# Patient Record
Sex: Female | Born: 1965 | Race: White | Hispanic: No | Marital: Married | Smoking: Never smoker
Health system: Southern US, Community
[De-identification: ages and names within clinical notes are randomized; demographics above are authoritative.]

## PROBLEM LIST (undated history)

## (undated) DIAGNOSIS — M21611 Bunion of right foot: Secondary | ICD-10-CM

## (undated) DIAGNOSIS — D649 Anemia, unspecified: Secondary | ICD-10-CM

## (undated) DIAGNOSIS — I10 Essential (primary) hypertension: Secondary | ICD-10-CM

## (undated) DIAGNOSIS — R7303 Prediabetes: Secondary | ICD-10-CM

## (undated) DIAGNOSIS — Z8632 Personal history of gestational diabetes: Secondary | ICD-10-CM

## (undated) DIAGNOSIS — K219 Gastro-esophageal reflux disease without esophagitis: Secondary | ICD-10-CM

## (undated) DIAGNOSIS — E66811 Obesity, class 1: Secondary | ICD-10-CM

## (undated) DIAGNOSIS — I8390 Asymptomatic varicose veins of unspecified lower extremity: Secondary | ICD-10-CM

## (undated) DIAGNOSIS — E669 Obesity, unspecified: Secondary | ICD-10-CM

## (undated) DIAGNOSIS — K8020 Calculus of gallbladder without cholecystitis without obstruction: Secondary | ICD-10-CM

## (undated) HISTORY — DX: Essential (primary) hypertension: I10

## (undated) HISTORY — DX: Calculus of gallbladder without cholecystitis without obstruction: K80.20

## (undated) HISTORY — PX: BREAST CYST ASPIRATION: SHX578

## (undated) HISTORY — DX: Personal history of gestational diabetes: Z86.32

## (undated) HISTORY — PX: TOTAL ABDOMINAL HYSTERECTOMY W/ BILATERAL SALPINGOOPHORECTOMY: SHX83

## (undated) HISTORY — PX: BUNIONECTOMY: SHX129

---

## 1999-08-27 ENCOUNTER — Other Ambulatory Visit: Admission: RE | Admit: 1999-08-27 | Discharge: 1999-08-27 | Payer: Self-pay | Admitting: Gynecology

## 2000-10-24 ENCOUNTER — Other Ambulatory Visit: Admission: RE | Admit: 2000-10-24 | Discharge: 2000-10-24 | Payer: Self-pay | Admitting: Gynecology

## 2003-11-03 ENCOUNTER — Other Ambulatory Visit: Admission: RE | Admit: 2003-11-03 | Discharge: 2003-11-03 | Payer: Self-pay | Admitting: Gynecology

## 2006-04-23 ENCOUNTER — Other Ambulatory Visit: Admission: RE | Admit: 2006-04-23 | Discharge: 2006-04-23 | Payer: Self-pay | Admitting: Gynecology

## 2009-03-15 ENCOUNTER — Ambulatory Visit: Payer: Self-pay | Admitting: General Practice

## 2009-05-10 ENCOUNTER — Ambulatory Visit: Payer: Self-pay | Admitting: General Surgery

## 2009-05-12 ENCOUNTER — Ambulatory Visit: Payer: Self-pay | Admitting: Gynecology

## 2009-05-12 ENCOUNTER — Other Ambulatory Visit: Admission: RE | Admit: 2009-05-12 | Discharge: 2009-05-12 | Payer: Self-pay | Admitting: Gynecology

## 2009-10-17 ENCOUNTER — Ambulatory Visit: Payer: Self-pay

## 2010-01-22 ENCOUNTER — Ambulatory Visit: Payer: Self-pay | Admitting: Gynecology

## 2010-09-28 ENCOUNTER — Ambulatory Visit: Payer: Self-pay | Admitting: Internal Medicine

## 2010-10-08 ENCOUNTER — Encounter: Payer: Self-pay | Admitting: *Deleted

## 2010-10-10 ENCOUNTER — Ambulatory Visit: Payer: Self-pay | Admitting: Internal Medicine

## 2010-10-15 ENCOUNTER — Institutional Professional Consult (permissible substitution): Payer: Self-pay | Admitting: Gynecology

## 2010-10-16 ENCOUNTER — Encounter: Payer: Self-pay | Admitting: Gynecology

## 2010-10-16 ENCOUNTER — Ambulatory Visit (INDEPENDENT_AMBULATORY_CARE_PROVIDER_SITE_OTHER): Payer: PRIVATE HEALTH INSURANCE | Admitting: Gynecology

## 2010-10-16 DIAGNOSIS — N920 Excessive and frequent menstruation with regular cycle: Secondary | ICD-10-CM

## 2010-10-16 DIAGNOSIS — D500 Iron deficiency anemia secondary to blood loss (chronic): Secondary | ICD-10-CM

## 2010-10-16 NOTE — Progress Notes (Signed)
Patient presents in reference to her anemia. She has a long history of iron deficiency anemia which she has been noted to be in the 7 hemoglobin range before. We have talked previously of various options and I have suggested we start with sonohysterogram when I saw her a year ago March but she never followed up for this. She now sees her primary who ran some studies show a hemoglobin of 8.5, ferritin level of 4, iron level of 16 all of which are low and iron-binding capacity and 434 which is upper limits of normal. All of this is consistent with her menorrhagia and her iron deficiency anemia. She is on high-dose oral iron supplements.  I saw her a year ago March her uterus was irregular consistent with leiomyoma but again she never followed up for a sonohysterogram.  Exam: Abdomen soft nontender without masses guarding rebound organomegaly Pelvic external BUS vagina normal cervix normal uterus enlarged approximately 10 week size somewhat globally difficult to define nontender adnexa without gross masses or tenderness.  Assessment and plan: Menorrhagia, significant iron deficiency anemia, exam consistent with leiomyoma although questionable adnexal process difficult to feel the uterus outline. Recommend we start with sonohysterogram for better definition rule out intracavitary abnormalities. They're not consistently using contraception again reviewed the issues to include failure with pregnancy.  I discussed menorrhagia options to include low-dose oral contraceptives on a continuous three-month withdrawal process. Mirena IUD, endometrial ablation, hysterectomy were all reviewed. Pros and cons of each choice were discussed the issues of low-dose oral contraceptives to include the risks of stroke heart attack DVT were reviewed. Patient will think of her options and she'll schedule the sonohysterogram and a followup with me for this and then further discussion.

## 2010-11-16 ENCOUNTER — Ambulatory Visit: Payer: Self-pay | Admitting: Internal Medicine

## 2010-11-29 ENCOUNTER — Ambulatory Visit: Payer: PRIVATE HEALTH INSURANCE | Admitting: Gynecology

## 2010-11-29 ENCOUNTER — Other Ambulatory Visit: Payer: PRIVATE HEALTH INSURANCE

## 2010-12-10 ENCOUNTER — Ambulatory Visit: Payer: Self-pay | Admitting: Internal Medicine

## 2011-10-18 ENCOUNTER — Encounter: Payer: Self-pay | Admitting: Gynecology

## 2011-10-18 ENCOUNTER — Ambulatory Visit (INDEPENDENT_AMBULATORY_CARE_PROVIDER_SITE_OTHER): Payer: PRIVATE HEALTH INSURANCE | Admitting: Gynecology

## 2011-10-18 DIAGNOSIS — N6009 Solitary cyst of unspecified breast: Secondary | ICD-10-CM

## 2011-10-18 NOTE — Progress Notes (Signed)
Patient ID: Shannon Marsh, female   DOB: [birth year], 46 y.o.   MRN: [id number] Patient presents having felt a tender lump possibly two in her left breast over the last week or so.  She does have a history of prior aspiration in this region 2 years ago.  Last mammogram through work approximately 2 years ago.  Was seen one year ago due to heavier menses it was recommended for ultrasound noting an enlarged uterus consistent with leiomyoma but she never followed up for this.    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 Right breast without masses retractions discharge adenopathy. Left breast with 2 palpable masses 2:00 position 2 fingerbreadths above the areola. First mass 2 cm, second mass 1 cm No overlying skin changes, nipple discharge, axillary adenopathy. Physical Exam  Pulmonary/Chest:     Procedure: Skin overlying the palpable masses was cleansed with alcohol, infiltrated with 1% lidocaine and both areas were entered separately and aspirated with clear yellow fluid completely emptying both cysts. There was no residual palpable abnormalities. Fluid was discarded.  Assessment and plan: 1. Breast cysts. Both areas were aspirated and emptied. Patient will continue self breast exams. As long as these areas remain gone and will follow. She's due for mammogram now she knows to schedule this. 2. Annual exam. Patient is overdue for her annual exam she knows to schedule this and agrees to do so today. We will discuss her menorrhagia history and the need for ultrasound at her annual exam and also it will give me a chance to reexamine her breasts to make sure that these areas remain gone.

## 2011-10-18 NOTE — Patient Instructions (Signed)
Follow up for annual exam. Schedule your mammogram.

## 2011-11-20 ENCOUNTER — Encounter: Payer: Self-pay | Admitting: Gynecology

## 2011-11-20 ENCOUNTER — Ambulatory Visit (INDEPENDENT_AMBULATORY_CARE_PROVIDER_SITE_OTHER): Payer: PRIVATE HEALTH INSURANCE | Admitting: Gynecology

## 2011-11-20 ENCOUNTER — Other Ambulatory Visit (HOSPITAL_COMMUNITY)
Admission: RE | Admit: 2011-11-20 | Discharge: 2011-11-20 | Disposition: A | Discharge status: Home / Self Care | Payer: PRIVATE HEALTH INSURANCE | Source: Ambulatory Visit | Attending: Gynecology | Admitting: Gynecology

## 2011-11-20 VITALS — BP 112/70 | Ht 64.5 in | Wt 150.0 lb

## 2011-11-20 DIAGNOSIS — N926 Irregular menstruation, unspecified: Secondary | ICD-10-CM

## 2011-11-20 DIAGNOSIS — Z1151 Encounter for screening for human papillomavirus (HPV): Secondary | ICD-10-CM | POA: Insufficient documentation

## 2011-11-20 DIAGNOSIS — Z01419 Encounter for gynecological examination (general) (routine) without abnormal findings: Secondary | ICD-10-CM | POA: Insufficient documentation

## 2011-11-20 DIAGNOSIS — D259 Leiomyoma of uterus, unspecified: Secondary | ICD-10-CM

## 2011-11-20 DIAGNOSIS — N920 Excessive and frequent menstruation with regular cycle: Secondary | ICD-10-CM

## 2011-11-20 LAB — CBC WITH DIFFERENTIAL/PLATELET
Basophils Absolute: 0 10*3/uL (ref 0.0–0.1)
Eosinophils Relative: 2 % (ref 0–5)
Lymphocytes Relative: 18 % (ref 12–46)
MCV: 65.5 fL — ABNORMAL LOW (ref 78.0–100.0)
Neutrophils Relative %: 70 % (ref 43–77)
Platelets: 528 10*3/uL — ABNORMAL HIGH (ref 150–400)
RDW: 19.7 % — ABNORMAL HIGH (ref 11.5–15.5)
WBC: 4.9 10*3/uL (ref 4.0–10.5)

## 2011-11-20 NOTE — Progress Notes (Signed)
Shannon Marsh 23-Mar-1965 [id number]        46 y.o.  G2P2 for annual exam.  Several issues noted below.  Past medical history,surgical history, medications, allergies, family history and social history were all reviewed and documented in the EPIC chart. ROS:  Was performed and pertinent positives and negatives are included in the history.  Exam: Shannon Marsh assistant Filed Vitals:   11/20/11 0948  BP: 112/70  Height: 5' 4.5" (1.638 m)  Weight: 150 lb (68.04 kg)   General appearance  Normal Skin grossly normal Head/Neck normal with no cervical or supraclavicular adenopathy thyroid normal Lungs  clear Cardiac RR, without RMG Abdominal  soft, nontender, without masses, organomegaly or hernia Breasts  examined lying and sitting without masses, retractions, discharge or axillary adenopathy. Pelvic  Ext/BUS/vagina  normal   Cervix  normal Pap/HPV  Uterus  10 weeks size, irregular posterior in location  Adnexa  Without masses or tenderness    Anus and perineum  normal   Rectovaginal  normal sphincter tone without palpated masses or tenderness.    Assessment/Plan:  46 y.o. G2P2 female for annual exam.   1. Menorrhagia/iron deficiency anemia/irregular uterus consistent with leiomyoma. We again discussed her history of significant iron deficiency and heavy menses. She now starting to have bleeding in between her menses. Uterus appears to have enlarged on exam. Will check baseline CBC TSH sonohysterogram. I again strongly urged her to consider intervention. Options to include hormonal manipulation, Mirena IUD, endometrial ablation, uterine artery embolization, myomectomy and hysterectomy.  The pros/cons, risks/benefits of each choice were reviewed. If leiomyoma with normal cavity strongly recommended at least consider Mirena IUD patient also needs contraception or hysterectomy. If irregular cavity possible hysteroscopic resection or begin hysterectomy. Patient will follow up for labs and  ultrasound and we'll further discuss. 2. Contraception. She continues to use withdrawal method. We have discussed this multiple times and she understands the failure risk. I strongly suggested a more consistent contraception and as per above I think IUD would address several issues. We'll further discuss after ultrasound. 3. Mammography. Patient is overdue for mammogram and knows to schedule this now agrees to do so. SBE monthly reviewed. 4. Pap smear. Pap/HPV done. Assuming negative with no history of abnormal Pap smears previously will plan every 5 year screening. 5. Health maintenance. No other blood work done that she has glucose cholesterol and other routine blood work through her work. Follow up for sonohysterogram and lab results per above.    Dara Lords MD, 10:37 AM 11/20/2011

## 2011-11-20 NOTE — Patient Instructions (Addendum)
Leiomyomata (Fibroids)  What are fibroids? - Fibroids are tough balls of muscle that form in the uterus. The uterus, also called the womb, is the part of the woman's body that holds a baby if she is pregnant. People sometimes refer to fibroids as "tumors." But fibroids are not a form of cancer. They are simply abnormal growths in the muscle of the uterus. What are the symptoms of fibroids? - Fibroids often cause no symptoms at all. When they do cause symptoms, they can cause: Heavy periods  Pain, pressure, or a feeling of "fullness" in the belly  The need to urinate often  Too few bowel movements (constipation)  Difficulty getting pregnant How are fibroids treated? - There are several treatment options. Each option has its own pros and cons. The right treatment for you will depend on: The symptoms you have  Your age (because most fibroids shrink or stop causing symptoms after menopause)  Whether you are done having children  Whether your fibroids cause so much bleeding that you have a condition called anemia  The size, number, and location of your fibroids  How you feel about the risks and benefits of the different options  If you are thinking about treatment, ask your doctor or nurse which treatments might help you. Then ask what the risks and benefits of those options are. Ask, too, what happens if you do NOT have treatment. And be sure to mention whether or not you would like to have children.  Here are the options: Medicines - The pills, patches, vaginal rings, injections, and implants used for birth control can all reduce how much you bleed during your period. A device known as the intrauterine device, or IUD, can also make your periods lighter. There are also other medicines that can reduce the amount a woman bleeds during her period. If bleeding is your main symptom, birth control methods or medicines might help you.  Surgery to remove the fibroids (doctors call this surgery "myomectomy")  - During this operation, the doctor removes the fibroids but leaves the uterus in place. It is effective, but it is not always a permanent fix, because fibroids can come back. Myomectomy is often a good choice for women who may want (more) children.  Treatment to destroy the lining of the uterus (doctors call this procedure "endometrial ablation") - During this procedure, the doctor inserts a thin tube into the vagina, through the cervix and into the uterus. Then he or she uses tools inserted through that tube to destroy the lining of the uterus. This procedure reduces bleeding from heavy periods. But it is not an option for all women. It is also not appropriate for women who may want to get pregnant.  Treatment to cut off the blood supply to the fibroids (doctors call this procedure "uterine artery embolization" or "uterine fibroid embolization") - During this procedure, the doctor inserts a thin tube into an artery in the leg and threads it up to the uterus. Then he or she uses tiny plastic beads to block the artery that brings blood to the fibroid. After the procedure, the fibroid no longer gets blood, so it shrinks and dies off. This procedure is not appropriate for women who may want to get pregnant.  Surgery to remove the uterus (Doctors call this surgery "hysterectomy") - This surgery gets rid of fibroids and the problems they cause forever. Women who have this surgery cannot have fibroids come back. But they can never bear children.  How do  I choose which option is right for me? - You work with your doctor to understand how the different treatment options would affect you. Then the two of you work together to choose the option that's right for you. For women who may still want to have children, medicines or myomectomy is often the best choice. Women who do not want to have (more) children can often choose from all the options. They need to consider how invasive each surgery is and whether they prefer  surgery over taking medicines. One thing to consider is that fibroid-related symptoms often go away with menopause.     Call to Schedule your mammogram  Facilities in [place]: 1)  [facility] [place], [place] Phone: [phone] 2)  [facility], [address] [id number] Phone: [phone] 3)  Dr. Yolanda Bonine at [facility] [address] Phone: [phone]     Mammogram A mammogram is an X-ray test to find changes in a woman's breast. You should get a mammogram if:  You are 93 years of age or older  You have risk factors.   Your doctor recommends that you have one.  BEFORE THE TEST  Do not schedule the test the week before your period, especially if your breasts are sore during this time.  On the day of your mammogram:  Wash your breasts and armpits well. After washing, do not put on any deodorant or talcum powder on until after your test.   Eat and drink as you usually do.   Take your medicines as usual.   If you are diabetic and take insulin, make sure you:   Eat before coming for your test.   Take your insulin as usual.   If you cannot keep your appointment, call before the appointment to cancel. Schedule another appointment.  TEST  You will need to undress from the waist up. You will put on a hospital gown.   Your breast will be put on the mammogram machine, and it will press firmly on your breast with a piece of plastic called a compression paddle. This will make your breast flatter so that the machine can X-ray all parts of your breast.   Both breasts will be X-rayed. Each breast will be X-rayed from above and from the side. An X-ray might need to be taken again if the picture is not good enough.   The mammogram will last about 15 to 30 minutes.  AFTER THE TEST Finding out the results of your test Ask when your test results will be ready. Make sure you get your test  results.  Document Released: 05/24/2008 Document Revised: 02/14/2011 Document Reviewed: 05/24/2008 [facility] Patient Information 2012 [place], [place].

## 2011-11-21 LAB — URINALYSIS W MICROSCOPIC + REFLEX CULTURE
Bacteria, UA: NONE SEEN
Bilirubin Urine: NEGATIVE
Ketones, ur: NEGATIVE mg/dL
Protein, ur: NEGATIVE mg/dL
Squamous Epithelial / LPF: NONE SEEN
Urobilinogen, UA: 0.2 mg/dL (ref 0.0–1.0)

## 2011-12-16 ENCOUNTER — Other Ambulatory Visit: Payer: Self-pay | Admitting: Gynecology

## 2011-12-16 ENCOUNTER — Ambulatory Visit (INDEPENDENT_AMBULATORY_CARE_PROVIDER_SITE_OTHER): Payer: PRIVATE HEALTH INSURANCE | Admitting: Gynecology

## 2011-12-16 ENCOUNTER — Encounter: Payer: Self-pay | Admitting: Gynecology

## 2011-12-16 ENCOUNTER — Ambulatory Visit (INDEPENDENT_AMBULATORY_CARE_PROVIDER_SITE_OTHER): Payer: PRIVATE HEALTH INSURANCE

## 2011-12-16 DIAGNOSIS — N920 Excessive and frequent menstruation with regular cycle: Secondary | ICD-10-CM

## 2011-12-16 DIAGNOSIS — N852 Hypertrophy of uterus: Secondary | ICD-10-CM

## 2011-12-16 DIAGNOSIS — D259 Leiomyoma of uterus, unspecified: Secondary | ICD-10-CM

## 2011-12-16 DIAGNOSIS — D649 Anemia, unspecified: Secondary | ICD-10-CM

## 2011-12-16 DIAGNOSIS — N926 Irregular menstruation, unspecified: Secondary | ICD-10-CM

## 2011-12-16 DIAGNOSIS — D251 Intramural leiomyoma of uterus: Secondary | ICD-10-CM

## 2011-12-16 DIAGNOSIS — N8300 Follicular cyst of ovary, unspecified side: Secondary | ICD-10-CM

## 2011-12-16 DIAGNOSIS — D252 Subserosal leiomyoma of uterus: Secondary | ICD-10-CM

## 2011-12-16 DIAGNOSIS — N831 Corpus luteum cyst of ovary, unspecified side: Secondary | ICD-10-CM

## 2011-12-16 NOTE — Progress Notes (Signed)
Patient presents for sonohysterogram due to menorrhagia. Her lab work showed a significant anemia with hemoglobin of 7 which is similar to past results. She was called and currently is on extra iron twice daily.  Ultrasound shows overall uterine size enlarged at 14 x 10 x 10 cm. Endometrial echo 16.5 mm. Multiple intramural subserosal myomas noted the largest measuring 72 mm. Right left ovaries visualized with physiologic changes. Cul-de-sac negative. Sonohysterogram performed, sterile technique, easy catheter introduction, good distention with no defects noted. Some mild distortion from intramural myomas noted. Endometrial sample taken. Patient tolerated well.  Reviewed situation with the patient and her husband to include her significant anemia. She is on her iron twice daily and will continue on this. Options for management were reviewed to include observation, hormonal ablation, Mirena IUD, attempted ablation, multiple myomectomy, uterine artery embolization, hysterectomy. The pros/cons, risks/benefits of each choice were reviewed in detail. The risks of failure of the more conservative trials with ultimate hysterectomy discussed versus higher risk with hysterectomy. Uterine artery embolization/multiple myomectomy possibilities with risk of recurrence as well as risk of the procedures. Hysterectomy with definitive results as far as bleeding and recurrence. Would recommend preoperative course of Depo-Lupron to allow for hemoglobin recovery as well as uterine shrinkage to improve minimally invasive success. If preoperative exam appropriate would suggest attempted [facility] given to prior vaginal deliveries with largest 8 pounds. Otherwise LAVH/TLH. Patient will follow up for her biopsy results and will discuss with her husband at home and follow up with her decision. Regardless she needs to stay on her iron and she understands importance of doing so. I did review the side effect profile of Depo-Lupron with them to  include hypo-estrogenic symptoms as well as accelerated bone loss.

## 2011-12-16 NOTE — Patient Instructions (Signed)
Office will call you with biopsy results and we will further discuss your treatment decision.

## 2011-12-18 ENCOUNTER — Telehealth: Payer: Self-pay | Admitting: Gynecology

## 2011-12-18 NOTE — Telephone Encounter (Signed)
Message copied by Keenan Bachelor on Wed Dec 18, 2011  3:14 PM ------      Message from: Dara Lords      Created: Tue Dec 17, 2011  5:05 PM       Tell patient that her biopsy from the ultrasound is normal. She needs to let us know what she is thinking about as far as where she wants to go as far as her heavy bleeding. We discussed a lot of options and if she has any questions I would be glad to talk to her.

## 2011-12-18 NOTE — Telephone Encounter (Signed)
Patient is ready to plan for Hysterectomy in January.  She said you wanted her to have Lupron injection first.  Please advise regarding orders for Lupron and surgery.

## 2011-12-19 NOTE — Telephone Encounter (Signed)
done

## 2011-12-24 ENCOUNTER — Telehealth: Payer: Self-pay | Admitting: Gynecology

## 2011-12-24 NOTE — Telephone Encounter (Signed)
Patient called inquiring regarding Lupron approval.  I told her I faxed form and only yesterday did I receive a fax review of her benefits.  I told her it was my understanding that she will get a phone call from [employer] explaining her benefits and telling her how much she will have to pay.  She can then let them know if she wants them to ship it.  I told her I will be out Th-Fr so if she hasn't heard from them by Monday to call me then.

## 2012-01-03 ENCOUNTER — Telehealth: Payer: Self-pay | Admitting: Gynecology

## 2012-01-03 NOTE — Telephone Encounter (Signed)
Patient informed that Amy at [facility] called and they now confirm no prior auth required for Lupron and said they will be in touch with patient for permission to ship.

## 2012-01-03 NOTE — Telephone Encounter (Signed)
Patient called to see if I had found out anything about her Lupron.  I told her I had attempted to do prior auth twice this week and on 10/22 and 10/24 received faxed response from [facility] saying no prior auth required but [employer] continues to insist prior auth IS required. Per the CSR Amy, at [facility] they are checking into it and will be calling me. I will keep patient informed.

## 2012-01-06 ENCOUNTER — Telehealth: Payer: Self-pay | Admitting: Gynecology

## 2012-01-06 NOTE — Telephone Encounter (Signed)
Patient's Lupron [place] has finally arrived. Meantime she had her menses week of Oct 21st.  Patient thought she was to get the injection with her menses and asks does she have to wait on another period. She is anxious to get this injection and move forward to her surgery.

## 2012-01-06 NOTE — Telephone Encounter (Signed)
The ideal time to receive it is during her period. The issue with her would be the question about pregnancy. If she has had intercourse since her last period that would be not wise to receive the shot. If she has not had intercourse and can guarantee she's not pregnant then she can go and get a shot now.

## 2012-01-06 NOTE — Telephone Encounter (Signed)
Left message for patient to call me

## 2012-01-07 NOTE — Telephone Encounter (Signed)
Left message for patient to call.

## 2012-01-07 NOTE — Telephone Encounter (Signed)
I read patient Dr. Kristie Cowman recommendations below.  Patient said that she and husband have abstained from intercourse since LMP because she knew that this would be important to be able to get the Lupron injection when it arrived.  We scheduled her a nurse appt for Friday to have her injection.  She wants to schedule her surgery for mid January and I told her I will call her as soon as I receive the January schedule.

## 2012-01-10 ENCOUNTER — Ambulatory Visit (INDEPENDENT_AMBULATORY_CARE_PROVIDER_SITE_OTHER): Payer: PRIVATE HEALTH INSURANCE | Admitting: Anesthesiology

## 2012-01-10 DIAGNOSIS — D649 Anemia, unspecified: Secondary | ICD-10-CM

## 2012-01-10 DIAGNOSIS — N920 Excessive and frequent menstruation with regular cycle: Secondary | ICD-10-CM

## 2012-01-10 DIAGNOSIS — D259 Leiomyoma of uterus, unspecified: Secondary | ICD-10-CM

## 2012-01-10 MED ORDER — LEUPROLIDE ACETATE (3 MONTH) 11.25 MG IM KIT
11.2500 mg | PACK | Freq: Once | INTRAMUSCULAR | Status: AC
  Administered 2012-01-10: 11.25 mg via INTRAMUSCULAR

## 2012-01-27 ENCOUNTER — Telehealth: Payer: Self-pay | Admitting: Gynecology

## 2012-01-27 NOTE — Telephone Encounter (Signed)
Not unusual to have a flare effect type bleeding.  As long as it is getting better and going away I would watch it. We could give her progesterone to help quicken it but it should get better on its own.

## 2012-01-27 NOTE — Telephone Encounter (Signed)
Left message for patient to call me

## 2012-01-27 NOTE — Telephone Encounter (Signed)
Patient received Lupron inj 01/10/12.  She started bleeding on Friday, Nov 15 and continues to bleed.  She said Friday night and Saturday were heavy like her periods have been being but then it slowed down and is lighter than usual.  She said it was the exact day she had anticipated her period to start based on her menstrual calendar.  She thought she should not have bleeding with Lupron and wanted me to check with Dr. Velvet Bathe.

## 2012-01-28 NOTE — Telephone Encounter (Signed)
Patient advised. I will call her soon with January schedule to set up her surgery.

## 2012-02-05 ENCOUNTER — Telehealth: Payer: Self-pay | Admitting: Gynecology

## 2012-02-05 NOTE — Telephone Encounter (Signed)
I called patient to let her know I have January schedule. We scheduled her for Jan 13 7:30am at [facility] and she will see Dr. Velvet Bathe for pre-op consult on Dec 20 at 2:00pm.  She has FMLA forms she will need me to complete and we discussed that four weeks out of work is a good average time to use on these forms and Dr. Velvet Bathe will just see how her post operative recovery goes.

## 2012-02-13 ENCOUNTER — Telehealth: Payer: Self-pay | Admitting: Gynecology

## 2012-02-13 MED ORDER — MEGESTROL ACETATE 20 MG PO TABS: 20.0000 mg | ORAL_TABLET | Freq: Every day | ORAL | Status: DC

## 2012-02-13 NOTE — Telephone Encounter (Signed)
Patient informed. 

## 2012-02-13 NOTE — Telephone Encounter (Signed)
Had Lupron injection on 01/10/12 and called after that regarding bleeding.  Patient states she has continued to bleed.  Last two weeks a little more than spotting and will sometimes seem like it is going to stop that is why she has hesitated calling.  She just wanted to see if any need for concern or need to do anything about it?

## 2012-02-13 NOTE — Telephone Encounter (Signed)
Megace 20 mg #14 one by mouth daily and we'll see if this doesn't stop the bleeding.

## 2012-02-19 ENCOUNTER — Telehealth: Payer: Self-pay | Admitting: Gynecology

## 2012-02-19 MED ORDER — MEGESTROL ACETATE 20 MG PO TABS: 20.0000 mg | ORAL_TABLET | Freq: Two times a day (BID) | ORAL | Status: DC

## 2012-02-19 NOTE — Telephone Encounter (Signed)
Increase Megace to 20 mg twice a day. Office visit if bleeding continues

## 2012-02-19 NOTE — Telephone Encounter (Signed)
Started Megace 20 mg one daily on Friday. Continued to spot daily. Yesterday evening began with regular flow like menses.  Increased as the evening proceeded to using a pad and a tampon and with clots. Today regular flow.   Patient did not take Megace last night but will take it now. Rec?  Also, patient has health clinic at work that can do lab work. She wondered if you might want them to check her iron level.  She said it was low and you might want to see if it has improved.

## 2012-02-19 NOTE — Telephone Encounter (Signed)
Dr. Velvet Bathe- Please see patient's second question below regarding having iron level checked at work? Pls advise. Thx

## 2012-02-19 NOTE — Telephone Encounter (Signed)
Am not sure is necessary at this point as I would not do anything different but then try to stop her bleeding. If she continues to bleed she will come in this office and we'll reevaluate her and check her blood counts then.

## 2012-02-19 NOTE — Telephone Encounter (Signed)
Patient informed. 

## 2012-02-23 ENCOUNTER — Encounter (HOSPITAL_COMMUNITY): Payer: Self-pay | Admitting: *Deleted

## 2012-02-23 ENCOUNTER — Inpatient Hospital Stay (HOSPITAL_COMMUNITY)
Admission: AD | Admit: 2012-02-23 | Discharge: 2012-02-23 | Disposition: A | Discharge status: Home / Self Care | Payer: PRIVATE HEALTH INSURANCE | Source: Ambulatory Visit | Attending: Gynecology | Admitting: Gynecology

## 2012-02-23 DIAGNOSIS — N938 Other specified abnormal uterine and vaginal bleeding: Secondary | ICD-10-CM | POA: Insufficient documentation

## 2012-02-23 DIAGNOSIS — D259 Leiomyoma of uterus, unspecified: Secondary | ICD-10-CM

## 2012-02-23 DIAGNOSIS — N949 Unspecified condition associated with female genital organs and menstrual cycle: Secondary | ICD-10-CM | POA: Insufficient documentation

## 2012-02-23 DIAGNOSIS — IMO0002 Reserved for concepts with insufficient information to code with codable children: Secondary | ICD-10-CM

## 2012-02-23 HISTORY — DX: Anemia, unspecified: D64.9

## 2012-02-23 LAB — CBC
HCT: 27.4 % — ABNORMAL LOW (ref 36.0–46.0)
Hemoglobin: 8.9 g/dL — ABNORMAL LOW (ref 12.0–15.0)
MCH: 28.1 pg (ref 26.0–34.0)
MCHC: 32.5 g/dL (ref 30.0–36.0)

## 2012-02-23 LAB — SAMPLE TO BLOOD BANK

## 2012-02-23 MED ORDER — MEGESTROL ACETATE 20 MG PO TABS: 20.0000 mg | ORAL_TABLET | Freq: Three times a day (TID) | ORAL | Status: DC

## 2012-02-23 NOTE — ED Notes (Signed)
46 year old G2 P2 female with long history of leiomyoma and menorrhagia.  The patient is known to have a hemoglobin of 7 over the past year and most recently was evaluated with ultrasound showing an enlarged uterus measuring 14 x 10 x 10 cm with multiple myomas. She was put on on Depo-Lupron 01/10/2012 for menstrual suppression and leiomyoma shrinkage in anticipation for hysterectomy which is scheduled for 03/23/2012. Patient started bleeding more consistently the beginning of December was begun on Megace 20 mg daily which most recently has been increased to 2 times daily. She notes over the last 24 hours several episodes of passage of clots where she felt lightheaded and ultimately presented to the emergency room.  Exam with nurse assistant Pulse 88 blood pressure 125/81 Pale appearing in no apparent distress HEENT normal Lungs clear bilaterally Cardiac regular rate no rubs murmurs or gallops Abdomen soft nontender without masses guarding rebound organomegaly Pelvic external BUS vagina with light menses flow clot in the vagina cleared without persistent active bleeding. Cervix normal closed. Uterus bulky 16 week size midline mobile minimal tenderness. Adnexa without gross masses or tenderness  Hemoglobin 11/20/2011  7.0 02/23/2012  8.9  Assessment and plan: Leiomyoma with chronic menorrhagia running in the 7 range of the past year with current hemoglobin 8.9.  Initial feelings of dizziness today after a flare of increased bleeding. Her bleeding now has decreased and she reports feeling better without dizziness after ambulating in the hospital with her husband. Options for management to include transfusion tonight with subsequent hysterectomy within 24 hours versus observation increasing Megace to 20 mg 3 times a day and follow at present reviewed. She is now 6 weeks out from her Depo-Lupron and hopefully this will be taking a maximum effect as far as menstrual suppression.   At this point they want  to go home and actually have an appointment scheduled to see me this week preoperatively and will follow up for that appointment. Will increase her Megace to 20 mg 3 times daily. ASAP call precautions reviewed and she was encouraged to call us if she has any questions whatsoever and if need be we will accelerate her surgery.

## 2012-02-23 NOTE — MAU Note (Addendum)
Received Lupron depo injection in early Novemeber.  Did not stop bleeding, was started on Megace, initially one dly, bleeding was getting worse, upped to BID.  Has been anemic, taking iron; is scheduled for a hysterectomy in Jan 13.    Had spotting in between periods, period started on 12/10; then got heavy last night- flooding following a painful b.m.

## 2012-02-23 NOTE — MAU Note (Signed)
Really bad cramping and pain last night, has eased off, but the heavy bleeding continues, has passed some clots. Soaking through tampon and pad every 1-2 hrs.

## 2012-02-23 NOTE — MAU Provider Note (Signed)
CC: Vaginal Bleeding    First Provider Initiated Contact with Patient 02/23/12 1909      HPI Shannon Marsh is a 46 y.o. [id number] with heavy vaginal bleeding since last night. She is orthostatic and feels out of breath after walking only a few steps. She is scheduled for TAH for tx of  fibroids and menorrhagia on January 19. She received Lupron in early November following a menstrual period since then she had light bleeding until menses began at the usual time last week. Last night after her bowel movement she had heavier flow and was using 3 pads with tampons an hour.Today she is changing 1 pad with tampon/hr. She is on Megace 20 mg a day and iron supplementation. Pelvic US 12/16/2011 showed intramural and subserosal fibroid measuring approx. 5 x 4, 7 x 6, 4 x 4, 4 x 4 cm. September 2013 hgb 7.0.   Past Medical History  Diagnosis Date  . Gall stones   . Hx gestational diabetes   . Leiomyoma   . Gestational diabetes     with gest  . Anemia   . Fibroid     OB History    Grav Para Term Preterm Abortions TAB SAB Ect Mult Living   2 2 2       2      # Outc Date GA Lbr Len/2nd Wgt Sex Del Anes PTL Lv   1 TRM      SVD      2 TRM      SVD   Yes      Past Surgical History  Procedure Date  . No past surgeries     History   Social History  . Marital Status: Married    Spouse Name: N/A    Number of Children: N/A  . Years of Education: N/A   Occupational History  . Not on file.   Social History Main Topics  . Smoking status: Never Smoker   . Smokeless tobacco: Never Used  . Alcohol Use: Yes     Comment: occassional  . Drug Use: No  . Sexually Active: Not Currently -- Female partner(s)    Birth Control/ Protection: None   Other Topics Concern  . Not on file   Social History Narrative  . No narrative on file    No current facility-administered medications on file prior to encounter.   Current Outpatient Prescriptions on File Prior to Encounter  Medication Sig  Dispense Refill  . iron polysaccharides (NIFEREX) 150 MG capsule Take 150 mg by mouth 2 (two) times daily.        . megestrol (MEGACE) 20 MG tablet Take 1 tablet (20 mg total) by mouth 2 (two) times daily.  20 tablet  1  . vitamin C (ASCORBIC ACID) 500 MG tablet Take 500 mg by mouth 2 (two) times daily.          No Known Allergies  ROS Pertinent items in HPI  PHYSICAL EXAM Filed Vitals:   02/23/12 1819  BP: 138/86  Pulse: 107  Temp: 99.1 F (37.3 C)  Resp: 24  Orthostatic VS: pending  General: Well nourished, well developed female appears pale in no acute distress Cardiovascular: RRR without murmur Respiratory: CTA bilat Abdomen: Soft, mildly tender over uterus, fibroids palpable Back: No CVAT Extremities: No edema Neurologic: Alert and oriented Speculum exam: NEFG; vagina with physiologic discharge, moderately heavy bleeding; cervix clean Bimanual exam: cervix closed, no CMT; uterus nodular16 wk size; no adnexal tenderness or masses  LAB RESULTS Results for orders placed during the hospital encounter of 02/23/12 (from the past 24 hour(s))  CBC     Status: Abnormal   Collection Time   02/23/12  6:53 PM      Component Value Range   WBC 8.5  4.0 - 10.5 K/uL   RBC 3.17 (*) 3.87 - 5.11 MIL/uL   Hemoglobin 8.9 (*) 12.0 - 15.0 g/dL   HCT 09.8 (*) 11.9 - 14.7 %   MCV 86.4  78.0 - 100.0 fL   MCH 28.1  26.0 - 34.0 pg   MCHC 32.5  30.0 - 36.0 g/dL   RDW 82.9  56.2 - 13.0 %   Platelets 259  150 - 400 K/uL     MAU COURSE   ASSESSMENT  1. Leiomyoma of uterus     PLAN C/W Dr. Lomax> Dr. Audie Box to come see pt.     Medication List     As of 02/23/2012  7:10 PM    ASK your doctor about these medications         iron polysaccharides 150 MG capsule   Commonly known as: NIFEREX   Take 150 mg by mouth 2 (two) times daily.      megestrol 20 MG tablet   Commonly known as: MEGACE   Take 1 tablet (20 mg total) by mouth 2 (two) times daily.      vitamin C 500 MG  tablet   Commonly known as: ASCORBIC ACID   Take 500 mg by mouth 2 (two) times daily.       2030: Dr. Audie Box assumed care   Emalene Welte Colin Mulders, CNM 02/23/2012 7:10 PM

## 2012-02-28 ENCOUNTER — Encounter: Payer: Self-pay | Admitting: Gynecology

## 2012-02-28 ENCOUNTER — Ambulatory Visit (INDEPENDENT_AMBULATORY_CARE_PROVIDER_SITE_OTHER): Payer: PRIVATE HEALTH INSURANCE | Admitting: Gynecology

## 2012-02-28 DIAGNOSIS — D259 Leiomyoma of uterus, unspecified: Secondary | ICD-10-CM

## 2012-02-28 DIAGNOSIS — N920 Excessive and frequent menstruation with regular cycle: Secondary | ICD-10-CM

## 2012-02-28 DIAGNOSIS — D649 Anemia, unspecified: Secondary | ICD-10-CM

## 2012-02-28 NOTE — Patient Instructions (Addendum)
Followup for surgery as scheduled. 

## 2012-02-28 NOTE — Progress Notes (Signed)
Shannon Marsh Jul 09, 1965 [id number]   Preoperative consult  Chief complaint: leiomyoma, menorrhagia, significant iron deficiency anemia  History of present illness: 46 y.o. G2P2002 with long history of leiomyoma and menorrhagia. Has been running a hemoglobin of 7 and notes that her periods are getting heavier. Sonohysterogram was performed which shows multiple myomas the largest measuring 7 cm and overall uterine size 14 weeks. Cavity without significant submucous myomas or distortion. Endometrial biopsy showing secretory endometrium.  Options for management were reviewed to include hormonal manipulation, Mirena IUD, endometrial ablation, uterine artery embolization, myomectomy and hysterectomy. The pros/cons, risks/benefits of each choice were reviewed and the patient wants to proceed with hysterectomy.  Patient has been on Depo-Lupron for menstrual suppression but unfortunately has continued to bleed on and off despite this with additional Megace 20 mg 3 times a day added to ultimately suppress her bleeding.  Her uterus is not shrunk to any great degree despite Depo-Lupron and options for approach to include TVH/LAVH/TAH were reviewed. Given the bulk of the uterus and low hemoglobin it was felt most prudent to proceed with a TAH and the patient and husband agree.  Past medical history,surgical history, medications, allergies, family history and social history were all reviewed and documented in the EPIC chart. ROS:  Was performed and pertinent positives and negatives are included in the history of present illness.  Exam:  Shannon Marsh assistant General: well developed, well nourished female, no acute distress HEENT: normal  Lungs: clear to auscultation without wheezing, rales or rhonchi  Cardiac: regular rate without rubs, murmurs or gallops  Abdomen: soft, nontender without masses, guarding, rebound, organomegaly  Pelvic: external bus vagina: normal   Cervix: grossly normal  Uterus: 14-16 weeks  fills the cul-de-sac Adnexa: without masses or tenderness      Assessment/Plan:  46 y.o. [id number] with the above history admitted for TAH. The proposed surgery was reviewed with the patient and her husband to include the expected intraoperative postoperative courses. Absolute irreversible sterility associated with hysterectomy was reviewed. Sexuality following hysterectomy was also discussed and the risk for persistent orgasmic dysfunction and persistent dyspareunia was discussed with her. Ovarian conservation was reviewed and the options to remove will keep both ovaries discussed. By keeping both ovaries she will keep hormone production but also keep the risk of ovarian disease and cancer in the future as well as the risk of benign disease requiring future surgery. If we remove her ovaries the issues of symptoms from hypoestrogenism as well as accelerated cardiovascular risk osteoporosis and overall mortality risk discussed. Patient wants to keep both ovaries. She does give me permission to remove one or both ovaries if significant disease is encountered in is my best intraoperative decision to do so.  The risk of infection requiring prolonged antibiotics as well as reoperation for abscess or hematoma drainage was reviewed. Incisional complications requiring opening and draining of incisions, closure by secondary intention and long-term issues of cosmetics/keloid and hernia formation discussed. The realistic risk of transfusion given her low hemoglobin preoperatively was discussed and the risks of transfusion reaction hepatitis HIV mad cow disease and other unknown entities discussed. I do not feel she will gleam any further benefit from menstrual  Suppression as she is having breakthrough bleeding as it is and she agrees with this to proceed with surgery now. The risk of inadvertent injury to internal organs either immediately recognized or delay recognized including bladder ureters vessels and nerves  necessitating major exploratory reparative surgeries and future reparative surgeries cleaning ostomy formation bladder  repair ureteral damage repair intestinal repair was all discussed understood and accepted. The patient's questions were answered to her satisfaction she is ready to proceed with surgery.    Dara Lords MD, 2:56 PM 02/28/2012

## 2012-02-28 NOTE — H&P (Addendum)
ABI SHOULTS June 27, 1965 [id number]   History and Physical  Chief complaint: leiomyoma, menorrhagia, significant iron deficiency anemia  History of present illness: 46 y.o. G2P2002 with long history of leiomyoma and menorrhagia. Has been running a hemoglobin of 7 and notes that her periods are getting heavier. Sonohysterogram was performed which shows multiple myomas the largest measuring 7 cm and overall uterine size 14 weeks. Cavity without significant submucous myomas or distortion. Endometrial biopsy showing secretory endometrium.  Options for management were reviewed to include hormonal manipulation, Mirena IUD, endometrial ablation, uterine artery embolization, myomectomy and hysterectomy. The pros/cons, risks/benefits of each choice were reviewed and the patient wants to proceed with hysterectomy.  Patient has been on Depo-Lupron for menstrual suppression but unfortunately has continued to bleed on and off despite this with additional Megace 20 mg 3 times a day added to ultimately suppress her bleeding.  Her uterus is not shrunk to any great degree despite Depo-Lupron and options for approach to include TVH/LAVH/TAH were reviewed. Given the bulk of the uterus and low hemoglobin it was felt most prudent to proceed with a TAH and the patient and husband agree.  Past medical history,surgical history, medications, allergies, family history and social history were all reviewed and documented in the EPIC chart. ROS:  Was performed and pertinent positives and negatives are included in the history of present illness.  Exam:  [name] assistant General: well developed, well nourished female, no acute distress HEENT: normal  Lungs: clear to auscultation without wheezing, rales or rhonchi  Cardiac: regular rate without rubs, murmurs or gallops  Abdomen: soft, nontender without masses, guarding, rebound, organomegaly  Pelvic: external bus vagina: normal   Cervix: grossly normal  Uterus: 14-16 weeks  fills the cul-de-sac Adnexa: without masses or tenderness      Assessment/Plan:  46 y.o. [id number] with the above history admitted for TAH. The proposed surgery was reviewed with the patient and her husband to include the expected intraoperative postoperative courses. Absolute irreversible sterility associated with hysterectomy was reviewed. Sexuality following hysterectomy was also discussed and the risk for persistent orgasmic dysfunction and persistent dyspareunia was discussed with her. Ovarian conservation was reviewed and the options to remove or keep both ovaries discussed. By keeping both ovaries she will keep hormone production but also keep the risk of ovarian disease and cancer in the future as well as the risk of benign disease requiring future surgery. If we remove her ovaries the issues of symptoms from hypoestrogenism as well as accelerated cardiovascular risk osteoporosis and overall mortality risk discussed. Patient wants to keep both ovaries. She does give me permission to remove one or both ovaries if significant disease is encountered in is my best intraoperative decision to do so.  The risk of infection requiring prolonged antibiotics as well as reoperation for abscess or hematoma drainage was reviewed. Incisional complications requiring opening and draining of incisions, closure by secondary intention and long-term issues of cosmetics/keloid and hernia formation discussed. The realistic risk of transfusion given her low hemoglobin preoperatively was discussed and the risks of transfusion reaction hepatitis HIV mad cow disease and other unknown entities discussed. I do not feel she will gleam any further benefit from menstrual suppression as she is having breakthrough bleeding as it is and she agrees with this to proceed with surgery now. The risk of inadvertent injury to internal organs either immediately recognized or delay recognized including bowel bladder ureters vessels and nerves  necessitating major exploratory reparative surgeries and future reparative surgeries including ostomy formation  bladder repair ureteral damage repair intestinal repair was all discussed understood and accepted. The patient's questions were answered to her satisfaction she is ready to proceed with surgery.   Dara Lords MD, 3:06 PM 02/28/2012

## 2012-03-12 ENCOUNTER — Encounter: Payer: Self-pay | Admitting: Gynecology

## 2012-03-12 NOTE — Progress Notes (Unsigned)
Prior Authorization obtained from [employer].  Per Sherre Lain Prior Auth [id number].  Authorization is good for initial admit date of 03/23/12 x 2 day stay.   ka

## 2012-03-16 ENCOUNTER — Encounter (HOSPITAL_COMMUNITY): Payer: Self-pay | Admitting: Pharmacist

## 2012-03-16 ENCOUNTER — Telehealth: Payer: Self-pay | Admitting: Gynecology

## 2012-03-16 NOTE — Telephone Encounter (Signed)
Patient called.  She has TAH scheduled for Dr. Audie Box on Monday, Jan 13 7:30am.  She started yesterday with cough.  Coughing kept her awake all night.  She is afraid it is "going into her chest".  Doesn't want it to interfere with scheduled surgery.  No bodyaches or obvious fever (she is at work and has not checked temp.).   She asked what Dr. Velvet Bathe would recommend she do?

## 2012-03-16 NOTE — Telephone Encounter (Signed)
Left message on patients voice mail that I called and will try again after lunch.

## 2012-03-16 NOTE — Telephone Encounter (Signed)
She can either come by the office for me to see her tomorrow or go to an urgent care tonight or her primary tomorrow.

## 2012-03-17 ENCOUNTER — Ambulatory Visit (INDEPENDENT_AMBULATORY_CARE_PROVIDER_SITE_OTHER): Payer: PRIVATE HEALTH INSURANCE | Admitting: Gynecology

## 2012-03-17 ENCOUNTER — Encounter: Payer: Self-pay | Admitting: Gynecology

## 2012-03-17 VITALS — BP 130/88

## 2012-03-17 DIAGNOSIS — J0100 Acute maxillary sinusitis, unspecified: Secondary | ICD-10-CM

## 2012-03-17 DIAGNOSIS — R05 Cough: Secondary | ICD-10-CM

## 2012-03-17 MED ORDER — HYDROCOD POLST-CHLORPHEN POLST 10-8 MG/5ML PO LQCR: 5.0000 mL | Freq: Two times a day (BID) | ORAL | Status: DC

## 2012-03-17 MED ORDER — AMOXICILLIN-POT CLAVULANATE 875-125 MG PO TABS: 1.0000 | ORAL_TABLET | Freq: Two times a day (BID) | ORAL | Status: DC

## 2012-03-17 NOTE — Progress Notes (Signed)
Patient is a 47 year old who presented to the office today complaining of frontal sinus pressure along with occasional productive cough but mostly nonproductive. Patient feels warm but no true temperature reported. She feels run down and tired. She has some mild throat irritation but no air pressure or pain. Patient scheduled for total abdominal hysterectomy next week with my partner Dr. Audie Box as a result of her symptomatic leiomyomatous uteri.  Exam: HEENT: Tender frontal and maxillary sinuses. No oral pharyngeal lesions Neck: Supple trachea midline no submandibular lymphadenopathy. Lungs: Slightly decreased breath sounds in the upper lung fields but no true rhonchi or wheezing.  Assessment/plan: Acute sinusitis with postnasal drip contributing to cough reflex. Patient will be placed on Augmentin 875 mg twice a day for one week. She will also be placed on Tussionex 10-8 mg/5 mL every 12 hours. She continues at home her vaporizer. She scheduled for her preop appointment with the anesthesiologist this Friday and we'll see how she's doing by Monday so that hopefully her surgery will be able to be performed. Of note patient is using barrier contraception.

## 2012-03-17 NOTE — Patient Instructions (Addendum)
Sinusitis Sinusitis is redness, soreness, and swelling (inflammation) of the paranasal sinuses. Paranasal sinuses are air pockets within the bones of your face (beneath the eyes, the middle of the forehead, or above the eyes). In healthy paranasal sinuses, mucus is able to drain out, and air is able to circulate through them by way of your nose. However, when your paranasal sinuses are inflamed, mucus and air can become trapped. This can allow bacteria and other germs to grow and cause infection. Sinusitis can develop quickly and last only a short time (acute) or continue over a long period (chronic). Sinusitis that lasts for more than 12 weeks is considered chronic.  CAUSES  Causes of sinusitis include:  Allergies.  Structural abnormalities, such as displacement of the cartilage that separates your nostrils (deviated septum), which can decrease the air flow through your nose and sinuses and affect sinus drainage.  Functional abnormalities, such as when the small hairs (cilia) that line your sinuses and help remove mucus do not work properly or are not present. SYMPTOMS  Symptoms of acute and chronic sinusitis are the same. The primary symptoms are pain and pressure around the affected sinuses. Other symptoms include:  Upper toothache.  Earache.  Headache.  Bad breath.  Decreased sense of smell and taste.  A cough, which worsens when you are lying flat.  Fatigue.  Fever.  Thick drainage from your nose, which often is green and may contain pus (purulent).  Swelling and warmth over the affected sinuses. DIAGNOSIS  Your caregiver will perform a physical exam. During the exam, your caregiver may:  Look in your nose for signs of abnormal growths in your nostrils (nasal polyps).  Tap over the affected sinus to check for signs of infection.  View the inside of your sinuses (endoscopy) with a special imaging device with a light attached (endoscope), which is inserted into your  sinuses. If your caregiver suspects that you have chronic sinusitis, one or more of the following tests may be recommended:  Allergy tests.  Nasal culture A sample of mucus is taken from your nose and sent to a lab and screened for bacteria.  Nasal cytology A sample of mucus is taken from your nose and examined by your caregiver to determine if your sinusitis is related to an allergy. TREATMENT  Most cases of acute sinusitis are related to a viral infection and will resolve on their own within 10 days. Sometimes medicines are prescribed to help relieve symptoms (pain medicine, decongestants, nasal steroid sprays, or saline sprays).  However, for sinusitis related to a bacterial infection, your caregiver will prescribe antibiotic medicines. These are medicines that will help kill the bacteria causing the infection.  Rarely, sinusitis is caused by a fungal infection. In theses cases, your caregiver will prescribe antifungal medicine. For some cases of chronic sinusitis, surgery is needed. Generally, these are cases in which sinusitis recurs more than 3 times per year, despite other treatments. HOME CARE INSTRUCTIONS   Drink plenty of water. Water helps thin the mucus so your sinuses can drain more easily.  Use a humidifier.  Inhale steam 3 to 4 times a day (for example, sit in the bathroom with the shower running).  Apply a warm, moist washcloth to your face 3 to 4 times a day, or as directed by your caregiver.  Use saline nasal sprays to help moisten and clean your sinuses.  Take over-the-counter or prescription medicines for pain, discomfort, or fever only as directed by your caregiver. SEEK IMMEDIATE MEDICAL   CARE IF:  You have increasing pain or severe headaches.  You have nausea, vomiting, or drowsiness.  You have swelling around your face.  You have vision problems.  You have a stiff neck.  You have difficulty breathing. MAKE SURE YOU:   Understand these  instructions.  Will watch your condition.  Will get help right away if you are not doing well or get worse. Document Released: 02/25/2005 Document Revised: 05/20/2011 Document Reviewed: 03/12/2011 ExitCare Patient Information 2013 [employer]. Cough, Adult  A cough is a reflex that helps clear your throat and airways. It can help heal the body or may be a reaction to an irritated airway. A cough may only last 2 or 3 weeks (acute) or may last more than 8 weeks (chronic).  CAUSES Acute cough:  Viral or bacterial infections. Chronic cough:  Infections.  Allergies.  Asthma.  Post-nasal drip.  Smoking.  Heartburn or acid reflux.  Some medicines.  Chronic lung problems (COPD).  Cancer. SYMPTOMS   Cough.  Fever.  Chest pain.  Increased breathing rate.  High-pitched whistling sound when breathing (wheezing).  Colored mucus that you cough up (sputum). TREATMENT   A bacterial cough may be treated with antibiotic medicine.  A viral cough must run its course and will not respond to antibiotics.  Your caregiver may recommend other treatments if you have a chronic cough. HOME CARE INSTRUCTIONS   Only take over-the-counter or prescription medicines for pain, discomfort, or fever as directed by your caregiver. Use cough suppressants only as directed by your caregiver.  Use a cold steam vaporizer or humidifier in your bedroom or home to help loosen secretions.  Sleep in a semi-upright position if your cough is worse at night.  Rest as needed.  Stop smoking if you smoke. SEEK IMMEDIATE MEDICAL CARE IF:   You have pus in your sputum.  Your cough starts to worsen.  You cannot control your cough with suppressants and are losing sleep.  You begin coughing up blood.  You have difficulty breathing.  You develop pain which is getting worse or is uncontrolled with medicine.  You have a fever. MAKE SURE YOU:   Understand these instructions.  Will watch your  condition.  Will get help right away if you are not doing well or get worse. Document Released: 08/24/2010 Document Revised: 05/20/2011 Document Reviewed: 08/24/2010 ExitCare Patient Information 2013 [employer].  

## 2012-03-20 ENCOUNTER — Encounter (HOSPITAL_COMMUNITY)
Admission: RE | Admit: 2012-03-20 | Discharge: 2012-03-20 | Disposition: A | Discharge status: Home / Self Care | Payer: PRIVATE HEALTH INSURANCE | Source: Ambulatory Visit | Attending: Gynecology | Admitting: Gynecology

## 2012-03-20 ENCOUNTER — Encounter (HOSPITAL_COMMUNITY): Payer: Self-pay

## 2012-03-20 LAB — CBC
HCT: 35.1 % — ABNORMAL LOW (ref 36.0–46.0)
Hemoglobin: 10.7 g/dL — ABNORMAL LOW (ref 12.0–15.0)
MCH: 27.1 pg (ref 26.0–34.0)
MCHC: 30.5 g/dL (ref 30.0–36.0)
MCV: 88.9 fL (ref 78.0–100.0)
Platelets: 266 10*3/uL (ref 150–400)
RBC: 3.95 MIL/uL (ref 3.87–5.11)
RDW: 14.4 % (ref 11.5–15.5)
WBC: 3.1 10*3/uL — ABNORMAL LOW (ref 4.0–10.5)

## 2012-03-20 NOTE — Patient Instructions (Addendum)
20 MAKAYLI BRACKEN  03/20/2012   Your procedure is scheduled on:  03/23/12  Enter through the Main Entrance of [facility] at 6 AM.  Pick up the phone at the desk and dial [phone].   Call this number if you have problems the morning of surgery: [phone]   Remember:   Do not eat food:After Midnight.  Do not drink clear liquids: After Midnight.  Take these medicines the morning of surgery with A SIP OF WATER: NA   Do not wear jewelry, make-up or nail polish.  Do not wear lotions, powders, or perfumes. You may wear deodorant.  Do not shave 48 hours prior to surgery.  Do not bring valuables to the hospital.  Contacts, dentures or bridgework may not be worn into surgery.  Leave suitcase in the car. After surgery it may be brought to your room.  For patients admitted to the hospital, checkout time is 11:00 AM the day of discharge.   Patients discharged the day of surgery will not be allowed to drive home.  Name and phone number of your driver: NA  Special Instructions: Shower using CHG 2 nights before surgery and the night before surgery.  If you shower the day of surgery use CHG.  Use special wash - you have one bottle of CHG for all showers.  You should use approximately 1/3 of the bottle for each shower.   Please read over the following fact sheets that you were given: MRSA Information

## 2012-03-22 ENCOUNTER — Encounter (HOSPITAL_COMMUNITY): Payer: Self-pay | Admitting: Anesthesiology

## 2012-03-22 MED ORDER — DEXTROSE 5 % IV SOLN
2.0000 g | INTRAVENOUS | Status: AC
  Administered 2012-03-23: 2 g via INTRAVENOUS
  Filled 2012-03-22: qty 2

## 2012-03-22 NOTE — Anesthesia Preprocedure Evaluation (Addendum)
Anesthesia Evaluation  Patient identified by MRN, date of birth, ID band Patient awake    Reviewed: Allergy & Precautions, H&P , NPO status , Patient's Chart, lab work & pertinent test results  Airway Mallampati: II TM Distance: >3 FB Neck ROM: Full    Dental No notable dental hx. (+) Teeth Intact   Pulmonary Recent URI ,  Acute Maxillary Sinusitis last week- on Augmentin Cough breath sounds clear to auscultation  Pulmonary exam normal       Cardiovascular negative cardio ROS  Rhythm:Regular Rate:Normal     Neuro/Psych negative neurological ROS  negative psych ROS   GI/Hepatic negative GI ROS, Neg liver ROS, Cholelithiasis   Endo/Other  diabetes, Gestational  Renal/GU negative Renal ROS  negative genitourinary   Musculoskeletal negative musculoskeletal ROS (+)   Abdominal   Peds  Hematology negative hematology ROS (+)   Anesthesia Other Findings   Reproductive/Obstetrics Menorrhagia Leiomyoma                           Anesthesia Physical Anesthesia Plan  ASA: II  Anesthesia Plan: General   Post-op Pain Management:    Induction: Intravenous  Airway Management Planned: Oral ETT  Additional Equipment:   Intra-op Plan:   Post-operative Plan: Extubation in OR  Informed Consent: I have reviewed the patients History and Physical, chart, labs and discussed the procedure including the risks, benefits and alternatives for the proposed anesthesia with the patient or authorized representative who has indicated his/her understanding and acceptance.   Dental advisory given  Plan Discussed with: CRNA, Anesthesiologist and Surgeon  Anesthesia Plan Comments:         Anesthesia Quick Evaluation

## 2012-03-23 ENCOUNTER — Encounter (HOSPITAL_COMMUNITY): Payer: Self-pay | Admitting: Anesthesiology

## 2012-03-23 ENCOUNTER — Encounter (HOSPITAL_COMMUNITY): Payer: Self-pay | Admitting: *Deleted

## 2012-03-23 ENCOUNTER — Encounter (HOSPITAL_COMMUNITY)
Admission: RE | Disposition: A | Discharge status: Home / Self Care | Payer: Self-pay | Source: Ambulatory Visit | Attending: Gynecology

## 2012-03-23 ENCOUNTER — Inpatient Hospital Stay (HOSPITAL_COMMUNITY): Payer: PRIVATE HEALTH INSURANCE | Admitting: Anesthesiology

## 2012-03-23 ENCOUNTER — Observation Stay (HOSPITAL_COMMUNITY)
Admission: RE | Admit: 2012-03-23 | Discharge: 2012-03-24 | Disposition: A | Discharge status: Home / Self Care | Payer: PRIVATE HEALTH INSURANCE | Source: Ambulatory Visit | Attending: Gynecology | Admitting: Gynecology

## 2012-03-23 DIAGNOSIS — N920 Excessive and frequent menstruation with regular cycle: Principal | ICD-10-CM | POA: Insufficient documentation

## 2012-03-23 DIAGNOSIS — N802 Endometriosis of fallopian tube: Secondary | ICD-10-CM | POA: Insufficient documentation

## 2012-03-23 DIAGNOSIS — N8000 Endometriosis of the uterus, unspecified: Secondary | ICD-10-CM | POA: Insufficient documentation

## 2012-03-23 DIAGNOSIS — N80209 Endometriosis of unspecified fallopian tube, unspecified depth: Secondary | ICD-10-CM | POA: Insufficient documentation

## 2012-03-23 DIAGNOSIS — R05 Cough: Secondary | ICD-10-CM

## 2012-03-23 DIAGNOSIS — Z01818 Encounter for other preprocedural examination: Secondary | ICD-10-CM | POA: Insufficient documentation

## 2012-03-23 DIAGNOSIS — D252 Subserosal leiomyoma of uterus: Secondary | ICD-10-CM | POA: Insufficient documentation

## 2012-03-23 DIAGNOSIS — D250 Submucous leiomyoma of uterus: Secondary | ICD-10-CM | POA: Insufficient documentation

## 2012-03-23 DIAGNOSIS — D500 Iron deficiency anemia secondary to blood loss (chronic): Secondary | ICD-10-CM | POA: Insufficient documentation

## 2012-03-23 DIAGNOSIS — D649 Anemia, unspecified: Secondary | ICD-10-CM

## 2012-03-23 DIAGNOSIS — N926 Irregular menstruation, unspecified: Secondary | ICD-10-CM | POA: Insufficient documentation

## 2012-03-23 DIAGNOSIS — Z01812 Encounter for preprocedural laboratory examination: Secondary | ICD-10-CM | POA: Insufficient documentation

## 2012-03-23 DIAGNOSIS — D251 Intramural leiomyoma of uterus: Secondary | ICD-10-CM | POA: Insufficient documentation

## 2012-03-23 DIAGNOSIS — J0100 Acute maxillary sinusitis, unspecified: Secondary | ICD-10-CM

## 2012-03-23 DIAGNOSIS — D259 Leiomyoma of uterus, unspecified: Secondary | ICD-10-CM

## 2012-03-23 HISTORY — PX: ABDOMINAL HYSTERECTOMY: SHX81

## 2012-03-23 HISTORY — PX: BILATERAL SALPINGECTOMY: SHX5743

## 2012-03-23 SURGERY — HYSTERECTOMY, ABDOMINAL
Anesthesia: General | Site: Abdomen | Wound class: Clean Contaminated

## 2012-03-23 MED ORDER — FENTANYL CITRATE 0.05 MG/ML IJ SOLN
INTRAMUSCULAR | Status: AC
  Administered 2012-03-23: 25 ug via INTRAVENOUS
  Filled 2012-03-23: qty 2

## 2012-03-23 MED ORDER — HYDROMORPHONE HCL PF 1 MG/ML IJ SOLN
INTRAMUSCULAR | Status: DC | PRN
  Administered 2012-03-23: 1 mg via INTRAVENOUS

## 2012-03-23 MED ORDER — MORPHINE SULFATE (PF) 1 MG/ML IV SOLN
INTRAVENOUS | Status: DC
  Administered 2012-03-23: 10:00:00 via INTRAVENOUS
  Administered 2012-03-23: 15 mg via INTRAVENOUS
  Administered 2012-03-23: 16 mL via INTRAVENOUS
  Administered 2012-03-24: 1 mL via INTRAVENOUS
  Filled 2012-03-23: qty 25

## 2012-03-23 MED ORDER — KETOROLAC TROMETHAMINE 30 MG/ML IJ SOLN: 30.0000 mg | Freq: Four times a day (QID) | INTRAMUSCULAR | Status: DC

## 2012-03-23 MED ORDER — SODIUM CHLORIDE 0.9 % IJ SOLN
Freq: Once | INTRAMUSCULAR | Status: AC
  Administered 2012-03-23: 08:00:00
  Filled 2012-03-23: qty 20

## 2012-03-23 MED ORDER — KETOROLAC TROMETHAMINE 30 MG/ML IJ SOLN
INTRAMUSCULAR | Status: AC
  Filled 2012-03-23: qty 1

## 2012-03-23 MED ORDER — ONDANSETRON HCL 4 MG/2ML IJ SOLN: 4.0000 mg | Freq: Four times a day (QID) | INTRAMUSCULAR | Status: DC | PRN

## 2012-03-23 MED ORDER — DEXTROSE-NACL 5-0.9 % IV SOLN
INTRAVENOUS | Status: DC
  Administered 2012-03-23 – 2012-03-24 (×2): via INTRAVENOUS

## 2012-03-23 MED ORDER — SCOPOLAMINE 1 MG/3DAYS TD PT72
MEDICATED_PATCH | TRANSDERMAL | Status: AC
  Administered 2012-03-23: 1.5 mg via TRANSDERMAL
  Filled 2012-03-23: qty 1

## 2012-03-23 MED ORDER — FENTANYL CITRATE 0.05 MG/ML IJ SOLN
INTRAMUSCULAR | Status: AC
  Filled 2012-03-23: qty 5

## 2012-03-23 MED ORDER — NEOSTIGMINE METHYLSULFATE 1 MG/ML IJ SOLN
INTRAMUSCULAR | Status: AC
  Filled 2012-03-23: qty 1

## 2012-03-23 MED ORDER — LIDOCAINE HCL (CARDIAC) 20 MG/ML IV SOLN
INTRAVENOUS | Status: DC | PRN
  Administered 2012-03-23: 60 mg via INTRAVENOUS

## 2012-03-23 MED ORDER — 0.9 % SODIUM CHLORIDE (POUR BTL) OPTIME
TOPICAL | Status: DC | PRN
  Administered 2012-03-23 (×2): 1000 mL

## 2012-03-23 MED ORDER — MIDAZOLAM HCL 2 MG/2ML IJ SOLN
INTRAMUSCULAR | Status: AC
  Filled 2012-03-23: qty 2

## 2012-03-23 MED ORDER — GLYCOPYRROLATE 0.2 MG/ML IJ SOLN
INTRAMUSCULAR | Status: AC
  Filled 2012-03-23: qty 2

## 2012-03-23 MED ORDER — LACTATED RINGERS IV SOLN
INTRAVENOUS | Status: DC
  Administered 2012-03-23: 07:00:00 via INTRAVENOUS

## 2012-03-23 MED ORDER — DIPHENHYDRAMINE HCL 50 MG/ML IJ SOLN: 12.5000 mg | Freq: Four times a day (QID) | INTRAMUSCULAR | Status: DC | PRN

## 2012-03-23 MED ORDER — SCOPOLAMINE 1 MG/3DAYS TD PT72
1.0000 | MEDICATED_PATCH | TRANSDERMAL | Status: DC
  Administered 2012-03-23: 1 via TRANSDERMAL
  Administered 2012-03-23: 1.5 mg via TRANSDERMAL

## 2012-03-23 MED ORDER — HYDROMORPHONE HCL PF 1 MG/ML IJ SOLN
INTRAMUSCULAR | Status: AC
  Filled 2012-03-23: qty 1

## 2012-03-23 MED ORDER — MUPIROCIN 2 % EX OINT
TOPICAL_OINTMENT | CUTANEOUS | Status: AC
  Administered 2012-03-23: 1 via NASAL
  Filled 2012-03-23: qty 22

## 2012-03-23 MED ORDER — PROMETHAZINE HCL 25 MG/ML IJ SOLN: 6.2500 mg | INTRAMUSCULAR | Status: DC | PRN

## 2012-03-23 MED ORDER — PROPOFOL 10 MG/ML IV EMUL
INTRAVENOUS | Status: DC | PRN
  Administered 2012-03-23: 150 mg via INTRAVENOUS

## 2012-03-23 MED ORDER — KETOROLAC TROMETHAMINE 30 MG/ML IJ SOLN
30.0000 mg | Freq: Four times a day (QID) | INTRAMUSCULAR | Status: DC
  Administered 2012-03-23 – 2012-03-24 (×3): 30 mg via INTRAVENOUS
  Filled 2012-03-23 (×3): qty 1

## 2012-03-23 MED ORDER — GLYCOPYRROLATE 0.2 MG/ML IJ SOLN
INTRAMUSCULAR | Status: DC | PRN
  Administered 2012-03-23: 0.4 mg via INTRAVENOUS

## 2012-03-23 MED ORDER — SODIUM CHLORIDE 0.9 % IJ SOLN: 9.0000 mL | INTRAMUSCULAR | Status: DC | PRN

## 2012-03-23 MED ORDER — ROCURONIUM BROMIDE 50 MG/5ML IV SOLN
INTRAVENOUS | Status: AC
  Filled 2012-03-23: qty 1

## 2012-03-23 MED ORDER — FENTANYL CITRATE 0.05 MG/ML IJ SOLN
25.0000 ug | INTRAMUSCULAR | Status: DC | PRN
  Administered 2012-03-23: 25 ug via INTRAVENOUS

## 2012-03-23 MED ORDER — MEPERIDINE HCL 25 MG/ML IJ SOLN: 6.2500 mg | INTRAMUSCULAR | Status: DC | PRN

## 2012-03-23 MED ORDER — KETOROLAC TROMETHAMINE 30 MG/ML IJ SOLN
INTRAMUSCULAR | Status: DC | PRN
  Administered 2012-03-23: 30 mg via INTRAVENOUS

## 2012-03-23 MED ORDER — DEXTROSE-NACL 5-0.9 % IV SOLN: INTRAVENOUS | Status: DC

## 2012-03-23 MED ORDER — ROCURONIUM BROMIDE 100 MG/10ML IV SOLN
INTRAVENOUS | Status: DC | PRN
  Administered 2012-03-23: 30 mg via INTRAVENOUS

## 2012-03-23 MED ORDER — NALOXONE HCL 0.4 MG/ML IJ SOLN: 0.4000 mg | INTRAMUSCULAR | Status: DC | PRN

## 2012-03-23 MED ORDER — OXYCODONE-ACETAMINOPHEN 5-325 MG PO TABS
1.0000 | ORAL_TABLET | ORAL | Status: DC | PRN
  Administered 2012-03-24 (×2): 1 via ORAL
  Filled 2012-03-23: qty 2
  Filled 2012-03-23: qty 1

## 2012-03-23 MED ORDER — ONDANSETRON HCL 4 MG/2ML IJ SOLN
INTRAMUSCULAR | Status: AC
  Filled 2012-03-23: qty 2

## 2012-03-23 MED ORDER — PROPOFOL 10 MG/ML IV EMUL
INTRAVENOUS | Status: AC
  Filled 2012-03-23: qty 20

## 2012-03-23 MED ORDER — ONDANSETRON HCL 4 MG/2ML IJ SOLN
INTRAMUSCULAR | Status: DC | PRN
  Administered 2012-03-23 (×2): 2 mg via INTRAVENOUS

## 2012-03-23 MED ORDER — MIDAZOLAM HCL 2 MG/2ML IJ SOLN: 0.5000 mg | Freq: Once | INTRAMUSCULAR | Status: DC | PRN

## 2012-03-23 MED ORDER — FENTANYL CITRATE 0.05 MG/ML IJ SOLN
INTRAMUSCULAR | Status: DC | PRN
  Administered 2012-03-23: 100 ug via INTRAVENOUS
  Administered 2012-03-23: 50 ug via INTRAVENOUS
  Administered 2012-03-23: 100 ug via INTRAVENOUS

## 2012-03-23 MED ORDER — LIDOCAINE HCL (CARDIAC) 20 MG/ML IV SOLN
INTRAVENOUS | Status: AC
  Filled 2012-03-23: qty 5

## 2012-03-23 MED ORDER — MIDAZOLAM HCL 5 MG/5ML IJ SOLN
INTRAMUSCULAR | Status: DC | PRN
  Administered 2012-03-23: 2 mg via INTRAVENOUS

## 2012-03-23 MED ORDER — MUPIROCIN 2 % EX OINT
TOPICAL_OINTMENT | Freq: Two times a day (BID) | CUTANEOUS | Status: DC
  Administered 2012-03-23: 1 via NASAL

## 2012-03-23 MED ORDER — DEXAMETHASONE SODIUM PHOSPHATE 10 MG/ML IJ SOLN
INTRAMUSCULAR | Status: AC
  Filled 2012-03-23: qty 1

## 2012-03-23 MED ORDER — DEXAMETHASONE SODIUM PHOSPHATE 4 MG/ML IJ SOLN
INTRAMUSCULAR | Status: DC | PRN
  Administered 2012-03-23: 10 mg via INTRAVENOUS

## 2012-03-23 MED ORDER — AMOXICILLIN-POT CLAVULANATE 875-125 MG PO TABS
1.0000 | ORAL_TABLET | Freq: Two times a day (BID) | ORAL | Status: DC
  Administered 2012-03-23 – 2012-03-24 (×2): 1 via ORAL
  Filled 2012-03-23 (×5): qty 1

## 2012-03-23 MED ORDER — DIPHENHYDRAMINE HCL 12.5 MG/5ML PO ELIX: 12.5000 mg | ORAL_SOLUTION | Freq: Four times a day (QID) | ORAL | Status: DC | PRN

## 2012-03-23 MED ORDER — ONDANSETRON HCL 4 MG PO TABS: 4.0000 mg | ORAL_TABLET | Freq: Four times a day (QID) | ORAL | Status: DC | PRN

## 2012-03-23 MED ORDER — KETOROLAC TROMETHAMINE 30 MG/ML IJ SOLN: 15.0000 mg | Freq: Once | INTRAMUSCULAR | Status: DC | PRN

## 2012-03-23 MED ORDER — NEOSTIGMINE METHYLSULFATE 1 MG/ML IJ SOLN
INTRAMUSCULAR | Status: DC | PRN
  Administered 2012-03-23: 3 mg via INTRAVENOUS

## 2012-03-23 SURGICAL SUPPLY — 42 items
BARRIER ADHS 3X4 INTERCEED (GAUZE/BANDAGES/DRESSINGS) IMPLANT
BENZOIN TINCTURE PRP APPL 2/3 (GAUZE/BANDAGES/DRESSINGS) ×6 IMPLANT
CANISTER SUCTION 2500CC (MISCELLANEOUS) ×3 IMPLANT
CELLS DAT CNTRL 66122 CELL SVR (MISCELLANEOUS) IMPLANT
CLOTH BEACON ORANGE TIMEOUT ST (SAFETY) ×3 IMPLANT
CONT PATH 16OZ SNAP LID 3702 (MISCELLANEOUS) ×3 IMPLANT
CONT SPECI 4OZ STER CLIK (MISCELLANEOUS) ×3 IMPLANT
DECANTER SPIKE VIAL GLASS SM (MISCELLANEOUS) ×3 IMPLANT
DRESSING TELFA 8X3 (GAUZE/BANDAGES/DRESSINGS) IMPLANT
DRSG COVADERM 4X10 (GAUZE/BANDAGES/DRESSINGS) ×3 IMPLANT
GLOVE BIO SURGEON STRL SZ7.5 (GLOVE) ×6 IMPLANT
GOWN PREVENTION PLUS LG XLONG (DISPOSABLE) ×6 IMPLANT
GOWN STRL NON-REIN LRG LVL3 (GOWN DISPOSABLE) ×3 IMPLANT
NEEDLE HYPO 25X1 1.5 SAFETY (NEEDLE) ×3 IMPLANT
NS IRRIG 1000ML POUR BTL (IV SOLUTION) ×6 IMPLANT
PACK ABDOMINAL GYN (CUSTOM PROCEDURE TRAY) ×3 IMPLANT
PAD ABD 7.5X8 STRL (GAUZE/BANDAGES/DRESSINGS) ×3 IMPLANT
PAD OB MATERNITY 4.3X12.25 (PERSONAL CARE ITEMS) ×3 IMPLANT
PROTECTOR NERVE ULNAR (MISCELLANEOUS) ×3 IMPLANT
RETRACTOR WND ALEXIS 25 LRG (MISCELLANEOUS) IMPLANT
RTRCTR WOUND ALEXIS 18CM MED (MISCELLANEOUS)
RTRCTR WOUND ALEXIS 25CM LRG (MISCELLANEOUS)
SPONGE LAP 18X18 X RAY DECT (DISPOSABLE) ×6 IMPLANT
STAPLER VISISTAT 35W (STAPLE) ×3 IMPLANT
STRIP CLOSURE SKIN 1/2X4 (GAUZE/BANDAGES/DRESSINGS) ×6 IMPLANT
SUT CHROMIC 3 0 TIES (SUTURE) IMPLANT
SUT SILK 3 0 SH 30 (SUTURE) IMPLANT
SUT VIC AB 0 CT1 18XCR BRD8 (SUTURE) ×4 IMPLANT
SUT VIC AB 0 CT1 27 (SUTURE) ×3
SUT VIC AB 0 CT1 27XBRD ANBCTR (SUTURE) ×6 IMPLANT
SUT VIC AB 0 CT1 8-18 (SUTURE) ×2
SUT VIC AB 2-0 CT1 27 (SUTURE)
SUT VIC AB 2-0 CT1 TAPERPNT 27 (SUTURE) IMPLANT
SUT VIC AB 2-0 SH 27 (SUTURE)
SUT VIC AB 2-0 SH 27XBRD (SUTURE) IMPLANT
SUT VIC AB 4-0 KS 27 (SUTURE) ×3 IMPLANT
SUT VICRYL 0 TIES 12 18 (SUTURE) ×3 IMPLANT
SYR CONTROL 10ML LL (SYRINGE) ×3 IMPLANT
TAPE CLOTH SURG 4X10 WHT LF (GAUZE/BANDAGES/DRESSINGS) ×3 IMPLANT
TOWEL OR 17X24 6PK STRL BLUE (TOWEL DISPOSABLE) ×9 IMPLANT
TRAY FOLEY CATH 14FR (SET/KITS/TRAYS/PACK) ×3 IMPLANT
WATER STERILE IRR 1000ML POUR (IV SOLUTION) IMPLANT

## 2012-03-23 NOTE — Addendum Note (Signed)
Addendum  created 03/23/12 1735 by Shanon Payor, CRNA   Modules edited:Notes Section

## 2012-03-23 NOTE — Progress Notes (Signed)
Patient ID: Shannon Marsh, female   DOB: 1965/10/14, 47 y.o.   MRN: [id number] Awake alert with minimal pain Dressing dry Minimal abdomina pain  Reviewed surgery with patient and husband.  Routine postoperative care with tentative discharge tomorrow.

## 2012-03-23 NOTE — Transfer of Care (Signed)
Immediate Anesthesia Transfer of Care Note  Patient: Shannon Marsh  Procedure(s) Performed: Procedure(s) (LRB) with comments: HYSTERECTOMY ABDOMINAL (N/A) -    BILATERAL SALPINGECTOMY (Bilateral)  Patient Location: PACU  Anesthesia Type:General  Level of Consciousness: awake, alert  and oriented  Airway & Oxygen Therapy: Patient Spontanous Breathing and Patient connected to nasal cannula oxygen  Post-op Assessment: Report given to PACU RN, Post -op Vital signs reviewed and stable and Patient moving all extremities  Post vital signs: Reviewed  Complications: No apparent anesthesia complications

## 2012-03-23 NOTE — H&P (Signed)
  The patient was examined.  I reviewed the proposed surgery and consent form with the patient.  The dictated history and physical is current and accurate and all questions were answered. The patient is ready to proceed with surgery and has a realistic understanding and expectation for the outcome.   Dara Lords MD, 7:14 AM 03/23/2012

## 2012-03-23 NOTE — Op Note (Addendum)
Shannon Marsh 1965-05-06 [id number]   Post Operative Note   Date of surgery:  03/23/2012  Pre Op Dx:  Leiomyoma, menorrhagia, irregular menses, iron deficiency anemia secondary to chronic blood loss  Post Op Dx:  same  Procedure:  Total abdominal hysterectomy bilateral salpingectomies  Surgeon:  Dara Lords  Assistant:  Reynaldo Minium  Anesthesia:  General  EBL:  200 cc  Urine output:  100 cc intraoperative  Complications:  None  Specimen:  Uterus, clinical weight 614 g to pathology  Findings: EUA:  External BUS vagina normal. Cervix grossly normal. Uterus bulky 16 weeks size. Adnexa without gross masses.   Operative:  Large uterus with multiple myomas palpably intramural, subserosal and pedunculated. Right and left ovaries grossly normal free and mobile. Right and left fallopian tubes grossly normal free and mobile. No evidence of pelvic endometriosis or adhesions. Upper abdominal exam shows appendix to be grossly normal free and mobile. Liver palpably normal.  Procedure:  Patient was taken to the operating room, underwent general anesthesia, received abdominal/perineal/vaginal preparation with Betadine solution, EUA performed and a Foley catheter was placed in sterile technique. A timeout was performed by the surgical team. The patient was draped in the usual fashion and the abdomen was sharply entered through a Pfannenstiel incision achieving adequate hemostasis all levels. A Balfour and bladder blade were placed within the incision and the intestines were packed from the operative field. The uterus was elevated, the right round ligament identified and transected with electrocautery and the right uterine ovarian pedicle was doubly clamped cut and doubly ligated using 0 Vicryl suture. A similar procedure was carried out on the other side. The vesicouterine peritoneal fold was then sharply incised and the bladder flap was sharply and bluntly developed without difficulty.  The right uterine vessels were skeletonized clamped cut and doubly ligated using 0 Vicryl suture at the level of the lower uterine segment/cervical junction. A similar procedure was carried out on the other side. Right and left ureters were identified and found to be away from the operative field. The uterus was progressively freed from its attachments to clamping cutting and ligating of the parametrial/paracervical tissues using 0 Vicryl suture and ultimately the vagina was crossclamped and the specimen excised. Inspection showed complete removal of the cervix. Right and left vaginal angle sutures were placed using 0 Vicryl suture and tagged for future reference. A single interrupted figure-of-eight stitch was placed in the midline to close the vagina. The pelvis was copiously irrigated showing adequate hemostasis at this point the right fallopian tube was elevated crossclamped along the mesosalpinx excised and ligated using 0 Vicryl suture and the specimen sent to pathology.  A similar procedure was carried out on the other side. Attention was taken to avoid the infundibulopelvic pedicle to maintain good vascularization of the ovary. The all packing was removed, Balfour retractor and bladder blade removed and several cc of Exparel were placed in the cul-de-sac. The anterior fascia was closed using 0 Vicryl suture in a running stitch starting at the angle and meeting in the middle. The incision was irrigated showing adequate hemostasis and Exparel was then injected circumferentially into the sub-cutaneous tissues. The skin was reapproximated using 4-0 Vicryl in a running subcuticular stitch. Steri-Strips and benzoin were applied and a pressure dressing applied. The patient received intraoperative Toradol, was awakened without difficulty and taken to the recovery room in good condition having tolerated the procedure well with clear yellow urine in the Foley catheter.    Dara Lords MD,  9:19 AM  03/23/2012

## 2012-03-23 NOTE — Anesthesia Postprocedure Evaluation (Signed)
Anesthesia Post Note  Patient: Shannon Marsh  Procedure(s) Performed: Procedure(s) (LRB): HYSTERECTOMY ABDOMINAL (N/A) BILATERAL SALPINGECTOMY (Bilateral)  Anesthesia type: GA  Patient location: PACU  Post pain: Pain level controlled  Post assessment: Post-op Vital signs reviewed  Last Vitals:  Filed Vitals:   03/23/12 0849  BP: 138/80  Pulse: 68  Temp: 36.8 C  Resp: 16    Post vital signs: Reviewed  Level of consciousness: sedated  Complications: No apparent anesthesia complications

## 2012-03-23 NOTE — Anesthesia Postprocedure Evaluation (Signed)
  Anesthesia Post-op Note  Patient: Shannon Marsh  Procedure(s) Performed: Procedure(s) (LRB) with comments: HYSTERECTOMY ABDOMINAL (N/A) -    BILATERAL SALPINGECTOMY (Bilateral)  Patient Location: [facility]  Anesthesia Type:General  Level of Consciousness: awake, alert  and oriented  Airway and Oxygen Therapy: Patient Spontanous Breathing and Patient connected to nasal cannula oxygen  Post-op Pain: mild  Post-op Assessment: Post-op Vital signs reviewed and Patient's Cardiovascular Status Stable  Post-op Vital Signs: Reviewed and stable  Complications: No apparent anesthesia complications

## 2012-03-23 NOTE — Addendum Note (Signed)
Addendum  created 03/23/12 0923 by Eusebio Friendly., MD   Modules edited:Orders, PRL Based Order Sets

## 2012-03-23 NOTE — Anesthesia Procedure Notes (Signed)
Procedure Name: Intubation Date/Time: 03/23/2012 7:33 AM Performed by: Harriett Rush, Jathen Sudano A Pre-anesthesia Checklist: Patient identified, Patient being monitored, Emergency Drugs available, Timeout performed and Suction available Patient Re-evaluated:Patient Re-evaluated prior to inductionOxygen Delivery Method: Circle system utilized Preoxygenation: Pre-oxygenation with 100% oxygen Intubation Type: IV induction Ventilation: Mask ventilation without difficulty Laryngoscope Size: Miller and 2 Grade View: Grade I Tube type: Oral Tube size: 6.0 mm Number of attempts: 1 Placement Confirmation: ETT inserted through vocal cords under direct vision,  positive ETCO2,  CO2 detector and breath sounds checked- equal and bilateral Secured at: 20 cm Tube secured with: Tape Dental Injury: Teeth and Oropharynx as per pre-operative assessment

## 2012-03-24 ENCOUNTER — Encounter (HOSPITAL_COMMUNITY): Payer: Self-pay | Admitting: Gynecology

## 2012-03-24 LAB — CBC
MCHC: 30.8 g/dL (ref 30.0–36.0)
Platelets: 216 10*3/uL (ref 150–400)
RDW: 14 % (ref 11.5–15.5)
WBC: 5.3 10*3/uL (ref 4.0–10.5)

## 2012-03-24 MED ORDER — OXYCODONE-ACETAMINOPHEN 5-325 MG PO TABS: 1.0000 | ORAL_TABLET | ORAL | Status: DC | PRN

## 2012-03-24 NOTE — Progress Notes (Signed)
Pt discharged to home with husband.  Condition stable.  Pt to car via wheelchair with Rolan Bucco, NT.  No equipment for home ordered at discharge.

## 2012-03-24 NOTE — Progress Notes (Addendum)
Shannon Marsh 12/26/1965 [id number]   1 Day Post-Op Procedure(s) (LRB): HYSTERECTOMY ABDOMINAL (N/A) BILATERAL SALPINGECTOMY (Bilateral)  Subjective: Patient reports feels well, no acute distress, pain severity reported mild, yes taking PO, foley catheter out, no voiding, yes ambulating, no passing flatus  Objective: Afeb, VSS   EXAM General: awake, no distress Resp: rhonchi clear to auscultation bilaterally Cardio: regular rate and rhythm, S1, S2 normal, no murmur, click, rub or gallop GI: soft, non tender, bowel sounds active, incisions dry intact Lower Extremities: Without swelling or tenderness Vaginal Bleeding: Reported absent  Assessment: s/p Procedure(s): HYSTERECTOMY ABDOMINAL BILATERAL SALPINGECTOMY: progressing well, eating, drinking, foley out awaiting voiding.  Active bowel sounds with good pain control.  Post op Hb 9.1 Plan: Discharge home today per patient request after voiding and pain relief demonstrated with PO pain meds.  Precautions, instructions and follow up were discussed with the patient.  Prescriptions provided per AVS.  Patient to call the office to arrange a post-operative appointmant in 2 weeks.  Dara Lords, MD 03/24/2012 8:04 AM

## 2012-03-25 ENCOUNTER — Telehealth: Payer: Self-pay | Admitting: Gynecology

## 2012-03-25 NOTE — Discharge Summary (Signed)
Shannon Marsh [birth year] [id number]   Discharge Summary  Date of Admission:  03/23/2012  Date of Discharge:  03/24/2012  Discharge Diagnosis:  Menorrhagia, irregular menses, iron deficient anemia, leiomyoma, adenomyosis, endometriosis  Procedure:  Procedure(s): HYSTERECTOMY ABDOMINAL BILATERAL SALPINGECTOMY  Pathology:  Accession #: [id number] OF SURGICAL PATHOLOGY Uterus and bilateral fallopian tubes - LEIOMYOMATA. - ADENOMYOSIS. - ENDOMETRIUM: BENIGN PROLIFERATIVE ENDOMETRIUM, NO ATYPIA, HYPERPLASIA OR MALIGNANCY. - CERVIX: BENIGN SQUAMOUS MUCOSA AND ENDOCERVICAL MUCOSA, NO DYSPLASIA OR MALIGNANCY. - BILATERAL FALLOPIAN TUBES: BENIGN FALLOPIAN TUBAL TISSUE WITH ENDOMETRIOSIS AND PARATUBAL CYSTS, NO EVIDENCE OF ATYPIA OR MALIGNANCY.  Hospital Course:  Patient underwent an uncomplicated TAH, bilateral salpingectomy 03/23/2012. The patient's postoperative course was uncomplicated and she was discharged on postoperative day #1 ambulating well tolerating a regular diet voiding without difficulty and good pain relief with a postoperative hemoglobin of 9. The patient received instructions for postoperative care, call precautions, received prescriptions per AVS and will be seen in the office 2 weeks following discharge.      Dara Lords MD, 9:01 AM 03/25/2012

## 2012-03-25 NOTE — Telephone Encounter (Signed)
Called patient in follow up of her surgery. Doing well with good pain relief. No questions for me. Reviewed pathology with her that showed leiomyoma adenomyosis and endometriosis. Patient will make appointment to see me in 2 weeks for postoperative check and will call me sooner if she has any questions.

## 2012-03-31 ENCOUNTER — Telehealth: Payer: Self-pay | Admitting: *Deleted

## 2012-03-31 MED ORDER — HYDROCODONE-ACETAMINOPHEN 5-500 MG PO TABS: 1.0000 | ORAL_TABLET | Freq: Four times a day (QID) | ORAL | Status: DC | PRN

## 2012-03-31 NOTE — Telephone Encounter (Signed)
Okay to call in Vicodin 5.0 #20 one by mouth every 6 hours when necessary pain

## 2012-03-31 NOTE — Telephone Encounter (Signed)
Pt had hysterectomy 03/23/12 called requesting refill on pain medication, pt said pain is not bad, but needs something stronger than motrin. Please advise

## 2012-03-31 NOTE — Telephone Encounter (Signed)
rx called in, pt informed as well 

## 2012-04-08 ENCOUNTER — Ambulatory Visit (INDEPENDENT_AMBULATORY_CARE_PROVIDER_SITE_OTHER): Payer: PRIVATE HEALTH INSURANCE | Admitting: Gynecology

## 2012-04-08 ENCOUNTER — Encounter: Payer: Self-pay | Admitting: Gynecology

## 2012-04-08 DIAGNOSIS — Z09 Encounter for follow-up examination after completed treatment for conditions other than malignant neoplasm: Secondary | ICD-10-CM

## 2012-04-08 NOTE — Patient Instructions (Signed)
Follow-up in 2 weeks for your next postoperative visit. 

## 2012-04-08 NOTE — Progress Notes (Signed)
Patient presents for her first postoperative visit 2 weeks status post TAH, bilateral salpingectomy. She's done well without complaints. Reviewed pathology which showed leiomyoma, adenomyosis and endometriosis on the fallopian tubes with uterine weight over 600 g.  Exam was chem assistant Abdomen soft nontender without masses guarding rebound organomegaly. Tunnel incision healing nicely. Pelvic external BUS vagina normal with cuff intact. Bimanual without masses or tenderness.  Assessment and plan: Normal postoperative check. Doing well. We'll slowly resume activities with the exception of continued pelvic rest. Follow up 2 weeks for her next postoperative visit.

## 2012-04-22 ENCOUNTER — Ambulatory Visit (INDEPENDENT_AMBULATORY_CARE_PROVIDER_SITE_OTHER): Payer: PRIVATE HEALTH INSURANCE | Admitting: Gynecology

## 2012-04-22 ENCOUNTER — Telehealth: Payer: Self-pay

## 2012-04-22 ENCOUNTER — Encounter: Payer: Self-pay | Admitting: Gynecology

## 2012-04-22 DIAGNOSIS — Z9889 Other specified postprocedural states: Secondary | ICD-10-CM

## 2012-04-22 NOTE — Progress Notes (Signed)
One month postop visit status post TAH bilateral salpingectomy. Doing well without complaints.   Exam with Kim assistant Abdomen soft nontender without masses guarding rebound organomegaly. Incision healed nicely. Pelvic external BUS vagina normal with cuff healing nicely. Bimanual without masses or tenderness.  Assessment and plan: Normal postop checkup. We'll slowly resume normal activities. Continue pelvic rest for 2 more weeks. Follow up September 2014 for annual exam, sooner as needed.

## 2012-04-22 NOTE — Patient Instructions (Signed)
Slowly resume activities. Continue pelvic rest for 2 more weeks. Follow up September 2014 for your annual exam, sooner if any issues.

## 2012-04-22 NOTE — Telephone Encounter (Signed)
Half days with no restriction

## 2012-04-22 NOTE — Telephone Encounter (Signed)
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  She is planning on half days the week of 05/04/12 and then full time after that week.  That 2/24 week is 6 weeks post op.  My question is do I need to put any restrictions on her disability form or can she return half days without restrictions? Thanks

## 2012-04-24 NOTE — Telephone Encounter (Signed)
Form completed and mailed to patient per her request. Copy will be scanned into echart.

## 2012-04-25 ENCOUNTER — Other Ambulatory Visit: Payer: Self-pay

## 2012-11-03 ENCOUNTER — Ambulatory Visit: Payer: Self-pay | Admitting: Gynecology

## 2012-11-06 ENCOUNTER — Encounter: Payer: Self-pay | Admitting: Gynecology

## 2013-01-14 ENCOUNTER — Other Ambulatory Visit: Payer: Self-pay

## 2013-03-02 ENCOUNTER — Encounter: Payer: Self-pay | Admitting: Gynecology

## 2013-04-16 ENCOUNTER — Encounter: Payer: Self-pay | Admitting: Gynecology

## 2013-05-14 ENCOUNTER — Encounter: Payer: Self-pay | Admitting: Gynecology

## 2014-01-10 ENCOUNTER — Encounter: Payer: Self-pay | Admitting: Gynecology

## 2014-02-01 ENCOUNTER — Ambulatory Visit: Payer: Self-pay | Admitting: General Practice

## 2014-02-02 ENCOUNTER — Encounter: Payer: Self-pay | Admitting: *Deleted

## 2014-02-14 ENCOUNTER — Encounter: Payer: Self-pay | Admitting: General Surgery

## 2014-02-14 ENCOUNTER — Ambulatory Visit (INDEPENDENT_AMBULATORY_CARE_PROVIDER_SITE_OTHER): Payer: PRIVATE HEALTH INSURANCE | Admitting: General Surgery

## 2014-02-14 VITALS — BP 140/90 | HR 88 | Resp 16 | Ht 65.0 in | Wt 167.0 lb

## 2014-02-14 DIAGNOSIS — K8020 Calculus of gallbladder without cholecystitis without obstruction: Secondary | ICD-10-CM

## 2014-02-14 NOTE — Patient Instructions (Addendum)

## 2014-02-14 NOTE — Progress Notes (Signed)
Patient ID: Shannon Marsh, female   DOB: 1966-01-22, 48 y.o.   MRN: [id number]  Chief Complaint  Patient presents with  . Other    gallstones    HPI Shannon Marsh is a 48 y.o. female who presents for an evaluation of gallstones. She complains of right upper quadrant abdominal pain that started shortly before Thanksgiving. She states she has some pain previous to this but not to this extent. She also had an episode at Thanksgiving. She had diarrhea with this episode. The pain is describe as a sharp pain that comes and goes. She denies vomiting but has had a mild case of nausea when the pain was present. She is unable to relate this pain to anything specific. She had an abdominal ultrasound perform on 02/01/14. The pain radiates all over when the pain is present. Bowels are typically regular with occasional constipation that results into diarrhea.  The patient was last seen here in March 2011 when she had had episodic abdominal pain unrelated to diet or activity. She was identified with gallstones, but had profound anemia at that time secondary to menorrhagia. She is undergone a hysterectomy for fibroids in January 2014 with resolution of the menorrhagia.    HPI  Past Medical History  Diagnosis Date  . Gall stones   . Hx gestational diabetes   . Anemia   . Gestational diabetes     History only with last preg only - GDM    Past Surgical History  Procedure Laterality Date  . Bilateral salpingectomy  03/23/2012    Procedure: BILATERAL SALPINGECTOMY;  Surgeon: Anastasio Auerbach, MD;  Location: [facility];  Service: Gynecology;  Laterality: Bilateral;  . Abdominal hysterectomy  03/23/2012    Procedure: HYSTERECTOMY ABDOMINAL;  Surgeon: Anastasio Auerbach, MD;  Location: [facility];  Service: Gynecology;  Laterality: N/A;  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    Family History  Problem Relation Age of Onset  . Diabetes Mother   . Hypertension Mother   . Diabetes Father    . Heart disease Father   . Diabetes Brother   . Hypertension Brother   . Hypertension Brother   . Stroke Brother     mild  . Other Neg Hx     Social History History  Substance Use Topics  . Smoking status: Never Smoker   . Smokeless tobacco: Never Used  . Alcohol Use: 0.0 oz/week    0 Not specified per week     Comment: occassional    No Known Allergies  Current Outpatient Prescriptions  Medication Sig Dispense Refill  . aspirin 81 MG tablet Take 81 mg by mouth as needed.     Marland Kitchen ibuprofen (ADVIL,MOTRIN) 200 MG tablet Take 400-600 mg by mouth every 6 (six) hours as needed. For headache    . Melatonin 3 MG CAPS Take 1 capsule by mouth as needed.      No current facility-administered medications for this visit.    Review of Systems Review of Systems  Constitutional: Negative.   Respiratory: Negative.   Cardiovascular: Negative.   Gastrointestinal: Positive for abdominal pain.    Blood pressure 140/90, pulse 88, resp. rate 16, height 5\' 5"  (1.651 m), weight 167 lb (75.751 kg), last menstrual period 02/18/2012.  Physical Exam Physical Exam  Constitutional: She is oriented to person, place, and time. She appears well-developed and well-nourished.  Neck: Neck supple. No thyromegaly present.  Cardiovascular: Normal rate, regular rhythm and normal heart sounds.   No murmur heard. Pulmonary/Chest:  Effort normal and breath sounds normal.  Abdominal: Soft. Normal appearance and bowel sounds are normal. There is no hepatosplenomegaly. There is no tenderness. No hernia.  Lymphadenopathy:    She has no cervical adenopathy.  Neurological: She is alert and oriented to person, place, and time.  Skin: Skin is warm and dry.    Data Reviewed CT scan of the abdomen from 03/15/2009 was reviewed. No abnormality was noted. Ultrasound of the abdomen dated 05/10/2009 showed cholelithiasis without evidence of acute cholecystitis.  Recently completed ultrasound dated 02/01/2014 shows a  echogenic stone measuring 1 cm in diameter. No gallbladder wall thickening. Common bile duct 3 mm.  Assessment    Symptomatic cholelithiasis.       Plan     The risks and benefits of elective cholecystectomy of been reviewed. Bleeding and biliary tract injury were discussed. Plans for laparoscopic procedure with the possibility of an open procedure was reviewed.  Patient's surgery has been scheduled for 03-09-14 at [facility].    PCP/Ref MD:  Jayme Cloud 02/14/2014, 12:27 PM

## 2014-02-15 ENCOUNTER — Other Ambulatory Visit: Payer: Self-pay | Admitting: General Surgery

## 2014-02-15 DIAGNOSIS — K8020 Calculus of gallbladder without cholecystitis without obstruction: Secondary | ICD-10-CM

## 2014-03-09 ENCOUNTER — Ambulatory Visit: Payer: Self-pay | Admitting: General Surgery

## 2014-03-09 ENCOUNTER — Encounter: Payer: Self-pay | Admitting: General Surgery

## 2014-03-09 DIAGNOSIS — K8044 Calculus of bile duct with chronic cholecystitis without obstruction: Secondary | ICD-10-CM

## 2014-03-09 HISTORY — PX: CHOLECYSTECTOMY: SHX55

## 2014-03-10 ENCOUNTER — Encounter: Payer: Self-pay | Admitting: General Surgery

## 2014-03-12 ENCOUNTER — Emergency Department: Payer: Self-pay | Admitting: Emergency Medicine

## 2014-03-12 LAB — BASIC METABOLIC PANEL
ANION GAP: 7 (ref 7–16)
BUN: 9 mg/dL (ref 7–18)
Calcium, Total: 8.3 mg/dL — ABNORMAL LOW (ref 8.5–10.1)
Chloride: 106 mmol/L (ref 98–107)
Co2: 28 mmol/L (ref 21–32)
Creatinine: 0.6 mg/dL (ref 0.60–1.30)
EGFR (African American): >60
GLUCOSE: 98 mg/dL (ref 65–99)
Osmolality: 280 (ref 275–301)
Potassium: 3.6 mmol/L (ref 3.5–5.1)
Sodium: 141 mmol/L (ref 136–145)

## 2014-03-12 LAB — CBC WITH DIFFERENTIAL/PLATELET
BASOS ABS: 0.1 10*3/uL (ref 0.0–0.1)
Basophil %: 0.5 %
EOS ABS: 0.1 10*3/uL (ref 0.0–0.7)
Eosinophil %: 0.6 %
HCT: 42.2 % (ref 35.0–47.0)
HGB: 14.3 g/dL (ref 12.0–16.0)
Lymphocyte #: 1 10*3/uL (ref 1.0–3.6)
Lymphocyte %: 10 %
MCH: 30.8 pg (ref 26.0–34.0)
MCHC: 33.9 g/dL (ref 32.0–36.0)
MCV: 91 fL (ref 80–100)
MONO ABS: 0.5 x10 3/mm (ref 0.2–0.9)
Monocyte %: 5.6 %
NEUTROS PCT: 83.3 %
Neutrophil #: 8.1 10*3/uL — ABNORMAL HIGH (ref 1.4–6.5)
PLATELETS: 289 10*3/uL (ref 150–440)
RBC: 4.64 10*6/uL (ref 3.80–5.20)
RDW: 12.4 % (ref 11.5–14.5)
WBC: 9.7 10*3/uL (ref 3.6–11.0)

## 2014-03-14 ENCOUNTER — Encounter: Payer: Self-pay | Admitting: General Surgery

## 2014-03-22 ENCOUNTER — Encounter: Payer: Self-pay | Admitting: General Surgery

## 2014-03-22 ENCOUNTER — Ambulatory Visit (INDEPENDENT_AMBULATORY_CARE_PROVIDER_SITE_OTHER): Payer: Self-pay | Admitting: General Surgery

## 2014-03-22 VITALS — BP 126/88 | HR 84 | Resp 12 | Ht 65.0 in | Wt 166.0 lb

## 2014-03-22 DIAGNOSIS — K8020 Calculus of gallbladder without cholecystitis without obstruction: Secondary | ICD-10-CM

## 2014-03-22 NOTE — Progress Notes (Signed)
Patient ID: Shannon Marsh, female   DOB: [birth year], 49 y.o.   MRN: [id number]  Chief Complaint  Patient presents with  . Routine Post Op    gallbladder    HPI Shannon Marsh is a 49 y.o. female who presents for a post op cholecystectomy. The procedure was performed on 03/09/14. The patient is doing well. No new complaints at this time.   HPI  Past Medical History  Diagnosis Date  . Gall stones   . Hx gestational diabetes   . Anemia   . Gestational diabetes     History only with last preg only - GDM    Past Surgical History  Procedure Laterality Date  . Bilateral salpingectomy  03/23/2012    Procedure: BILATERAL SALPINGECTOMY;  Surgeon: Anastasio Auerbach, MD;  Location: [facility];  Service: Gynecology;  Laterality: Bilateral;  . Abdominal hysterectomy  03/23/2012    Procedure: HYSTERECTOMY ABDOMINAL;  Surgeon: Anastasio Auerbach, MD;  Location: [facility];  Service: Gynecology;  Laterality: N/A;  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  . Cholecystectomy  03/09/14    Family History  Problem Relation Age of Onset  . Diabetes Mother   . Hypertension Mother   . Diabetes Father   . Heart disease Father   . Diabetes Brother   . Hypertension Brother   . Hypertension Brother   . Stroke Brother     mild  . Other Neg Hx     Social History History  Substance Use Topics  . Smoking status: Never Smoker   . Smokeless tobacco: Never Used  . Alcohol Use: 0.0 oz/week    0 Not specified per week     Comment: occassional    No Known Allergies  Current Outpatient Prescriptions  Medication Sig Dispense Refill  . aspirin 81 MG tablet Take 81 mg by mouth as needed.     Marland Kitchen ibuprofen (ADVIL,MOTRIN) 200 MG tablet Take 400-600 mg by mouth every 6 (six) hours as needed. For headache    . Melatonin 3 MG CAPS Take 1 capsule by mouth as needed.      No current facility-administered medications for this visit.    Review of Systems Review of Systems  Constitutional:  Negative.   Respiratory: Negative.   Cardiovascular: Negative.     Blood pressure 126/88, pulse 84, resp. rate 12, height 5\' 5"  (1.651 m), weight 166 lb (75.297 kg), last menstrual period 02/18/2012.  Physical Exam Physical Exam  Constitutional: She is oriented to person, place, and time. She appears well-developed and well-nourished.  Cardiovascular: Normal rate, regular rhythm and normal heart sounds.   No murmur heard. Pulmonary/Chest: Effort normal and breath sounds normal.  Abdominal: Soft. Bowel sounds are normal.  Port sites healing well. Mild thickening at the epigastric site.  Neurological: She is alert and oriented to person, place, and time.  Skin: Skin is warm and dry.    Data Reviewed Pathology:Chronic cholecystitis and cholelithiasis.  Assessment    Doing well status post cholecystectomy.     Plan    Patient may use massage to the slightly thickened epigastric scar desired. She can increase her activity as tolerated. Follow-up here will be on an as-needed basis.      PCP:  Kandace Parkins 03/22/2014, 4:11 PM

## 2014-03-22 NOTE — Patient Instructions (Signed)
Patient to return as needed. 

## 2014-07-02 NOTE — Op Note (Signed)
PATIENT NAME:  Shannon Marsh, Shannon Marsh MR#:  [id number] DATE OF BIRTH:  [birth year]  DATE OF PROCEDURE:  03/09/2014  PREOPERATIVE DIAGNOSIS: Chronic cholecystitis and cholelithiasis.   POSTOPERATIVE DIAGNOSIS: Chronic cholecystitis and cholelithiasis.  OPERATIVE PROCEDURE: Laparoscopic cholecystectomy with intraoperative cholangiogram.   OPERATING SURGEON: Hervey Ard, MD   ANESTHESIA: General endotracheal under Mathai S. Marcello Moores, MD.  ESTIMATED BLOOD LOSS: Minimal.   CLINICAL NOTE: This 49 year old woman has developed symptomatic cholelithiasis and was admitted for elective cholecystectomy.   OPERATIVE NOTE: With the patient under adequate general endotracheal anesthesia, the abdomen was prepped with ChloraPrep and draped. In Trendelenburg position, a Veress needle was placed through a transumbilical incision. After assuring intra-abdominal location with the hanging drop test, the abdomen was insufflated with CO2 at 10 mmHg pressure. A 10 mm step port was expanded. There was no evidence of injury from initial port placement. The patient was placed in reverse Trendelenburg position and rolled to the left. An 11 mm XL port was placed in the epigastrium and two 5-mm step ports were placed in the right lateral abdominal wall. The gallbladder was placed on cephalad traction. The neck of the gallbladder was cleared and fluoroscopic cholangiograms completed using one-half strength Conray 60. This showed prompt filling of the biliary tree without evidence of retained stones. The cystic duct and cystic artery were doubly clipped and divided. The gallbladder was then removed from the liver bed making use of hook cautery dissection. A small hole in the gallbladder was controlled with multiple clip applications. The gallbladder was then delivered through the umbilical port site. This was delivered without incident. After re-establishing pneumoperitoneum, inspection from the epigastric site showed no evidence of  injury from initial port placement. The right upper quadrant was irrigated with lactated Ringer solution. Good hemostasis was noted. The abdomen was then desufflated. The fascia at the umbilicus as well as the epigastric port site was closed with interrupted 0 Vicryl figure-of-eight sutures. Skin incisions were closed with 4-0 Vicryl subcuticular sutures. Benzoin, Steri-Strips, Telfa and Tegaderm dressings were then applied.   The patient tolerated the procedure well and was taken to the recovery room in stable condition.    ____________________________ Robert Bellow, MD jwb:TT D: 03/09/2014 20:55:00 ET T: 03/09/2014 21:21:50 ET JOB#: [id number]  cc: Nelda Severe. Burt Ek, MD Unknown CC Robert Bellow, MD, <Dictator>  Almin Livingstone Amedeo Kinsman MD ELECTRONICALLY SIGNED 03/10/2014 8:48

## 2014-07-04 LAB — SURGICAL PATHOLOGY

## 2015-04-13 IMAGING — CR DG CHOLANGIOGRAM OPERATIVE
1 series · 10 of 10 positions shown · non-contrast
Comparison: Abdominal ultrasound - 02/01/2014

CLINICAL DATA: Laparoscopic cholecystectomy.

EXAM:
INTRAOPERATIVE CHOLANGIOGRAM
FLUOROSCOPY TIME:  22 seconds.

[Series 5: cont. · 10 of 31 frames shown]
[frame 1/31]
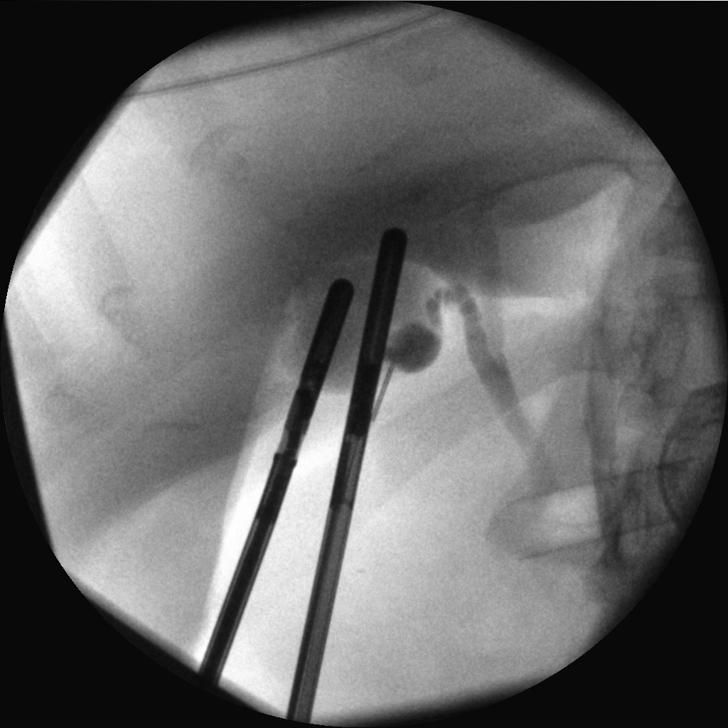
[frame 4/31]
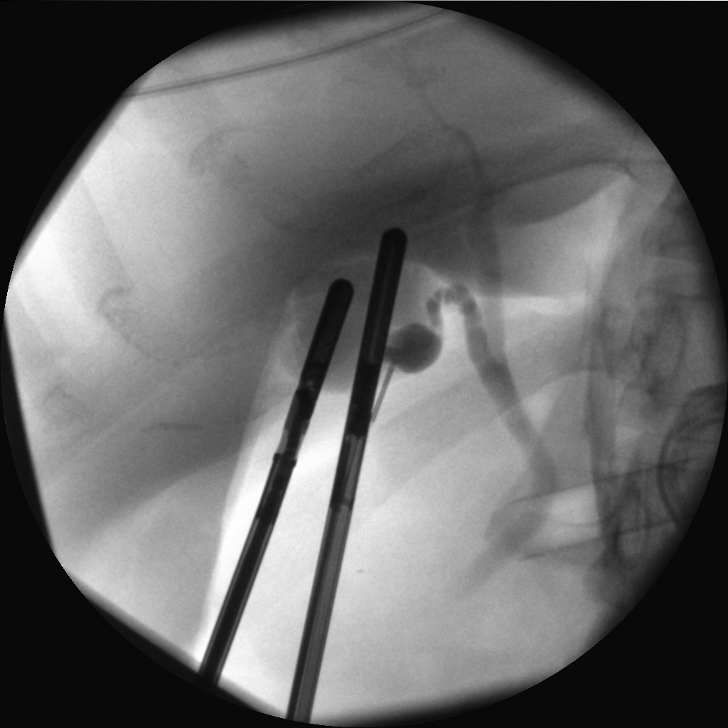
[frame 7/31]
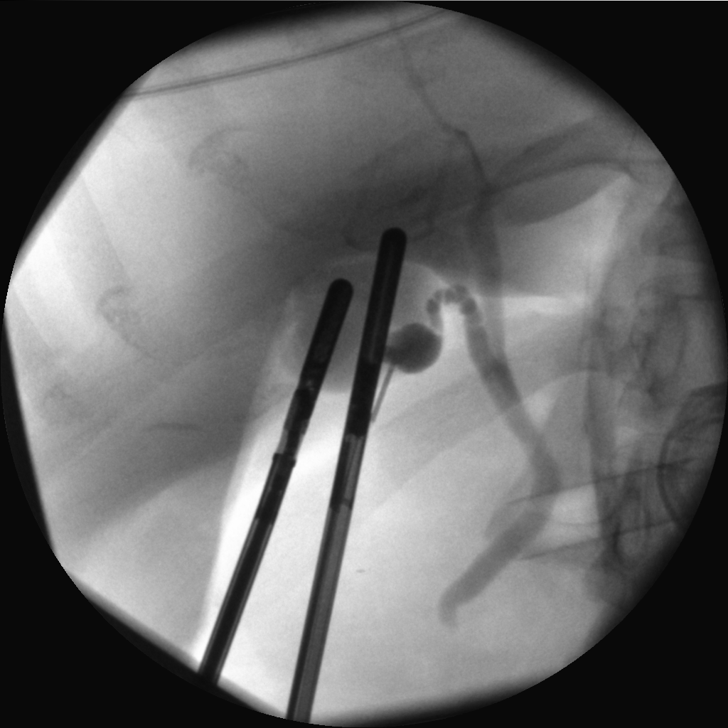
[frame 11/31]
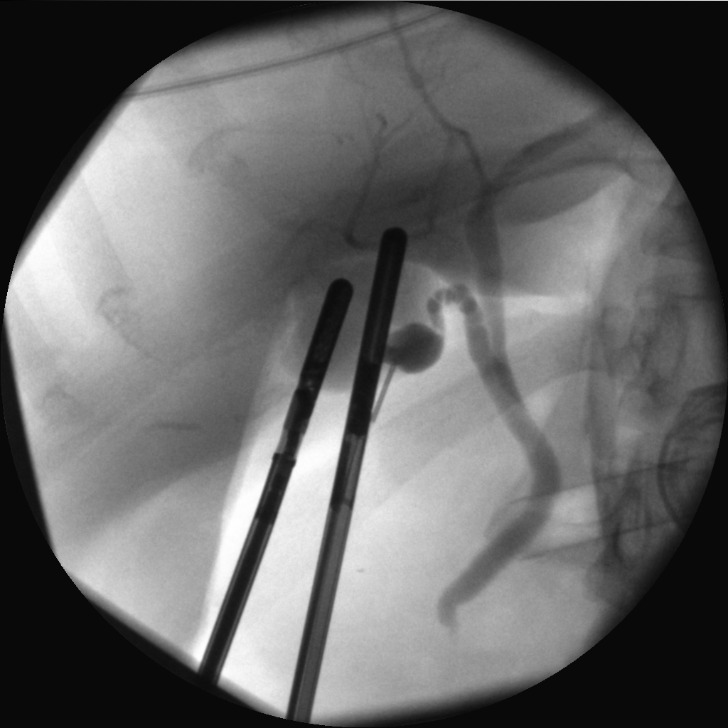
[frame 14/31]
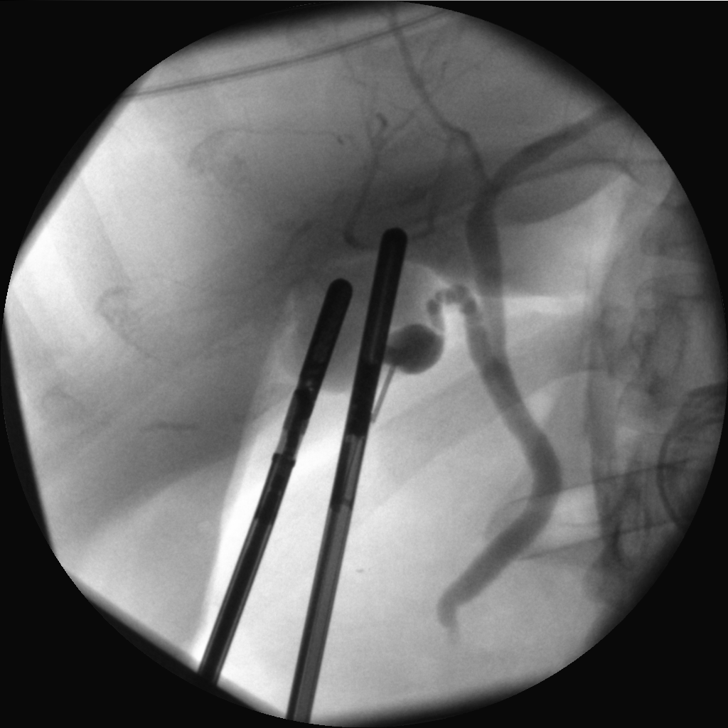
[frame 17/31]
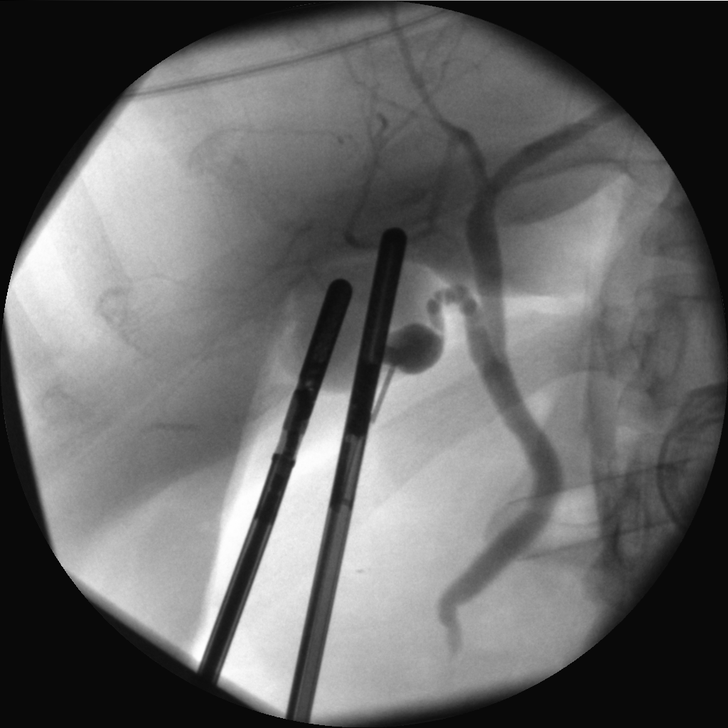
[frame 21/31]
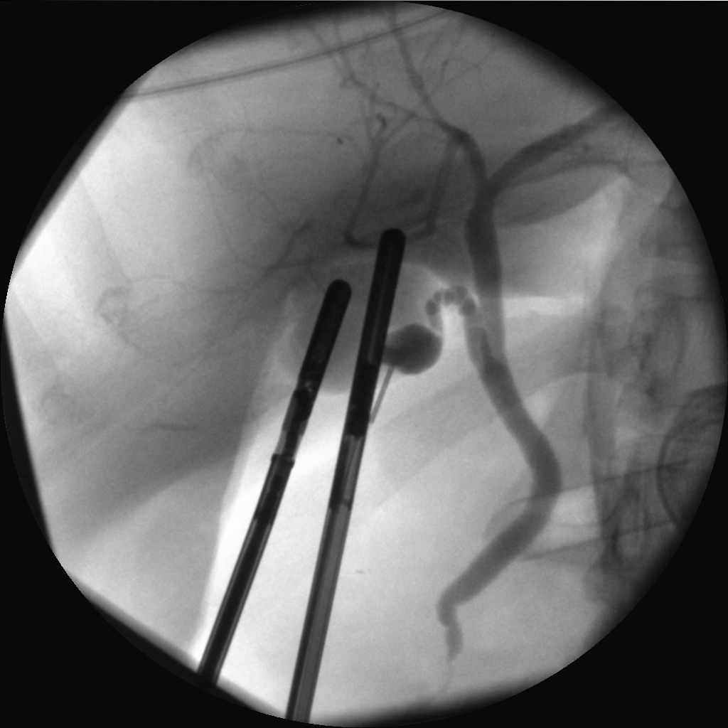
[frame 24/31]
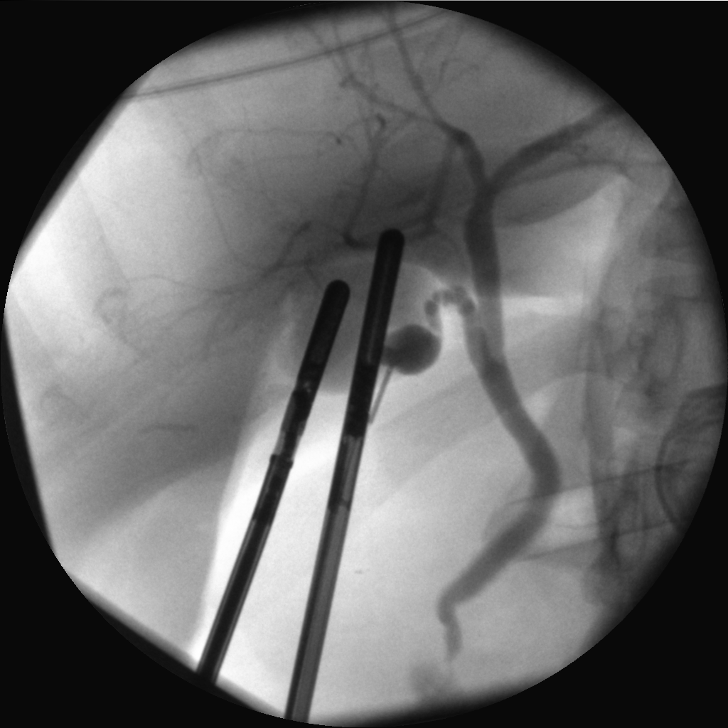
[frame 27/31]
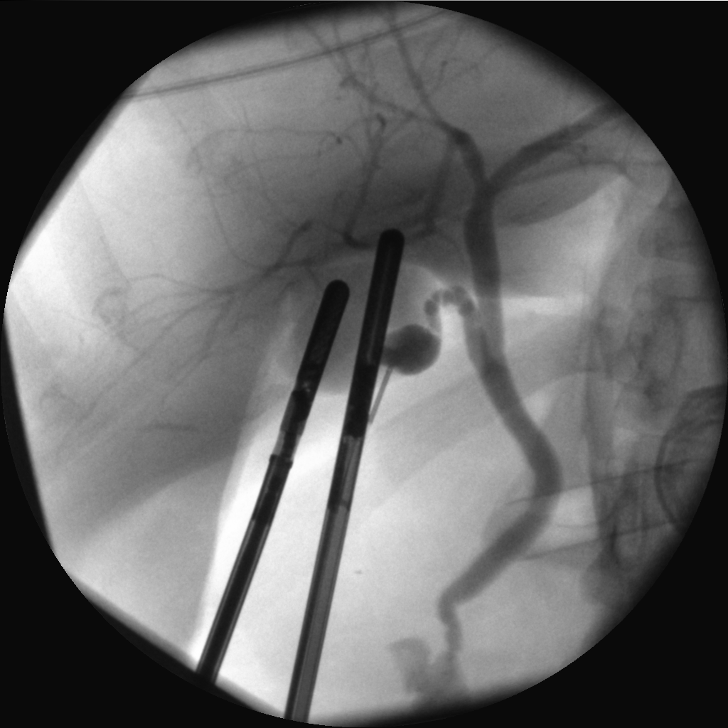
[frame 31/31]
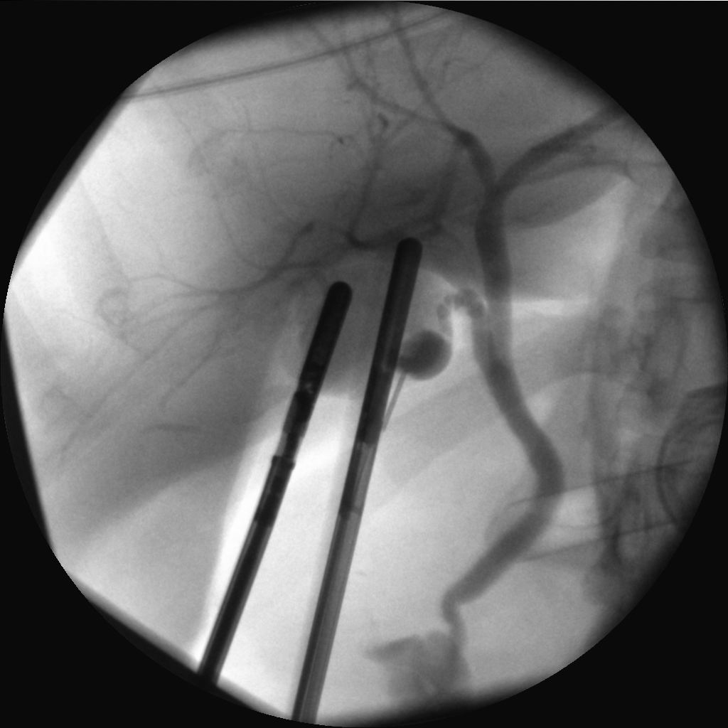

[10 of 10 positions shown; findings below may reference images not displayed]

FINDINGS: Intraoperative angiographic images of the right upper abdominal
quadrant during laparoscopic cholecystectomy are provided for
review.

Contrast injection demonstrates selective cannulation of the
gallbladder neck.

There is passage of contrast through the central aspect of the
cystic duct with filling of a non dilated common bile duct. There is
passage of contrast though the CBD and into the descending portion
of the duodenum.

There is minimal reflux of injected contrast into the common hepatic
duct and central aspect of the non dilated intrahepatic biliary
system.

There are no discrete filling defects within the opacified portions
of the biliary system to suggest the presence of
choledocholithiasis.
IMPRESSION: No evidence of choledocholithiasis.

## 2016-01-03 ENCOUNTER — Other Ambulatory Visit: Payer: Self-pay | Admitting: Gynecology

## 2016-01-04 ENCOUNTER — Other Ambulatory Visit: Payer: Self-pay | Admitting: Gynecology

## 2016-01-04 ENCOUNTER — Telehealth: Payer: Self-pay | Admitting: *Deleted

## 2016-01-04 DIAGNOSIS — Z1231 Encounter for screening mammogram for malignant neoplasm of breast: Secondary | ICD-10-CM

## 2016-01-04 NOTE — Telephone Encounter (Signed)
Pt has annual scheduled with you on 02/07/16 requesting order for mammogram screening faxed to [facility], last seen in 2014. Okay to have order faxed to breast center? Please advise

## 2016-01-04 NOTE — Telephone Encounter (Signed)
Order faxed to [phone] pt aware to call and schedule.

## 2016-01-04 NOTE — Telephone Encounter (Signed)
Okay 

## 2016-01-29 ENCOUNTER — Ambulatory Visit
Admission: RE | Admit: 2016-01-29 | Discharge: 2016-01-29 | Disposition: A | Discharge status: Home / Self Care | Payer: Managed Care, Other (non HMO) | Source: Ambulatory Visit | Attending: Gynecology | Admitting: Gynecology

## 2016-01-29 DIAGNOSIS — Z1231 Encounter for screening mammogram for malignant neoplasm of breast: Secondary | ICD-10-CM

## 2016-02-07 ENCOUNTER — Ambulatory Visit (INDEPENDENT_AMBULATORY_CARE_PROVIDER_SITE_OTHER): Payer: Managed Care, Other (non HMO) | Admitting: Gynecology

## 2016-02-07 ENCOUNTER — Encounter: Payer: Self-pay | Admitting: Gynecology

## 2016-02-07 VITALS — BP 124/80 | Ht 65.0 in | Wt 172.0 lb

## 2016-02-07 DIAGNOSIS — Z01419 Encounter for gynecological examination (general) (routine) without abnormal findings: Secondary | ICD-10-CM | POA: Diagnosis not present

## 2016-02-07 NOTE — Progress Notes (Signed)
    Shannon Marsh 03/30/1965 [id number]        50 y.o.  [id number]  for annual exam.  Has not been here for several years. Status post TAH bilateral salpingectomies for leiomyoma 2014. Doing well without complaints.  Past medical history,surgical history, problem list, medications, allergies, family history and social history were all reviewed and documented as reviewed in the EPIC chart.  ROS:  Performed with pertinent positives and negatives included in the history, assessment and plan.   Additional significant findings :  None   Exam: [name] assistant Vitals:   02/07/16 1545  BP: 124/80  Weight: 172 lb (78 kg)  Height: 5\' 5"  (1.651 m)   Body mass index is 28.62 kg/m.  General appearance:  Normal affect, orientation and appearance. Skin: Grossly normal HEENT: Without gross lesions.  No cervical or supraclavicular adenopathy. Thyroid normal.  Lungs:  Clear without wheezing, rales or rhonchi Cardiac: RR, without RMG Abdominal:  Soft, nontender, without masses, guarding, rebound, organomegaly or hernia Breasts:  Examined lying and sitting without masses, retractions, discharge or axillary adenopathy. Pelvic:  Ext, BUS, Vagina normal. Pap smear done  Adnexa without masses or tenderness    Anus and perineum normal   Rectovaginal normal sphincter tone without palpated masses or tenderness.    Assessment/Plan:  50 y.o. [id number] female for annual exam. Status post TAH bilateral salpingectomies for leiomyoma. Doing well without complaints. No menopausal symptoms.  1. Pap smear/HPV 11/2011. Pap smear done today. No history of significant abnormal Pap smears. Options to stop screening altogether based on hysterectomy history and current screening guidelines reviewed. Will readdress on annual basis. 2. Mammography 01/2016. Continue with annual mammography when due. SBE monthly reviewed. 3. Colonoscopy never. Reviewed current screening guidelines to initiate at age 14. Patient will  move to schedule sometime in the next year so. 4. Health maintenance. No routine lab work done as patient reports this done at her work. Follow up in one year, sooner as needed.   Anastasio Auerbach MD, 4:45 PM 02/07/2016

## 2016-02-07 NOTE — Patient Instructions (Signed)

## 2016-02-08 LAB — PAP IG W/ RFLX HPV ASCU

## 2016-06-28 ENCOUNTER — Ambulatory Visit: Payer: Self-pay | Admitting: Physician Assistant

## 2016-06-28 VITALS — HR 100

## 2016-06-28 DIAGNOSIS — R030 Elevated blood-pressure reading, without diagnosis of hypertension: Secondary | ICD-10-CM

## 2016-06-28 DIAGNOSIS — Z0000 Encounter for general adult medical examination without abnormal findings: Secondary | ICD-10-CM

## 2016-06-28 MED ORDER — LISINOPRIL 20 MG PO TABS: 20.0000 mg | ORAL_TABLET | Freq: Every day | ORAL | 3 refills | Status: DC

## 2016-06-28 NOTE — Progress Notes (Signed)
S: here for biometrics, states her bp has been running a little high lately, it was good at her physical in November, has several siblings, all have htn, some have dm, mother had htn, has really been worrying her that its elevated, had a brother to die at age 51 but he also had diabetes, denies cp/sob/dizziness, did have a some headache sx, nonsmoker, family hx as above  O: vitals with elevated bp lowest reading was 170/100, lungs c t a, cv rrr  A: elevated bp without hx of htn  P: ekg, labs, lisinopril 20mg  qd, recheck on Monday

## 2016-06-29 LAB — CMP12+LP+TP+TSH+6AC+CBC/D/PLT
ALK PHOS: 69 IU/L (ref 39–117)
ALT: 7 IU/L (ref 0–32)
AST: 13 IU/L (ref 0–40)
Albumin/Globulin Ratio: 2 (ref 1.2–2.2)
Albumin: 4.5 g/dL (ref 3.5–5.5)
BUN / CREAT RATIO: 13 (ref 9–23)
BUN: 9 mg/dL (ref 6–24)
Basophils Absolute: 0 10*3/uL (ref 0.0–0.2)
Basos: 0 %
Bilirubin Total: 0.6 mg/dL (ref 0.0–1.2)
CALCIUM: 9.3 mg/dL (ref 8.7–10.2)
CHLORIDE: 102 mmol/L (ref 96–106)
CHOL/HDL RATIO: 3.1 ratio (ref 0.0–4.4)
CREATININE: 0.72 mg/dL (ref 0.57–1.00)
Cholesterol, Total: 184 mg/dL (ref 100–199)
EOS (ABSOLUTE): 0.2 10*3/uL (ref 0.0–0.4)
Eos: 3 %
Estimated CHD Risk: <0.5 times avg. (ref 0.0–1.0)
Free Thyroxine Index: 2.3 (ref 1.2–4.9)
GFR calc Af Amer: 113 mL/min/{1.73_m2} (ref >59)
GFR, EST NON AFRICAN AMERICAN: 98 mL/min/{1.73_m2} (ref >59)
GGT: 15 IU/L (ref 0–60)
GLOBULIN, TOTAL: 2.3 g/dL (ref 1.5–4.5)
Glucose: 97 mg/dL (ref 65–99)
HDL: 60 mg/dL (ref >39)
Hematocrit: 44 % (ref 34.0–46.6)
Hemoglobin: 15.1 g/dL (ref 11.1–15.9)
Immature Grans (Abs): 0 10*3/uL (ref 0.0–0.1)
Immature Granulocytes: 0 %
Iron: 148 ug/dL (ref 27–159)
LDH: 143 IU/L (ref 119–226)
LDL CALC: 106 mg/dL — AB (ref 0–99)
LYMPHS ABS: 1.8 10*3/uL (ref 0.7–3.1)
Lymphs: 23 %
MCH: 30.6 pg (ref 26.6–33.0)
MCHC: 34.3 g/dL (ref 31.5–35.7)
MCV: 89 fL (ref 79–97)
Monocytes Absolute: 0.4 10*3/uL (ref 0.1–0.9)
Monocytes: 6 %
Neutrophils Absolute: 5.2 10*3/uL (ref 1.4–7.0)
Neutrophils: 68 %
PHOSPHORUS: 3.6 mg/dL (ref 2.5–4.5)
POTASSIUM: 4.5 mmol/L (ref 3.5–5.2)
Platelets: 355 10*3/uL (ref 150–379)
RBC: 4.93 x10E6/uL (ref 3.77–5.28)
RDW: 12.9 % (ref 12.3–15.4)
Sodium: 141 mmol/L (ref 134–144)
T3 UPTAKE RATIO: 27 % (ref 24–39)
T4 TOTAL: 8.7 ug/dL (ref 4.5–12.0)
TSH: 1.66 u[IU]/mL (ref 0.450–4.500)
Total Protein: 6.8 g/dL (ref 6.0–8.5)
Triglycerides: 89 mg/dL (ref 0–149)
Uric Acid: 5.6 mg/dL (ref 2.5–7.1)
VLDL Cholesterol Cal: 18 mg/dL (ref 5–40)
WBC: 7.6 10*3/uL (ref 3.4–10.8)

## 2016-06-29 LAB — VITAMIN D 25 HYDROXY (VIT D DEFICIENCY, FRACTURES): VIT D 25 HYDROXY: 12.5 ng/mL — AB (ref 30.0–100.0)

## 2016-07-01 ENCOUNTER — Ambulatory Visit: Payer: Self-pay | Admitting: Physician Assistant

## 2016-07-01 ENCOUNTER — Encounter: Payer: Self-pay | Admitting: Physician Assistant

## 2016-07-01 VITALS — BP 140/90

## 2016-07-01 DIAGNOSIS — I10 Essential (primary) hypertension: Secondary | ICD-10-CM

## 2016-07-01 MED ORDER — VITAMIN D (ERGOCALCIFEROL) 1.25 MG (50000 UNIT) PO CAPS: 50000.0000 [IU] | ORAL_CAPSULE | ORAL | 3 refills | Status: DC

## 2016-07-01 NOTE — Progress Notes (Signed)
Here for bp recheck, vit d was low on recent labs, sent rx for vit d to [facility]

## 2016-08-16 ENCOUNTER — Ambulatory Visit: Payer: Self-pay | Admitting: Physician Assistant

## 2016-08-16 ENCOUNTER — Encounter: Payer: Self-pay | Admitting: Physician Assistant

## 2016-08-16 VITALS — BP 130/80 | HR 76 | Temp 98.5°F

## 2016-08-16 DIAGNOSIS — J069 Acute upper respiratory infection, unspecified: Secondary | ICD-10-CM

## 2016-08-16 MED ORDER — PSEUDOEPH-BROMPHEN-DM 30-2-10 MG/5ML PO SYRP: 5.0000 mL | ORAL_SOLUTION | Freq: Four times a day (QID) | ORAL | 0 refills | Status: DC | PRN

## 2016-08-16 NOTE — Progress Notes (Signed)
   Subjective:URI    Patient ID: Shannon Marsh, female    DOB: 1965/03/26, 51 y.o.   MRN: [id number]  HPI Patient c/o cough secondary to sinus congestion and post nasal drainage. States cough worse when laying down to sleep. Denies fever/chill or N/V/D. No palliative measure for compliant.   Review of Systems    Hypertension Objective:   Physical Exam HEENT revealed edematous nasal turbinates with clear rhinorrhea. Post nasal drainage. Neck supple w/o adenoapthy. Lung CTA and Heart RRR.       Assessment & Plan:URI  Bromfed DM as directed.  Follow up with PCP if no improvement.

## 2016-08-19 ENCOUNTER — Ambulatory Visit: Payer: Self-pay | Admitting: Physician Assistant

## 2016-10-17 ENCOUNTER — Encounter: Payer: Self-pay | Admitting: Physician Assistant

## 2016-10-17 ENCOUNTER — Ambulatory Visit: Payer: Self-pay | Admitting: Physician Assistant

## 2016-10-17 VITALS — BP 140/89 | HR 88 | Temp 98.7°F

## 2016-10-17 DIAGNOSIS — B029 Zoster without complications: Secondary | ICD-10-CM

## 2016-10-17 MED ORDER — METHYLPREDNISOLONE 4 MG PO TBPK: ORAL_TABLET | ORAL | 0 refills | Status: DC

## 2016-10-17 MED ORDER — FAMCICLOVIR 500 MG PO TABS: 500.0000 mg | ORAL_TABLET | Freq: Three times a day (TID) | ORAL | 0 refills | Status: DC

## 2016-10-17 NOTE — Progress Notes (Signed)
S: pt c/o rash on back, started out as small little bumps, now its red and clustered, no fever, chills, no pain, no new products and doesn't remember an insect biting her  O: vitals wnl, skin with erythematous base and small bumps , no vesicles noted, no pustules or drainage  A: rash, shingles  P: famvir, medrol dose pack

## 2017-02-07 ENCOUNTER — Encounter: Payer: Self-pay | Admitting: Gynecology

## 2017-02-07 ENCOUNTER — Ambulatory Visit (INDEPENDENT_AMBULATORY_CARE_PROVIDER_SITE_OTHER): Payer: Managed Care, Other (non HMO) | Admitting: Gynecology

## 2017-02-07 VITALS — BP 124/80 | Ht 65.0 in | Wt 173.0 lb

## 2017-02-07 DIAGNOSIS — N898 Other specified noninflammatory disorders of vagina: Secondary | ICD-10-CM | POA: Diagnosis not present

## 2017-02-07 DIAGNOSIS — Z01411 Encounter for gynecological examination (general) (routine) with abnormal findings: Secondary | ICD-10-CM

## 2017-02-07 LAB — WET PREP FOR TRICH, YEAST, CLUE

## 2017-02-07 MED ORDER — FLUCONAZOLE 150 MG PO TABS: 150.0000 mg | ORAL_TABLET | Freq: Once | ORAL | 0 refills | Status: AC

## 2017-02-07 NOTE — Patient Instructions (Addendum)
Take the one Diflucan pill for the vaginal discharge.  Schedule your mammogram.  Schedule your colonoscopy with either:  [facility]   Address: [address], [place], [place] [address]  [phone]    or  [facility]  Address: [address], [place], [place] [address]  [phone]

## 2017-02-07 NOTE — Progress Notes (Signed)
    Shannon Marsh 09-03-1965 [id number]        51 y.o.  [id number] for annual gynecologic exam.  Patient also complaining of slight vaginal discharge.  Had treated herself twice this past year for presumed yeast infection where she was having discharge and itching.  She notes the itching and irritation has resolved but she has persisted in having a slight discharge.  No odor.  No urinary symptoms such as frequency dysuria urgency low back pain fever or chills.  Past medical history,surgical history, problem list, medications, allergies, family history and social history were all reviewed and documented as reviewed in the EPIC chart.  ROS:  Performed with pertinent positives and negatives included in the history, assessment and plan.   Additional significant findings : None   Exam: Shannon Marsh assistant Vitals:   02/07/17 1009  BP: 124/80  Weight: 173 lb (78.5 kg)  Height: 5\' 5"  (1.651 m)   Body mass index is 28.79 kg/m.  General appearance:  Normal affect, orientation and appearance. Skin: Grossly normal HEENT: Without gross lesions.  No cervical or supraclavicular adenopathy. Thyroid normal.  Lungs:  Clear without wheezing, rales or rhonchi Cardiac: RR, without RMG Abdominal:  Soft, nontender, without masses, guarding, rebound, organomegaly or hernia Breasts:  Examined lying and sitting without masses, retractions, discharge or axillary adenopathy. Pelvic:  Ext, BUS, Vagina: With slight white discharge  Adnexa: Without masses or tenderness    Anus and perineum: Normal   Rectovaginal: Normal sphincter tone without palpated masses or tenderness.    Assessment/Plan:  52 y.o. [id number] female for annual gynecologic exam status post TAH bilateral salpingectomies for leiomyoma..   1. Vaginal discharge.  Slight persistent discharge after treatment for yeast.  Other symptoms resolved.  Exam shows slight discharge.  Wet prep was negative.  Will cover for low-grade yeast with Diflucan  150 mg x1 dose.  Follow-up if symptoms persist, worsen or recur. 2. Pap smear 2017.  No Pap smear done today.  No history of significant abnormal Pap smears.  Options to stop screening per current screening guidelines based on hysterectomy history reviewed.  Will readdress on an annual basis. 3. Mammography 01/2016.  Patient plans to schedule.  Breast exam normal today. 4. Colonoscopy never.  Recommended patient schedule screening colonoscopy as she is over the age of 63.  Patient agrees to do so. 5. Health maintenance.  No routine lab work done as patient does this elsewhere.  Follow-up in 1 year, sooner as needed.  Additional time in excess of her routine gynecologic exam was spent in direct face to face counseling and coordination of care in regards to her vaginal discharge with treatment provided.    Anastasio Auerbach MD, 10:32 AM 02/07/2017

## 2017-02-07 NOTE — Addendum Note (Signed)
Addended by: Nelva Nay on: 02/07/2017 11:13 AM   Modules accepted: Orders

## 2017-03-20 ENCOUNTER — Ambulatory Visit: Payer: Self-pay | Admitting: Medical

## 2017-03-20 VITALS — BP 130/80 | HR 115 | Temp 98.1°F | Resp 16

## 2017-03-20 DIAGNOSIS — R059 Cough, unspecified: Secondary | ICD-10-CM

## 2017-03-20 DIAGNOSIS — J069 Acute upper respiratory infection, unspecified: Secondary | ICD-10-CM

## 2017-03-20 DIAGNOSIS — R05 Cough: Secondary | ICD-10-CM

## 2017-03-20 MED ORDER — AZITHROMYCIN 250 MG PO TABS: ORAL_TABLET | ORAL | 0 refills | Status: DC

## 2017-03-20 MED ORDER — ALBUTEROL SULFATE HFA 108 (90 BASE) MCG/ACT IN AERS: 2.0000 | INHALATION_SPRAY | Freq: Four times a day (QID) | RESPIRATORY_TRACT | 0 refills | Status: DC | PRN

## 2017-03-20 MED ORDER — BENZONATATE 100 MG PO CAPS: 100.0000 mg | ORAL_CAPSULE | Freq: Three times a day (TID) | ORAL | 0 refills | Status: DC

## 2017-03-20 NOTE — Patient Instructions (Signed)
Upper Respiratory Infection, Adult Most upper respiratory infections (URIs) are caused by a virus. A URI affects the nose, throat, and upper air passages. The most common type of URI is often called "the common cold." Follow these instructions at home:  Take medicines only as told by your doctor.  Gargle warm saltwater or take cough drops to comfort your throat as told by your doctor.  Use a warm mist humidifier or inhale steam from a shower to increase air moisture. This may make it easier to breathe.  Drink enough fluid to keep your pee (urine) clear or pale yellow.  Eat soups and other clear broths.  Have a healthy diet.  Rest as needed.  Go back to work when your fever is gone or your doctor says it is okay. ? You may need to stay home longer to avoid giving your URI to others. ? You can also wear a face mask and wash your hands often to prevent spread of the virus.  Use your inhaler more if you have asthma.  Do not use any tobacco products, including cigarettes, chewing tobacco, or electronic cigarettes. If you need help quitting, ask your doctor. Contact a doctor if:  You are getting worse, not better.  Your symptoms are not helped by medicine.  You have chills.  You are getting more short of breath.  You have brown or red mucus.  You have yellow or brown discharge from your nose.  You have pain in your face, especially when you bend forward.  You have a fever.  You have puffy (swollen) neck glands.  You have pain while swallowing.  You have white areas in the back of your throat. Get help right away if:  You have very bad or constant: ? Headache. ? Ear pain. ? Pain in your forehead, behind your eyes, and over your cheekbones (sinus pain). ? Chest pain.  You have long-lasting (chronic) lung disease and any of the following: ? Wheezing. ? Long-lasting cough. ? Coughing up blood. ? A change in your usual mucus.  You have a stiff neck.  You have  changes in your: ? Vision. ? Hearing. ? Thinking. ? Mood. This information is not intended to replace advice given to you by your health care provider. Make sure you discuss any questions you have with your health care provider. Document Released: 08/14/2007 Document Revised: 10/29/2015 Document Reviewed: 06/02/2013 Elsevier Interactive Patient Education  2018 Elsevier Inc. Cough, Adult A cough helps to clear your throat and lungs. A cough may last only 2-3 weeks (acute), or it may last longer than 8 weeks (chronic). Many different things can cause a cough. A cough may be a sign of an illness or another medical condition. Follow these instructions at home:  Pay attention to any changes in your cough.  Take medicines only as told by your doctor. ? If you were prescribed an antibiotic medicine, take it as told by your doctor. Do not stop taking it even if you start to feel better. ? Talk with your doctor before you try using a cough medicine.  Drink enough fluid to keep your pee (urine) clear or pale yellow.  If the air is dry, use a cold steam vaporizer or humidifier in your home.  Stay away from things that make you cough at work or at home.  If your cough is worse at night, try using extra pillows to raise your head up higher while you sleep.  Do not smoke, and try not   to be around smoke. If you need help quitting, ask your doctor.  Do not have caffeine.  Do not drink alcohol.  Rest as needed. Contact a doctor if:  You have new problems (symptoms).  You cough up yellow fluid (pus).  Your cough does not get better after 2-3 weeks, or your cough gets worse.  Medicine does not help your cough and you are not sleeping well.  You have pain that gets worse or pain that is not helped with medicine.  You have a fever.  You are losing weight and you do not know why.  You have night sweats. Get help right away if:  You cough up blood.  You have trouble breathing.  Your  heartbeat is very fast. This information is not intended to replace advice given to you by your health care provider. Make sure you discuss any questions you have with your health care provider. Document Released: 11/08/2010 Document Revised: 08/03/2015 Document Reviewed: 05/04/2014 Elsevier Interactive Patient Education  2018 Elsevier Inc.  

## 2017-03-20 NOTE — Progress Notes (Signed)
   Subjective:    Patient ID: Shannon Marsh, female    DOB: 04-12-1965, 52 y.o.   MRN: [id number]  HPI 52 yo female in non acute distress with symptoms starting on Friday, seemed to improve over the weekend and then Monday with scratchy sore throat which is now better. Tuesday with Fatigue / drainage and now with cough that is sometimes productive green.  Using OTC Nyquil and Dayquil.  And OTC cough medication this am (generic for Robitussin DM). Possible Fever Monday but none since.  HA on forehead.  Blood pressure 130/80, pulse (!) 115, temperature 98.1 F (36.7 C), temperature source Oral, resp. rate 16, last menstrual period 02/18/2012, SpO2 100 %.  Review of Systems  Constitutional: Negative for chills and fever.  HENT: Positive for congestion, postnasal drip, sinus pressure and sinus pain. Negative for ear pain, sore throat and voice change.   Eyes: Negative for discharge and itching.  Respiratory: Positive for cough. Negative for chest tightness and shortness of breath.   Cardiovascular: Negative for chest pain and leg swelling.  Neurological: Positive for headaches.       Objective:   Physical Exam  Constitutional: She is oriented to person, place, and time. She appears well-developed and well-nourished.  HENT:  Head: Normocephalic and atraumatic.  Right Ear: External ear normal.  Left Ear: External ear normal.  Eyes: Conjunctivae and EOM are normal. Pupils are equal, round, and reactive to light.  Neck: Normal range of motion. Neck supple.  Cardiovascular: Regular rhythm and normal heart sounds. Tachycardia present.  Pulmonary/Chest: Effort normal and breath sounds normal. No respiratory distress. She has no wheezes. She has no rales.  Lymphadenopathy:    She has no cervical adenopathy.  Neurological: She is alert and oriented to person, place, and time.  Skin: Skin is warm and dry.  Psychiatric: She has a normal mood and affect. Her behavior is normal. Judgment and  thought content normal.  Nursing note and vitals reviewed.   Cough in room. Cough on deep inspiration noted.     Assessment & Plan:  Upper Respiratory Infection/ Sinusitis/ Cough Meds ordered this encounter  Medications  . azithromycin (ZITHROMAX) 250 MG tablet    Sig: Take  2 tablets by mouth today then one tablet days 2-5 , take with food    Dispense:  6 tablet    Refill:  0  . benzonatate (TESSALON) 100 MG capsule    Sig: Take 1 capsule (100 mg total) by mouth 3 (three) times daily. As needed for cough    Dispense:  20 capsule    Refill:  0  . albuterol (PROVENTIL HFA;VENTOLIN HFA) 108 (90 Base) MCG/ACT inhaler    Sig: Inhale 2 puffs into the lungs every 6 (six) hours as needed for shortness of breath (cough , wheezing or sob).    Dispense:  1 Inhaler    Refill:  0   Reviewed with patient not to take decongestants due to hypertension history.  Return to clinic in 3-5 days if not improving. Patient verbalizes understanding and has no questions at discharge.

## 2017-06-19 ENCOUNTER — Telehealth: Payer: Self-pay

## 2017-06-19 ENCOUNTER — Other Ambulatory Visit: Payer: Self-pay | Admitting: Family Medicine

## 2017-06-19 DIAGNOSIS — I10 Essential (primary) hypertension: Secondary | ICD-10-CM

## 2017-06-19 MED ORDER — LISINOPRIL 20 MG PO TABS: 20.0000 mg | ORAL_TABLET | Freq: Every day | ORAL | 0 refills | Status: DC

## 2017-06-19 NOTE — Telephone Encounter (Signed)
I sent in one refill for her. Will you confirm her pharmacy? It defaulted to the [facility] in [place] but there were two options. I am happy to change this if needed. Please advise her that future refills need to come from a primary care provider.

## 2017-06-19 NOTE — Telephone Encounter (Signed)
Received fax for refill on Lisinopril 20mg   Dispensed: 06/28/16 #90 3 refills Last OV: 03/20/17 URI Last lab: 06/28/16

## 2017-06-19 NOTE — Telephone Encounter (Signed)
The fax request came directly from [facility]

## 2017-06-20 ENCOUNTER — Other Ambulatory Visit: Payer: Self-pay | Admitting: Family Medicine

## 2017-06-20 DIAGNOSIS — I10 Essential (primary) hypertension: Secondary | ICD-10-CM

## 2017-06-20 MED ORDER — LISINOPRIL 20 MG PO TABS: 20.0000 mg | ORAL_TABLET | Freq: Every day | ORAL | 0 refills | Status: DC

## 2017-06-20 NOTE — Telephone Encounter (Signed)
Ask patient to come in for a lab appointment to get this re-checked to see if she needs a refill. Inform her that she will need to follow up with PCP for future refills and monitoring.

## 2017-06-20 NOTE — Telephone Encounter (Signed)
Ok thank you for clarifying. I re-routed that to the correct pharmacy and cancelled the [facility] at [facility] by phone.

## 2017-06-20 NOTE — Telephone Encounter (Signed)
Pt had appt Monday for URI. Pt agreed to include Vit D lab and med refill with visit.

## 2017-06-20 NOTE — Telephone Encounter (Signed)
Received another fax from [facility] for a refill on her Vitamin D 50000units Caps.

## 2017-06-23 ENCOUNTER — Ambulatory Visit: Payer: Self-pay | Admitting: Family Medicine

## 2017-06-23 ENCOUNTER — Encounter: Payer: Self-pay | Admitting: Family Medicine

## 2017-06-23 VITALS — BP 136/87 | HR 88 | Temp 98.4°F | Resp 20

## 2017-06-23 DIAGNOSIS — E559 Vitamin D deficiency, unspecified: Secondary | ICD-10-CM

## 2017-06-23 DIAGNOSIS — R059 Cough, unspecified: Secondary | ICD-10-CM

## 2017-06-23 DIAGNOSIS — Z79899 Other long term (current) drug therapy: Secondary | ICD-10-CM

## 2017-06-23 DIAGNOSIS — R05 Cough: Secondary | ICD-10-CM

## 2017-06-23 MED ORDER — BENZONATATE 200 MG PO CAPS: 200.0000 mg | ORAL_CAPSULE | Freq: Every evening | ORAL | 0 refills | Status: DC | PRN

## 2017-06-23 NOTE — Progress Notes (Signed)
Subjective: Medication refills and cough     Shannon Marsh is a 52 y.o. female who presents for evaluation for a refill of her lisinopril and vitamin D and for symptoms of a URI.  Patient reports a mild nonproductive cough, mild nasal drainage, and sore throat for 6 days.  Patient reports the cough has been keeping her up at night until yesterday when it improved and she was finally able to sleep.  Patient has been taking Delsym OTC for this.  Patient has used nasal saline spray once or twice but not consistently.  Patient reports overall her symptoms are mild and improving gradually.  Reports her husband had similar symptoms, which is improving.  Denies fever or chills. Denies rash, nausea, vomiting, diarrhea, shortness of breath, wheezing, chest or back pain, ear pain, difficulty swallowing confusion, purulent nasal discharge, facial pressure, headache, body aches, fatigue, severe symptoms, or initial improvement and then worsening of symptoms. History of smoking, asthma, COPD: Negative History of recurrent sinus and/or lung infections: Negative.  Patient reports a history of vitamin D deficiency and that she has been taking vitamin D supplements for the last year.  Patient reports she was on it prior to that but there was a period of time where she was off of that between, unknown duration.  Denies any concerns or symptoms related to this but is requesting a refill.  Patient called last week requesting a refill of her lisinopril.  Lisinopril refill sent to her pharmacy last week so that she would not run out over the weekend and ordered blood work today for long-term monitoring of this.  Patient denies any issues/adverse/side effects related to this and reports her blood pressures are usually in the 120s over 70s.  Patient reports she monitors this frequently.  Denies low blood pressures.  Medical history: Hypertension and vitamin D deficiency.  Review of Systems Pertinent items noted in HPI  and remainder of comprehensive ROS otherwise negative.     Objective:   Physical Exam General: Awake, alert, and oriented. No acute distress. Well developed, hydrated and nourished. Appears stated age. Nontoxic appearance.  HEENT:  PND noted.  Mild erythema to posterior oropharynx.  No edema or exudates of pharynx or tonsils. No erythema or bulging of TM.  Mild erythema/edema to nasal mucosa. Sinuses nontender. Supple neck without adenopathy. Cardiac: Heart rate and rhythm are normal. No murmurs, gallops, or rubs are auscultated. S1 and S2 are heard and are of normal intensity.  Respiratory: No signs of respiratory distress. Lungs clear. No tachypnea. Able to speak in full sentences without dyspnea. Nonlabored respirations.  Skin: Skin is warm, dry and intact. Appropriate color for ethnicity. No cyanosis noted.    Assessment:    viral upper respiratory illness   Hypertension History of vitamin D deficiency  Plan:    Discussed diagnosis and treatment of URI. Discussed the importance of avoiding unnecessary antibiotic therapy. Suggested symptomatic OTC remedies. Nasal saline spray for congestion.   Tessalon Perles ordered as needed at night to help her sleep.  Patient has taken this in the past and tolerated this medication well. Provided patient with resources and encouraged her to establish care with PCP today, who will be responsible for monitoring and providing future refills for her medications. Discussed red flag symptoms and circumstances with which to seek medical care.  Labs ordered to monitor long-term use of medications and vitamin D level ordered.   New Prescriptions   BENZONATATE (TESSALON) 200 MG CAPSULE    Take  1 capsule (200 mg total) by mouth at bedtime as needed for cough.

## 2017-06-24 LAB — VITAMIN D 25 HYDROXY (VIT D DEFICIENCY, FRACTURES): Vit D, 25-Hydroxy: 52.1 ng/mL (ref 30.0–100.0)

## 2017-06-24 LAB — CBC WITH DIFFERENTIAL/PLATELET
BASOS ABS: 0 10*3/uL (ref 0.0–0.2)
Basos: 0 %
EOS (ABSOLUTE): 0.3 10*3/uL (ref 0.0–0.4)
Eos: 5 %
HEMOGLOBIN: 14.8 g/dL (ref 11.1–15.9)
Hematocrit: 44 % (ref 34.0–46.6)
IMMATURE GRANS (ABS): 0 10*3/uL (ref 0.0–0.1)
Immature Granulocytes: 0 %
LYMPHS ABS: 1.5 10*3/uL (ref 0.7–3.1)
LYMPHS: 30 %
MCH: 31 pg (ref 26.6–33.0)
MCHC: 33.6 g/dL (ref 31.5–35.7)
MCV: 92 fL (ref 79–97)
MONOCYTES: 5 %
Monocytes Absolute: 0.3 10*3/uL (ref 0.1–0.9)
NEUTROS PCT: 60 %
Neutrophils Absolute: 3 10*3/uL (ref 1.4–7.0)
Platelets: 299 10*3/uL (ref 150–379)
RBC: 4.77 x10E6/uL (ref 3.77–5.28)
RDW: 13 % (ref 12.3–15.4)
WBC: 5 10*3/uL (ref 3.4–10.8)

## 2017-06-24 LAB — COMPREHENSIVE METABOLIC PANEL
ALBUMIN: 4.5 g/dL (ref 3.5–5.5)
ALK PHOS: 64 IU/L (ref 39–117)
ALT: 11 IU/L (ref 0–32)
AST: 12 IU/L (ref 0–40)
Albumin/Globulin Ratio: 1.9 (ref 1.2–2.2)
BUN / CREAT RATIO: 15 (ref 9–23)
BUN: 11 mg/dL (ref 6–24)
Bilirubin Total: 0.3 mg/dL (ref 0.0–1.2)
CO2: 23 mmol/L (ref 20–29)
CREATININE: 0.71 mg/dL (ref 0.57–1.00)
Calcium: 9.6 mg/dL (ref 8.7–10.2)
Chloride: 105 mmol/L (ref 96–106)
GFR calc Af Amer: 114 mL/min/{1.73_m2} (ref >59)
GFR calc non Af Amer: 99 mL/min/{1.73_m2} (ref >59)
GLUCOSE: 109 mg/dL — AB (ref 65–99)
Globulin, Total: 2.4 g/dL (ref 1.5–4.5)
Potassium: 4.6 mmol/L (ref 3.5–5.2)
Sodium: 145 mmol/L — ABNORMAL HIGH (ref 134–144)
Total Protein: 6.9 g/dL (ref 6.0–8.5)

## 2017-06-24 NOTE — Progress Notes (Signed)
Ms. Carelli, I wanted to let you know that your lab results are back.  Everything is normal and your vitamin D level is 52.1.  Normal values are between 30 and 100.  This means that you will not need a refill for your vitamin D supplement at this time.  Your results show that your blood glucose is elevated at 109 but this is normal since this was a nonfasting value for you.

## 2017-07-23 ENCOUNTER — Ambulatory Visit: Payer: Managed Care, Other (non HMO) | Admitting: Family Medicine

## 2017-07-24 ENCOUNTER — Ambulatory Visit: Payer: Managed Care, Other (non HMO) | Admitting: Unknown Physician Specialty

## 2017-07-24 ENCOUNTER — Encounter: Payer: Self-pay | Admitting: Unknown Physician Specialty

## 2017-07-24 DIAGNOSIS — I10 Essential (primary) hypertension: Secondary | ICD-10-CM | POA: Diagnosis not present

## 2017-07-24 MED ORDER — LISINOPRIL 20 MG PO TABS: 20.0000 mg | ORAL_TABLET | Freq: Every day | ORAL | 1 refills | Status: DC

## 2017-07-24 NOTE — Progress Notes (Signed)
BP 130/80   Pulse 89   Temp 98.1 F (36.7 C) (Oral)   Ht 5' 4.7" (1.643 m)   Wt 178 lb 11.2 oz (81.1 kg)   LMP 02/18/2012   SpO2 99%   BMI 30.01 kg/m    Subjective:    Patient ID: Shannon Marsh, female    DOB: [birth year], 52 y.o.   MRN: [id number]  HPI: Shannon Marsh is a 52 y.o. female  Chief Complaint  Patient presents with  . Establish Care  . Hypertension   Pt is a new patient.  Her CC today is that her BP is high.  [facility] [place] [facility] and they don't want to manage any chronic conditions.  She is taking Lisinopril 20 mg and took last one today.  Pt states she has had high BP for about a years.  When she was first diagnosed she made a bunch of diet changes but not as good lately.  She avoids sodium.    Reviewed chart and labs look good.  BS 109 but pt not fasting that day.    Hypertension Using medications without difficulty Average home BPs Last night BP was 124/87.  Sometimes as high as 132/92.     No problems or lightheadedness No chest pain with exertion or shortness of breath No Edema   Social History   Socioeconomic History  . Marital status: Married    Spouse name: Not on file  . Number of children: Not on file  . Years of education: Not on file  . Highest education level: Not on file  Occupational History  . Not on file  Social Needs  . Financial resource strain: Not on file  . Food insecurity:    Worry: Not on file    Inability: Not on file  . Transportation needs:    Medical: Not on file    Non-medical: Not on file  Tobacco Use  . Smoking status: Never Smoker  . Smokeless tobacco: Never Used  Substance and Sexual Activity  . Alcohol use: Yes    Alcohol/week: 1.8 oz    Types: 3 Standard drinks or equivalent per week  . Drug use: No  . Sexual activity: Yes    Partners: Male    Birth control/protection: Surgical    Comment: 1st intercourse 52 yo-Fewer than 5 partners  Lifestyle  . Physical activity:    Days  per week: Not on file    Minutes per session: Not on file  . Stress: Not on file  Relationships  . Social connections:    Talks on phone: Not on file    Gets together: Not on file    Attends religious service: Not on file    Active member of club or organization: Not on file    Attends meetings of clubs or organizations: Not on file    Relationship status: Not on file  . Intimate partner violence:    Fear of current or ex partner: Not on file    Emotionally abused: Not on file    Physically abused: Not on file    Forced sexual activity: Not on file  Other Topics Concern  . Not on file  Social History Narrative  . Not on file   Family History  Problem Relation Age of Onset  . Diabetes Mother   . Hypertension Mother   . Dementia Mother   . Diabetes Father   . Heart disease Father   . Hypertension  Brother   . Hypertension Brother   . Hypertension Brother   . Stroke Brother        mild  . Hypertension Sister   . Bullous pemphigoid Sister   . Cancer Maternal Grandfather        lung  . Diabetes Brother   . Other Neg Hx   . Breast cancer Neg Hx    Past Medical History:  Diagnosis Date  . Anemia   . Gall stones   . Hx gestational diabetes   . Hypertension    Past Surgical History:  Procedure Laterality Date  . 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 BILATERAL SALPINGECTOMY  03/23/2012   Procedure: BILATERAL SALPINGECTOMY;  Surgeon: Anastasio Auerbach, MD;  Location: [facility];  Service: Gynecology;  Laterality: Bilateral;  . BREAST CYST EXCISION Left    neg  . CHOLECYSTECTOMY  03/09/14     Relevant past medical, surgical, family and social history reviewed and updated as indicated. Interim medical history since our last visit reviewed. Allergies and medications reviewed and  updated.  Review of Systems  Per HPI unless specifically indicated above     Objective:    BP 130/80   Pulse 89   Temp 98.1 F (36.7 C) (Oral)   Ht 5' 4.7" (1.643 m)   Wt 178 lb 11.2 oz (81.1 kg)   LMP 02/18/2012   SpO2 99%   BMI 30.01 kg/m   Wt Readings from Last 3 Encounters:  07/24/17 178 lb 11.2 oz (81.1 kg)  02/07/17 173 lb (78.5 kg)  02/07/16 172 lb (78 kg)    Physical Exam  Constitutional: She is oriented to person, place, and time. She appears well-developed and well-nourished. No distress.  HENT:  Head: Normocephalic and atraumatic.  Eyes: Conjunctivae and lids are normal. Right eye exhibits no discharge. Left eye exhibits no discharge. No scleral icterus.  Cardiovascular: Normal rate.  Pulmonary/Chest: Effort normal.  Abdominal: Normal appearance. There is no splenomegaly or hepatomegaly.  Musculoskeletal: Normal range of motion.  Neurological: She is alert and oriented to person, place, and time.  Skin: Skin is intact. No rash noted. No pallor.  Psychiatric: She has a normal mood and affect. Her behavior is normal. Judgment and thought content normal.    Results for orders placed or performed in visit on 06/23/17  Vitamin D (25 hydroxy)  Result Value Ref Range   Vit D, 25-Hydroxy 52.1 30.0 - 100.0 ng/mL  CBC with Diff  Result Value Ref Range   WBC 5.0 3.4 - 10.8 x10E3/uL   RBC 4.77 3.77 - 5.28 x10E6/uL   Hemoglobin 14.8 11.1 - 15.9 g/dL   Hematocrit 44.0 34.0 - 46.6 %   MCV 92 79 - 97 fL   MCH 31.0 26.6 - 33.0 pg   MCHC 33.6 31.5 - 35.7 g/dL   RDW 13.0 12.3 - 15.4 %   Platelets 299 150 - 379 x10E3/uL   Neutrophils 60 Not Estab. %   Lymphs 30 Not Estab. %   Monocytes 5 Not Estab. %   Eos 5 Not Estab. %   Basos 0 Not Estab. %   Neutrophils Absolute 3.0 1.4 - 7.0 x10E3/uL   Lymphocytes Absolute 1.5 0.7 - 3.1 x10E3/uL   Monocytes Absolute 0.3 0.1 - 0.9 x10E3/uL   EOS (ABSOLUTE) 0.3 0.0 - 0.4 x10E3/uL   Basophils Absolute  0.0 0.0 - 0.2 x10E3/uL    Immature Granulocytes 0 Not Estab. %   Immature Grans (Abs) 0.0 0.0 - 0.1 x10E3/uL  Comp Met (CMET)  Result Value Ref Range   Glucose 109 (H) 65 - 99 mg/dL   BUN 11 6 - 24 mg/dL   Creatinine, Ser 0.71 0.57 - 1.00 mg/dL   GFR calc non Af Amer 99 >59 mL/min/1.73   GFR calc Af Amer 114 >59 mL/min/1.73   BUN/Creatinine Ratio 15 9 - 23   Sodium 145 (H) 134 - 144 mmol/L   Potassium 4.6 3.5 - 5.2 mmol/L   Chloride 105 96 - 106 mmol/L   CO2 23 20 - 29 mmol/L   Calcium 9.6 8.7 - 10.2 mg/dL   Total Protein 6.9 6.0 - 8.5 g/dL   Albumin 4.5 3.5 - 5.5 g/dL   Globulin, Total 2.4 1.5 - 4.5 g/dL   Albumin/Globulin Ratio 1.9 1.2 - 2.2   Bilirubin Total 0.3 0.0 - 1.2 mg/dL   Alkaline Phosphatase 64 39 - 117 IU/L   AST 12 0 - 40 IU/L   ALT 11 0 - 32 IU/L      Assessment & Plan:   Problem List Items Addressed This Visit      Unprioritized   Benign hypertension    BP 130/80 on recheck.  This is stable.  DASH diet plan discussed and given.  Order written for lipid panel and TSH which will be done at her county clinic.  CMP was done recently and normal      Relevant Medications   lisinopril (PRINIVIL,ZESTRIL) 20 MG tablet   Other Relevant Orders   TSH   Lipid Panel Piccolo, Waived    Other Visit Diagnoses    Hypertension, unspecified type       Relevant Medications   lisinopril (PRINIVIL,ZESTRIL) 20 MG tablet       Follow up plan: Return in about 6 months (around 01/24/2018) for physical.

## 2017-07-24 NOTE — Assessment & Plan Note (Addendum)
BP 130/80 on recheck.  This is stable.  DASH diet plan discussed and given.  Order written for lipid panel and TSH which will be done at her county clinic.  CMP was done recently and normal

## 2017-07-24 NOTE — Patient Instructions (Signed)
DASH Eating Plan DASH stands for "Dietary Approaches to Stop Hypertension." The DASH eating plan is a healthy eating plan that has been shown to reduce high blood pressure (hypertension). It may also reduce your risk for type 2 diabetes, heart disease, and stroke. The DASH eating plan may also help with weight loss. What are tips for following this plan? General guidelines  Avoid eating more than 2,300 mg (milligrams) of salt (sodium) a day. If you have hypertension, you may need to reduce your sodium intake to 1,500 mg a day.  Limit alcohol intake to no more than 1 drink a day for nonpregnant women and 2 drinks a day for men. One drink equals 12 oz of beer, 5 oz of wine, or 1 oz of hard liquor.  Work with your health care provider to maintain a healthy body weight or to lose weight. Ask what an ideal weight is for you.  Get at least 30 minutes of exercise that causes your heart to beat faster (aerobic exercise) most days of the week. Activities may include walking, swimming, or biking.  Work with your health care provider or diet and nutrition specialist (dietitian) to adjust your eating plan to your individual calorie needs. Reading food labels  Check food labels for the amount of sodium per serving. Choose foods with less than 5 percent of the Daily Value of sodium. Generally, foods with less than 300 mg of sodium per serving fit into this eating plan.  To find whole grains, look for the word "whole" as the first word in the ingredient list. Shopping  Buy products labeled as "low-sodium" or "no salt added."  Buy fresh foods. Avoid canned foods and premade or frozen meals. Cooking  Avoid adding salt when cooking. Use salt-free seasonings or herbs instead of table salt or sea salt. Check with your health care provider or pharmacist before using salt substitutes.  Do not fry foods. Cook foods using healthy methods such as baking, boiling, grilling, and broiling instead.  Cook with  heart-healthy oils, such as olive, canola, soybean, or sunflower oil. Meal planning   Eat a balanced diet that includes: ? 5 or more servings of fruits and vegetables each day. At each meal, try to fill half of your plate with fruits and vegetables. ? Up to 6-8 servings of whole grains each day. ? Less than 6 oz of lean meat, poultry, or fish each day. A 3-oz serving of meat is about the same size as a deck of cards. One egg equals 1 oz. ? 2 servings of low-fat dairy each day. ? A serving of nuts, seeds, or beans 5 times each week. ? Heart-healthy fats. Healthy fats called Omega-3 fatty acids are found in foods such as flaxseeds and coldwater fish, like sardines, salmon, and mackerel.  Limit how much you eat of the following: ? Canned or prepackaged foods. ? Food that is high in trans fat, such as fried foods. ? Food that is high in saturated fat, such as fatty meat. ? Sweets, desserts, sugary drinks, and other foods with added sugar. ? Full-fat dairy products.  Do not salt foods before eating.  Try to eat at least 2 vegetarian meals each week.  Eat more home-cooked food and less restaurant, buffet, and fast food.  When eating at a restaurant, ask that your food be prepared with less salt or no salt, if possible. What foods are recommended? The items listed may not be a complete list. Talk with your dietitian about what   dietary choices are best for you. Grains Whole-grain or whole-wheat bread. Whole-grain or whole-wheat pasta. Brown rice. Oatmeal. Quinoa. Bulgur. Whole-grain and low-sodium cereals. Pita bread. Low-fat, low-sodium crackers. Whole-wheat flour tortillas. Vegetables Fresh or frozen vegetables (raw, steamed, roasted, or grilled). Low-sodium or reduced-sodium tomato and vegetable juice. Low-sodium or reduced-sodium tomato sauce and tomato paste. Low-sodium or reduced-sodium canned vegetables. Fruits All fresh, dried, or frozen fruit. Canned fruit in natural juice (without  added sugar). Meat and other protein foods Skinless chicken or turkey. Ground chicken or turkey. Pork with fat trimmed off. Fish and seafood. Egg whites. Dried beans, peas, or lentils. Unsalted nuts, nut butters, and seeds. Unsalted canned beans. Lean cuts of beef with fat trimmed off. Low-sodium, lean deli meat. Dairy Low-fat (1%) or fat-free (skim) milk. Fat-free, low-fat, or reduced-fat cheeses. Nonfat, low-sodium ricotta or cottage cheese. Low-fat or nonfat yogurt. Low-fat, low-sodium cheese. Fats and oils Soft margarine without trans fats. Vegetable oil. Low-fat, reduced-fat, or light mayonnaise and salad dressings (reduced-sodium). Canola, safflower, olive, soybean, and sunflower oils. Avocado. Seasoning and other foods Herbs. Spices. Seasoning mixes without salt. Unsalted popcorn and pretzels. Fat-free sweets. What foods are not recommended? The items listed may not be a complete list. Talk with your dietitian about what dietary choices are best for you. Grains Baked goods made with fat, such as croissants, muffins, or some breads. Dry pasta or rice meal packs. Vegetables Creamed or fried vegetables. Vegetables in a cheese sauce. Regular canned vegetables (not low-sodium or reduced-sodium). Regular canned tomato sauce and paste (not low-sodium or reduced-sodium). Regular tomato and vegetable juice (not low-sodium or reduced-sodium). Pickles. Olives. Fruits Canned fruit in a light or heavy syrup. Fried fruit. Fruit in cream or butter sauce. Meat and other protein foods Fatty cuts of meat. Ribs. Fried meat. Bacon. Sausage. Bologna and other processed lunch meats. Salami. Fatback. Hotdogs. Bratwurst. Salted nuts and seeds. Canned beans with added salt. Canned or smoked fish. Whole eggs or egg yolks. Chicken or turkey with skin. Dairy Whole or 2% milk, cream, and half-and-half. Whole or full-fat cream cheese. Whole-fat or sweetened yogurt. Full-fat cheese. Nondairy creamers. Whipped toppings.  Processed cheese and cheese spreads. Fats and oils Butter. Stick margarine. Lard. Shortening. Ghee. Bacon fat. Tropical oils, such as coconut, palm kernel, or palm oil. Seasoning and other foods Salted popcorn and pretzels. Onion salt, garlic salt, seasoned salt, table salt, and sea salt. Worcestershire sauce. Tartar sauce. Barbecue sauce. Teriyaki sauce. Soy sauce, including reduced-sodium. Steak sauce. Canned and packaged gravies. Fish sauce. Oyster sauce. Cocktail sauce. Horseradish that you find on the shelf. Ketchup. Mustard. Meat flavorings and tenderizers. Bouillon cubes. Hot sauce and Tabasco sauce. Premade or packaged marinades. Premade or packaged taco seasonings. Relishes. Regular salad dressings. Where to find more information:  National Heart, Lung, and Blood Institute: www.nhlbi.nih.gov  American Heart Association: www.heart.org Summary  The DASH eating plan is a healthy eating plan that has been shown to reduce high blood pressure (hypertension). It may also reduce your risk for type 2 diabetes, heart disease, and stroke.  With the DASH eating plan, you should limit salt (sodium) intake to 2,300 mg a day. If you have hypertension, you may need to reduce your sodium intake to 1,500 mg a day.  When on the DASH eating plan, aim to eat more fresh fruits and vegetables, whole grains, lean proteins, low-fat dairy, and heart-healthy fats.  Work with your health care provider or diet and nutrition specialist (dietitian) to adjust your eating plan to your individual   calorie needs. This information is not intended to replace advice given to you by your health care provider. Make sure you discuss any questions you have with your health care provider. Document Released: 02/14/2011 Document Revised: 02/19/2016 Document Reviewed: 02/19/2016 Elsevier Interactive Patient Education  2018 Elsevier Inc.  

## 2017-09-04 ENCOUNTER — Ambulatory Visit: Payer: Self-pay | Admitting: Family Medicine

## 2017-09-04 VITALS — BP 154/89 | HR 77 | Resp 16 | Ht 64.0 in | Wt 177.0 lb

## 2017-09-04 DIAGNOSIS — Z008 Encounter for other general examination: Secondary | ICD-10-CM

## 2017-09-04 DIAGNOSIS — Z0189 Encounter for other specified special examinations: Principal | ICD-10-CM

## 2017-09-04 NOTE — Addendum Note (Signed)
Addended by: Carlene Coria on: 09/04/2017 11:42 AM   Modules accepted: Orders

## 2017-09-04 NOTE — Progress Notes (Signed)
Subjective: Annual biometrics screening  Patient presents for her annual biometric screening lipid panel only.  Patient recently had a nonfasting blood sugar of 109 on 06/23/2017.  Patient recently had her annual physical exam with her primary care provider.  Patient reports eating a healthy, well-rounded diet and getting regular physical activity.  Patient regularly sees her primary care provider. Patient denies any other issues or concerns.   Assessment Annual biometrics screening  Plan  Lipid panel pending. Encouraged routine visits with primary care provider.  Patient's blood pressure is 154/89 today.  Discussed normal values.  Advised patient monitor this regularly and report abnormal values to her primary care provider.  Patient reports that she regularly monitors her blood pressure and that this morning it was 124/83. Encouraged patient to get regular exercise and eat a healthy, well-rounded diet.

## 2017-09-05 LAB — LIPID PANEL
CHOLESTEROL TOTAL: 200 mg/dL — AB (ref 100–199)
Chol/HDL Ratio: 3.4 ratio (ref 0.0–4.4)
HDL: 59 mg/dL (ref >39)
LDL CALC: 123 mg/dL — AB (ref 0–99)
Triglycerides: 91 mg/dL (ref 0–149)
VLDL CHOLESTEROL CAL: 18 mg/dL (ref 5–40)

## 2017-09-05 LAB — GLUCOSE, RANDOM: Glucose: 91 mg/dL (ref 65–99)

## 2017-09-08 NOTE — Progress Notes (Signed)
Dear Shannon Marsh, I wanted to let you know that your lipid panel and fasting blood sugar came back.  Everything is normal, with the exception of your total cholesterol and LDL cholesterol.  Your total cholesterol is just slightly elevated at 200, normal values are between 100 and 199. Your LDL cholesterol ("bad cholesterol") is elevated at 123, normal values are below 99. I want you to follow-up with your primary care provider regarding these results.

## 2017-10-28 ENCOUNTER — Ambulatory Visit: Payer: Managed Care, Other (non HMO) | Admitting: Gynecology

## 2017-10-28 ENCOUNTER — Encounter: Payer: Self-pay | Admitting: Gynecology

## 2017-10-28 VITALS — BP 124/80

## 2017-10-28 DIAGNOSIS — N6002 Solitary cyst of left breast: Secondary | ICD-10-CM | POA: Diagnosis not present

## 2017-10-28 NOTE — Progress Notes (Signed)
    Shannon Marsh 08-02-1965 [id number]        52 y.o.  [id number] presents having noticed a lump in her left breast several days ago.  Seems a little smaller than when she first noticed it.  Slightly tender.  No nipple discharge.  No other masses.  Last mammogram 2017.  Past medical history,surgical history, problem list, medications, allergies, family history and social history were all reviewed and documented in the EPIC chart.  Directed ROS with pertinent positives and negatives documented in the history of present illness/assessment and plan.  Exam: [name] assistant Vitals:   10/28/17 0929  BP: 124/80   General appearance:  Normal Both breasts examined lying and sitting.  Left with 2 to 3 cm cystic feeling mass tail of Spence region.  No overlying skin changes.  No other masses, nipple discharge or axillary adenopathy.  Right without masses, retractions, discharge, adenopathy.  Physical Exam  Pulmonary/Chest:      Procedure: The skin overlying the mass was cleansed with alcohol, infiltrated with 1% lidocaine and using an 18-gauge needle the cyst was entered and emptied of clear yellow fluid, 3 to 4 cc which was discarded.  Exam afterwards showed the mass is completely resolved.  Band-Aid applied afterwards.  Assessment/Plan:  52 y.o. [id number] with left breast cyst as above.  Discussed options for aspiration and she wanted me to go ahead and do this now.  Recommended waiting a month or so to allow inflammatory changes to resolve and then having a mammogram as she is overdue and she will call and arrange this.  She is due for her annual exam in November and she will also follow-up at that time.    Anastasio Auerbach MD, 9:41 AM 10/28/2017

## 2017-10-28 NOTE — Patient Instructions (Signed)
Schedule your mammogram in another month or so.  Follow-up for annual exam in November.

## 2018-01-21 ENCOUNTER — Other Ambulatory Visit: Payer: Self-pay | Admitting: Unknown Physician Specialty

## 2018-01-21 DIAGNOSIS — I10 Essential (primary) hypertension: Secondary | ICD-10-CM

## 2018-01-21 NOTE — Telephone Encounter (Signed)
Requested Prescriptions  Pending Prescriptions Disp Refills  . lisinopril (PRINIVIL,ZESTRIL) 20 MG tablet [Pharmacy Med Name: LISINOPRIL 20 MG TAB] 90 tablet 0    Sig: TAKE 1 TABLET BY MOUTH ONCE DAILY     Cardiovascular:  ACE Inhibitors Failed - 01/21/2018  9:43 AM      Failed - Cr in normal range and within 180 days    Creatinine  Date Value Ref Range Status  03/12/2014 0.60 0.60 - 1.30 mg/dL Final   Creatinine, Ser  Date Value Ref Range Status  06/23/2017 0.71 0.57 - 1.00 mg/dL Final         Failed - K in normal range and within 180 days    Potassium  Date Value Ref Range Status  06/23/2017 4.6 3.5 - 5.2 mmol/L Final  03/12/2014 3.6 3.5 - 5.1 mmol/L Final         Failed - Valid encounter within last 6 months    Recent Outpatient Visits          6 months ago Benign hypertension   [facility], NP      Future Appointments            In 1 week Venita Lick, NP [facility], [place] - Patient is not pregnant      Passed - Last BP in normal range    BP Readings from Last 1 Encounters:  10/28/17 124/80

## 2018-01-28 ENCOUNTER — Encounter: Payer: Self-pay | Admitting: Nurse Practitioner

## 2018-01-28 ENCOUNTER — Ambulatory Visit (INDEPENDENT_AMBULATORY_CARE_PROVIDER_SITE_OTHER): Payer: Managed Care, Other (non HMO) | Admitting: Nurse Practitioner

## 2018-01-28 ENCOUNTER — Other Ambulatory Visit: Payer: Self-pay

## 2018-01-28 ENCOUNTER — Encounter: Payer: Managed Care, Other (non HMO) | Admitting: Unknown Physician Specialty

## 2018-01-28 VITALS — BP 132/80 | HR 93 | Temp 98.6°F | Ht 64.0 in | Wt 182.0 lb

## 2018-01-28 DIAGNOSIS — I10 Essential (primary) hypertension: Secondary | ICD-10-CM

## 2018-01-28 DIAGNOSIS — N6002 Solitary cyst of left breast: Secondary | ICD-10-CM | POA: Diagnosis not present

## 2018-01-28 DIAGNOSIS — Z0000 Encounter for general adult medical examination without abnormal findings: Secondary | ICD-10-CM

## 2018-01-28 NOTE — Assessment & Plan Note (Signed)
Chronic with white coat syndrome, initial BP elevated but repeat below 130/90 goal.  Home BP below goal.  Continue Lisinopril 20 MG daily and DASH diet.  Labs in April.

## 2018-01-28 NOTE — Progress Notes (Signed)
BP 132/80 (BP Location: Left Arm, Patient Position: Sitting)   Pulse 93   Temp 98.6 F (37 C) (Oral)   Ht 5\' 4"  (1.626 m)   Wt 182 lb (82.6 kg)   LMP 02/18/2012   SpO2 98%   BMI 31.24 kg/m    Subjective:    Patient ID: Shannon Marsh, female    DOB: 1965-11-25, 52 y.o.   MRN: [id number]  HPI: Shannon Marsh is a 52 y.o. female presents for annual physical.  Obtains annual pap and mammograms via Dr. Phineas Real office.  Has cyst left breast monitored by Dr. Phineas Real.  Chief Complaint  Patient presents with  . Annual Exam  . Hypertension    70m f/u   HYPERTENSION Has youngest daughter get married recently and increased stressors and has not been eating as well as usual.  Also retired beginning of this year.  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 Hypertension status: stable  Satisfied with current treatment? yes Duration of hypertension: chronic BP monitoring frequency:  daily BP range: Yesterday 140/88 with repeat 124/84, often is in 120/80 range at home BP medication side effects:  no Medication compliance: excellent compliance Previous BP meds: none Aspirin: no Recurrent headaches: no Visual changes: no Palpitations: no Dyspnea: no Chest pain: no Lower extremity edema: no Dizzy/lightheaded: no   Functional Status Survey: Is the patient deaf or have difficulty hearing?: No Does the patient have difficulty seeing, even when wearing glasses/contacts?: No Does the patient have difficulty concentrating, remembering, or making decisions?: No Does the patient have difficulty walking or climbing stairs?: No Does the patient have difficulty dressing or bathing?: No Does the patient have difficulty doing errands alone such as visiting a doctor's office or shopping?: No  Depression screen [facility] 2/9 01/28/2018 07/24/2017  Decreased Interest 0 0  Down, Depressed, Hopeless 0 0  PHQ - 2 Score 0 0  Altered sleeping 1 -  Tired, decreased energy 1 -    Change in appetite 2 -  Feeling bad or failure about yourself  1 -  Trouble concentrating 0 -  Moving slowly or fidgety/restless 0 -  Suicidal thoughts 0 -  PHQ-9 Score 5 -  Difficult doing work/chores Not difficult at all -   GAD 7 : Generalized Anxiety Score 01/28/2018  Nervous, Anxious, on Edge 0  Control/stop worrying 1  Worry too much - different things 1  Trouble relaxing 1  Restless 0  Easily annoyed or irritable 0  Afraid - awful might happen 0  Total GAD 7 Score 3  Anxiety Difficulty Not difficult at all   Social History   Socioeconomic History  . Marital status: Married    Spouse name: Not on file  . Number of children: Not on file  . Years of education: Not on file  . Highest education level: Not on file  Occupational History  . Not on file  Social Needs  . Financial resource strain: Not on file  . Food insecurity:    Worry: Not on file    Inability: Not on file  . Transportation needs:    Medical: Not on file    Non-medical: Not on file  Tobacco Use  . Smoking status: Never Smoker  . Smokeless tobacco: Never Used  Substance and Sexual Activity  . Alcohol use: Yes    Alcohol/week: 3.0 standard drinks    Types: 3 Standard drinks or equivalent per week  . Drug use: No  . Sexual activity: Yes  Partners: Male    Birth control/protection: Surgical    Comment: 1st intercourse 52 yo-Fewer than 5 partners  Lifestyle  . Physical activity:    Days per week: Not on file    Minutes per session: Not on file  . Stress: Not on file  Relationships  . Social connections:    Talks on phone: Not on file    Gets together: Not on file    Attends religious service: Not on file    Active member of club or organization: Not on file    Attends meetings of clubs or organizations: Not on file    Relationship status: Not on file  . Intimate partner violence:    Fear of current or ex partner: Not on file    Emotionally abused: Not on file    Physically abused: Not  on file    Forced sexual activity: Not on file  Other Topics Concern  . Not on file  Social History Narrative  . Not on file     Relevant past medical, surgical, family and social history reviewed and updated as indicated. Interim medical history since our last visit reviewed. Allergies and medications reviewed and updated.  Review of Systems  Constitutional: Negative for activity change, appetite change, diaphoresis, fatigue and fever.  HENT: Negative.   Eyes: Negative.   Respiratory: Negative for cough, chest tightness and shortness of breath.   Cardiovascular: Negative for chest pain, palpitations and leg swelling.  Gastrointestinal: Negative for abdominal distention, abdominal pain, constipation, diarrhea, nausea and vomiting.  Endocrine: Negative.   Genitourinary: Negative.   Musculoskeletal: Negative.   Skin: Negative.   Allergic/Immunologic: Negative.   Neurological: Negative for dizziness, numbness and headaches.  Hematological: Negative.   Psychiatric/Behavioral: Negative.     Per HPI unless specifically indicated above     Objective:    BP 132/80 (BP Location: Left Arm, Patient Position: Sitting)   Pulse 93   Temp 98.6 F (37 C) (Oral)   Ht 5\' 4"  (1.626 m)   Wt 182 lb (82.6 kg)   LMP 02/18/2012   SpO2 98%   BMI 31.24 kg/m   Wt Readings from Last 3 Encounters:  01/28/18 182 lb (82.6 kg)  09/04/17 177 lb (80.3 kg)  07/24/17 178 lb 11.2 oz (81.1 kg)    Physical Exam  Constitutional: She is oriented to person, place, and time. She appears well-developed and well-nourished.  HENT:  Head: Normocephalic and atraumatic.  Right Ear: Hearing, tympanic membrane, external ear and ear canal normal.  Left Ear: Hearing, tympanic membrane, external ear and ear canal normal.  Nose: Nose normal. Right sinus exhibits no maxillary sinus tenderness and no frontal sinus tenderness. Left sinus exhibits no maxillary sinus tenderness and no frontal sinus tenderness.   Mouth/Throat: Oropharynx is clear and moist.  Eyes: Pupils are equal, round, and reactive to light. Conjunctivae and EOM are normal. Right eye exhibits no discharge. Left eye exhibits no discharge.  Neck: Normal range of motion. Neck supple. No JVD present. Carotid bruit is not present. No thyromegaly present.  Cardiovascular: Normal rate, regular rhythm, normal heart sounds and intact distal pulses.  Pulmonary/Chest: Effort normal and breath sounds normal. Right breast exhibits no inverted nipple, no mass, no nipple discharge, no skin change and no tenderness. Left breast exhibits mass. Left breast exhibits no inverted nipple, no nipple discharge, no skin change and no tenderness.    Abdominal: Soft. Bowel sounds are normal. There is no splenomegaly or hepatomegaly.  Musculoskeletal: Normal range  of motion.  Lymphadenopathy:    She has no cervical adenopathy.  Neurological: She is alert and oriented to person, place, and time. She has normal reflexes.  Reflex Scores:      Brachioradialis reflexes are 2+ on the right side and 2+ on the left side.      Patellar reflexes are 2+ on the right side and 2+ on the left side. Skin: Skin is warm and dry.  Psychiatric: She has a normal mood and affect. Her behavior is normal.  Nursing note and vitals reviewed.   Results for orders placed or performed in visit on 09/04/17  Lipid panel  Result Value Ref Range   Cholesterol, Total 200 (H) 100 - 199 mg/dL   Triglycerides 91 0 - 149 mg/dL   HDL 59 >39 mg/dL   VLDL Cholesterol Cal 18 5 - 40 mg/dL   LDL Calculated 123 (H) 0 - 99 mg/dL   Chol/HDL Ratio 3.4 0.0 - 4.4 ratio  Glucose  Result Value Ref Range   Glucose 91 65 - 99 mg/dL      Assessment & Plan:   Problem List Items Addressed This Visit      Cardiovascular and Mediastinum   Benign hypertension    Chronic with white coat syndrome, initial BP elevated but repeat below 130/90 goal.  Home BP below goal.  Continue Lisinopril 20 MG daily  and DASH diet.  Labs in April.        Other   Cyst of left breast    Ongoing, drained in August 2019 by Dr. Phineas Real.  Continue to collaborate with Dr. Phineas Real.          Follow up plan: Return in about 4 months (around 05/29/2018) for HTN with lab order to print.

## 2018-01-28 NOTE — Patient Instructions (Signed)
DASH Eating Plan DASH stands for "Dietary Approaches to Stop Hypertension." The DASH eating plan is a healthy eating plan that has been shown to reduce high blood pressure (hypertension). It may also reduce your risk for type 2 diabetes, heart disease, and stroke. The DASH eating plan may also help with weight loss. What are tips for following this plan? General guidelines  Avoid eating more than 2,300 mg (milligrams) of salt (sodium) a day. If you have hypertension, you may need to reduce your sodium intake to 1,500 mg a day.  Limit alcohol intake to no more than 1 drink a day for nonpregnant women and 2 drinks a day for men. One drink equals 12 oz of beer, 5 oz of wine, or 1 oz of hard liquor.  Work with your health care provider to maintain a healthy body weight or to lose weight. Ask what an ideal weight is for you.  Get at least 30 minutes of exercise that causes your heart to beat faster (aerobic exercise) most days of the week. Activities may include walking, swimming, or biking.  Work with your health care provider or diet and nutrition specialist (dietitian) to adjust your eating plan to your individual calorie needs. Reading food labels  Check food labels for the amount of sodium per serving. Choose foods with less than 5 percent of the Daily Value of sodium. Generally, foods with less than 300 mg of sodium per serving fit into this eating plan.  To find whole grains, look for the word "whole" as the first word in the ingredient list. Shopping  Buy products labeled as "low-sodium" or "no salt added."  Buy fresh foods. Avoid canned foods and premade or frozen meals. Cooking  Avoid adding salt when cooking. Use salt-free seasonings or herbs instead of table salt or sea salt. Check with your health care provider or pharmacist before using salt substitutes.  Do not fry foods. Cook foods using healthy methods such as baking, boiling, grilling, and broiling instead.  Cook with  heart-healthy oils, such as olive, canola, soybean, or sunflower oil. Meal planning   Eat a balanced diet that includes: ? 5 or more servings of fruits and vegetables each day. At each meal, try to fill half of your plate with fruits and vegetables. ? Up to 6-8 servings of whole grains each day. ? Less than 6 oz of lean meat, poultry, or fish each day. A 3-oz serving of meat is about the same size as a deck of cards. One egg equals 1 oz. ? 2 servings of low-fat dairy each day. ? A serving of nuts, seeds, or beans 5 times each week. ? Heart-healthy fats. Healthy fats called Omega-3 fatty acids are found in foods such as flaxseeds and coldwater fish, like sardines, salmon, and mackerel.  Limit how much you eat of the following: ? Canned or prepackaged foods. ? Food that is high in trans fat, such as fried foods. ? Food that is high in saturated fat, such as fatty meat. ? Sweets, desserts, sugary drinks, and other foods with added sugar. ? Full-fat dairy products.  Do not salt foods before eating.  Try to eat at least 2 vegetarian meals each week.  Eat more home-cooked food and less restaurant, buffet, and fast food.  When eating at a restaurant, ask that your food be prepared with less salt or no salt, if possible. What foods are recommended? The items listed may not be a complete list. Talk with your dietitian about what   dietary choices are best for you. Grains Whole-grain or whole-wheat bread. Whole-grain or whole-wheat pasta. Brown rice. Oatmeal. Quinoa. Bulgur. Whole-grain and low-sodium cereals. Pita bread. Low-fat, low-sodium crackers. Whole-wheat flour tortillas. Vegetables Fresh or frozen vegetables (raw, steamed, roasted, or grilled). Low-sodium or reduced-sodium tomato and vegetable juice. Low-sodium or reduced-sodium tomato sauce and tomato paste. Low-sodium or reduced-sodium canned vegetables. Fruits All fresh, dried, or frozen fruit. Canned fruit in natural juice (without  added sugar). Meat and other protein foods Skinless chicken or turkey. Ground chicken or turkey. Pork with fat trimmed off. Fish and seafood. Egg whites. Dried beans, peas, or lentils. Unsalted nuts, nut butters, and seeds. Unsalted canned beans. Lean cuts of beef with fat trimmed off. Low-sodium, lean deli meat. Dairy Low-fat (1%) or fat-free (skim) milk. Fat-free, low-fat, or reduced-fat cheeses. Nonfat, low-sodium ricotta or cottage cheese. Low-fat or nonfat yogurt. Low-fat, low-sodium cheese. Fats and oils Soft margarine without trans fats. Vegetable oil. Low-fat, reduced-fat, or light mayonnaise and salad dressings (reduced-sodium). Canola, safflower, olive, soybean, and sunflower oils. Avocado. Seasoning and other foods Herbs. Spices. Seasoning mixes without salt. Unsalted popcorn and pretzels. Fat-free sweets. What foods are not recommended? The items listed may not be a complete list. Talk with your dietitian about what dietary choices are best for you. Grains Baked goods made with fat, such as croissants, muffins, or some breads. Dry pasta or rice meal packs. Vegetables Creamed or fried vegetables. Vegetables in a cheese sauce. Regular canned vegetables (not low-sodium or reduced-sodium). Regular canned tomato sauce and paste (not low-sodium or reduced-sodium). Regular tomato and vegetable juice (not low-sodium or reduced-sodium). Pickles. Olives. Fruits Canned fruit in a light or heavy syrup. Fried fruit. Fruit in cream or butter sauce. Meat and other protein foods Fatty cuts of meat. Ribs. Fried meat. Bacon. Sausage. Bologna and other processed lunch meats. Salami. Fatback. Hotdogs. Bratwurst. Salted nuts and seeds. Canned beans with added salt. Canned or smoked fish. Whole eggs or egg yolks. Chicken or turkey with skin. Dairy Whole or 2% milk, cream, and half-and-half. Whole or full-fat cream cheese. Whole-fat or sweetened yogurt. Full-fat cheese. Nondairy creamers. Whipped toppings.  Processed cheese and cheese spreads. Fats and oils Butter. Stick margarine. Lard. Shortening. Ghee. Bacon fat. Tropical oils, such as coconut, palm kernel, or palm oil. Seasoning and other foods Salted popcorn and pretzels. Onion salt, garlic salt, seasoned salt, table salt, and sea salt. Worcestershire sauce. Tartar sauce. Barbecue sauce. Teriyaki sauce. Soy sauce, including reduced-sodium. Steak sauce. Canned and packaged gravies. Fish sauce. Oyster sauce. Cocktail sauce. Horseradish that you find on the shelf. Ketchup. Mustard. Meat flavorings and tenderizers. Bouillon cubes. Hot sauce and Tabasco sauce. Premade or packaged marinades. Premade or packaged taco seasonings. Relishes. Regular salad dressings. Where to find more information:  National Heart, Lung, and Blood Institute: www.nhlbi.nih.gov  American Heart Association: www.heart.org Summary  The DASH eating plan is a healthy eating plan that has been shown to reduce high blood pressure (hypertension). It may also reduce your risk for type 2 diabetes, heart disease, and stroke.  With the DASH eating plan, you should limit salt (sodium) intake to 2,300 mg a day. If you have hypertension, you may need to reduce your sodium intake to 1,500 mg a day.  When on the DASH eating plan, aim to eat more fresh fruits and vegetables, whole grains, lean proteins, low-fat dairy, and heart-healthy fats.  Work with your health care provider or diet and nutrition specialist (dietitian) to adjust your eating plan to your individual   calorie needs. This information is not intended to replace advice given to you by your health care provider. Make sure you discuss any questions you have with your health care provider. Document Released: 02/14/2011 Document Revised: 02/19/2016 Document Reviewed: 02/19/2016 Elsevier Interactive Patient Education  2018 Elsevier Inc.  

## 2018-01-28 NOTE — Assessment & Plan Note (Signed)
Ongoing, drained in August 2019 by Dr. Phineas Real.  Continue to collaborate with Dr. Phineas Real.

## 2018-02-12 ENCOUNTER — Encounter: Payer: Managed Care, Other (non HMO) | Admitting: Gynecology

## 2018-03-17 ENCOUNTER — Ambulatory Visit: Payer: Self-pay

## 2018-03-17 NOTE — Telephone Encounter (Signed)
Pt. Reports she has an up coming foot surgery and is having some anxiety with that. BP at home 153/100. Taking her medications as prescribed. No other symptoms associated with this. Is seeing her Podiatrist today. Instructed to let him know about her anxiety about her surgery. Appointment made with her provider for BP check and to talk about her surgery. Instructed to call back if she develops symptoms like headache or blurred vision. Verbalizes understanding.  Reason for Disposition . [1] Systolic BP  >= 610 OR Diastolic >= 80 AND [9] taking BP medications  Answer Assessment - Initial Assessment Questions 1. BLOOD PRESSURE: "What is the blood pressure?" "Did you take at least two measurements 5 minutes apart?"     153/100  Pulse 70 2. ONSET: "When did you take your blood pressure?"       This morning 0800 3. HOW: "How did you obtain the blood pressure?" (e.g., visiting nurse, automatic home BP monitor)     Home BP monitor 4. HISTORY: "Do you have a history of high blood pressure?"     Yes 5. MEDICATIONS: "Are you taking any medications for blood pressure?" "Have you missed any doses recently?"     No 6. OTHER SYMPTOMS: "Do you have any symptoms?" (e.g., headache, chest pain, blurred vision, difficulty breathing, weakness)     Anxiety - up coming surgery 7. PREGNANCY: "Is there any chance you are pregnant?" "When was your last menstrual period?"     No  Protocols used: HIGH BLOOD PRESSURE-A-AH

## 2018-03-18 ENCOUNTER — Other Ambulatory Visit: Payer: Self-pay | Admitting: Podiatry

## 2018-03-18 NOTE — Anesthesia Preprocedure Evaluation (Addendum)
Anesthesia Evaluation  Patient identified by MRN, date of birth, ID band Patient awake    Reviewed: Allergy & Precautions, NPO status , Patient's Chart, lab work & pertinent test results  History of Anesthesia Complications Negative for: history of anesthetic complications  Airway Mallampati: III   Neck ROM: Full    Dental no notable dental hx.    Pulmonary neg pulmonary ROS,    Pulmonary exam normal breath sounds clear to auscultation       Cardiovascular hypertension, Normal cardiovascular exam Rhythm:Regular Rate:Normal     Neuro/Psych negative neurological ROS     GI/Hepatic negative GI ROS,   Endo/Other  diabetes (hx gestational DM)  Renal/GU negative Renal ROS     Musculoskeletal   Abdominal   Peds  Hematology  (+) Blood dyscrasia, anemia ,   Anesthesia Other Findings   Reproductive/Obstetrics                            Anesthesia Physical Anesthesia Plan  ASA: II  Anesthesia Plan: General   Post-op Pain Management:    Induction: Intravenous  PONV Risk Score and Plan: 3 and Dexamethasone and Ondansetron  Airway Management Planned: Oral ETT  Additional Equipment:   Intra-op Plan:   Post-operative Plan: Extubation in OR  Informed Consent: I have reviewed the patients History and Physical, chart, labs and discussed the procedure including the risks, benefits and alternatives for the proposed anesthesia with the patient or authorized representative who has indicated his/her understanding and acceptance.       Plan Discussed with: CRNA  Anesthesia Plan Comments:        Anesthesia Quick Evaluation

## 2018-03-19 ENCOUNTER — Encounter: Payer: Self-pay | Admitting: Nurse Practitioner

## 2018-03-19 ENCOUNTER — Ambulatory Visit (INDEPENDENT_AMBULATORY_CARE_PROVIDER_SITE_OTHER): Payer: Managed Care, Other (non HMO) | Admitting: Nurse Practitioner

## 2018-03-19 VITALS — BP 140/92 | HR 73 | Temp 97.8°F | Wt 187.6 lb

## 2018-03-19 DIAGNOSIS — I10 Essential (primary) hypertension: Secondary | ICD-10-CM

## 2018-03-19 DIAGNOSIS — E669 Obesity, unspecified: Secondary | ICD-10-CM | POA: Insufficient documentation

## 2018-03-19 MED ORDER — LISINOPRIL 30 MG PO TABS: 30.0000 mg | ORAL_TABLET | Freq: Every day | ORAL | 2 refills | Status: DC

## 2018-03-19 NOTE — Assessment & Plan Note (Signed)
Current BMI 32.20.  Continue to focus on diet and exercise at home.

## 2018-03-19 NOTE — Assessment & Plan Note (Signed)
Chronic, with report of increased readings at home.  Has upcoming surgery and underlying white coat syndrome, possibly anxiety related.  Due to increase readings at home and in office today, repeat improved but still elevated, will increase Lisinopril to 30 MG daily and have patient return in 6 weeks for recheck and BMP.

## 2018-03-19 NOTE — Patient Instructions (Signed)
DASH Eating Plan  DASH stands for "Dietary Approaches to Stop Hypertension." The DASH eating plan is a healthy eating plan that has been shown to reduce high blood pressure (hypertension). It may also reduce your risk for type 2 diabetes, heart disease, and stroke. The DASH eating plan may also help with weight loss.  What are tips for following this plan?    General guidelines   Avoid eating more than 2,300 mg (milligrams) of salt (sodium) a day. If you have hypertension, you may need to reduce your sodium intake to 1,500 mg a day.   Limit alcohol intake to no more than 1 drink a day for nonpregnant women and 2 drinks a day for men. One drink equals 12 oz of beer, 5 oz of wine, or 1 oz of hard liquor.   Work with your health care provider to maintain a healthy body weight or to lose weight. Ask what an ideal weight is for you.   Get at least 30 minutes of exercise that causes your heart to beat faster (aerobic exercise) most days of the week. Activities may include walking, swimming, or biking.   Work with your health care provider or diet and nutrition specialist (dietitian) to adjust your eating plan to your individual calorie needs.  Reading food labels     Check food labels for the amount of sodium per serving. Choose foods with less than 5 percent of the Daily Value of sodium. Generally, foods with less than 300 mg of sodium per serving fit into this eating plan.   To find whole grains, look for the word "whole" as the first word in the ingredient list.  Shopping   Buy products labeled as "low-sodium" or "no salt added."   Buy fresh foods. Avoid canned foods and premade or frozen meals.  Cooking   Avoid adding salt when cooking. Use salt-free seasonings or herbs instead of table salt or sea salt. Check with your health care provider or pharmacist before using salt substitutes.   Do not fry foods. Cook foods using healthy methods such as baking, boiling, grilling, and broiling instead.   Cook with  heart-healthy oils, such as olive, canola, soybean, or sunflower oil.  Meal planning   Eat a balanced diet that includes:  ? 5 or more servings of fruits and vegetables each day. At each meal, try to fill half of your plate with fruits and vegetables.  ? Up to 6-8 servings of whole grains each day.  ? Less than 6 oz of lean meat, poultry, or fish each day. A 3-oz serving of meat is about the same size as a deck of cards. One egg equals 1 oz.  ? 2 servings of low-fat dairy each day.  ? A serving of nuts, seeds, or beans 5 times each week.  ? Heart-healthy fats. Healthy fats called Omega-3 fatty acids are found in foods such as flaxseeds and coldwater fish, like sardines, salmon, and mackerel.   Limit how much you eat of the following:  ? Canned or prepackaged foods.  ? Food that is high in trans fat, such as fried foods.  ? Food that is high in saturated fat, such as fatty meat.  ? Sweets, desserts, sugary drinks, and other foods with added sugar.  ? Full-fat dairy products.   Do not salt foods before eating.   Try to eat at least 2 vegetarian meals each week.   Eat more home-cooked food and less restaurant, buffet, and fast food.     When eating at a restaurant, ask that your food be prepared with less salt or no salt, if possible.  What foods are recommended?  The items listed may not be a complete list. Talk with your dietitian about what dietary choices are best for you.  Grains  Whole-grain or whole-wheat bread. Whole-grain or whole-wheat pasta. Brown rice. Oatmeal. Quinoa. Bulgur. Whole-grain and low-sodium cereals. Pita bread. Low-fat, low-sodium crackers. Whole-wheat flour tortillas.  Vegetables  Fresh or frozen vegetables (raw, steamed, roasted, or grilled). Low-sodium or reduced-sodium tomato and vegetable juice. Low-sodium or reduced-sodium tomato sauce and tomato paste. Low-sodium or reduced-sodium canned vegetables.  Fruits  All fresh, dried, or frozen fruit. Canned fruit in natural juice (without  added sugar).  Meat and other protein foods  Skinless chicken or turkey. Ground chicken or turkey. Pork with fat trimmed off. Fish and seafood. Egg whites. Dried beans, peas, or lentils. Unsalted nuts, nut butters, and seeds. Unsalted canned beans. Lean cuts of beef with fat trimmed off. Low-sodium, lean deli meat.  Dairy  Low-fat (1%) or fat-free (skim) milk. Fat-free, low-fat, or reduced-fat cheeses. Nonfat, low-sodium ricotta or cottage cheese. Low-fat or nonfat yogurt. Low-fat, low-sodium cheese.  Fats and oils  Soft margarine without trans fats. Vegetable oil. Low-fat, reduced-fat, or light mayonnaise and salad dressings (reduced-sodium). Canola, safflower, olive, soybean, and sunflower oils. Avocado.  Seasoning and other foods  Herbs. Spices. Seasoning mixes without salt. Unsalted popcorn and pretzels. Fat-free sweets.  What foods are not recommended?  The items listed may not be a complete list. Talk with your dietitian about what dietary choices are best for you.  Grains  Baked goods made with fat, such as croissants, muffins, or some breads. Dry pasta or rice meal packs.  Vegetables  Creamed or fried vegetables. Vegetables in a cheese sauce. Regular canned vegetables (not low-sodium or reduced-sodium). Regular canned tomato sauce and paste (not low-sodium or reduced-sodium). Regular tomato and vegetable juice (not low-sodium or reduced-sodium). Pickles. Olives.  Fruits  Canned fruit in a light or heavy syrup. Fried fruit. Fruit in cream or butter sauce.  Meat and other protein foods  Fatty cuts of meat. Ribs. Fried meat. Bacon. Sausage. Bologna and other processed lunch meats. Salami. Fatback. Hotdogs. Bratwurst. Salted nuts and seeds. Canned beans with added salt. Canned or smoked fish. Whole eggs or egg yolks. Chicken or turkey with skin.  Dairy  Whole or 2% milk, cream, and half-and-half. Whole or full-fat cream cheese. Whole-fat or sweetened yogurt. Full-fat cheese. Nondairy creamers. Whipped toppings.  Processed cheese and cheese spreads.  Fats and oils  Butter. Stick margarine. Lard. Shortening. Ghee. Bacon fat. Tropical oils, such as coconut, palm kernel, or palm oil.  Seasoning and other foods  Salted popcorn and pretzels. Onion salt, garlic salt, seasoned salt, table salt, and sea salt. Worcestershire sauce. Tartar sauce. Barbecue sauce. Teriyaki sauce. Soy sauce, including reduced-sodium. Steak sauce. Canned and packaged gravies. Fish sauce. Oyster sauce. Cocktail sauce. Horseradish that you find on the shelf. Ketchup. Mustard. Meat flavorings and tenderizers. Bouillon cubes. Hot sauce and Tabasco sauce. Premade or packaged marinades. Premade or packaged taco seasonings. Relishes. Regular salad dressings.  Where to find more information:   National Heart, Lung, and Blood Institute: www.nhlbi.nih.gov   American Heart Association: www.heart.org  Summary   The DASH eating plan is a healthy eating plan that has been shown to reduce high blood pressure (hypertension). It may also reduce your risk for type 2 diabetes, heart disease, and stroke.   With the   DASH eating plan, you should limit salt (sodium) intake to 2,300 mg a day. If you have hypertension, you may need to reduce your sodium intake to 1,500 mg a day.   When on the DASH eating plan, aim to eat more fresh fruits and vegetables, whole grains, lean proteins, low-fat dairy, and heart-healthy fats.   Work with your health care provider or diet and nutrition specialist (dietitian) to adjust your eating plan to your individual calorie needs.  This information is not intended to replace advice given to you by your health care provider. Make sure you discuss any questions you have with your health care provider.  Document Released: 02/14/2011 Document Revised: 02/19/2016 Document Reviewed: 02/19/2016  [employer] Interactive Patient Education  2019 [employer]

## 2018-03-19 NOTE — Progress Notes (Signed)
BP (!) 140/92 (BP Location: Left Arm, Patient Position: Sitting)   Pulse 73   Temp 97.8 F (36.6 C) (Oral)   Wt 187 lb 9.6 oz (85.1 kg)   LMP 02/18/2012   SpO2 98%   BMI 32.20 kg/m    Subjective:    Patient ID: Shannon Marsh, female    DOB: 1965/12/11, 53 y.o.   MRN: [id number]  HPI: Shannon Marsh is a 53 y.o. female  Chief Complaint  Patient presents with  . Hypertension    pt states her BP has been elevated with home readings   HYPERTENSION Has upcoming foot surgery and is concerned at BP readings have been higher at home.  Currently on Lisinopril 20 MG daily without ADR. Hypertension status: uncontrolled  Satisfied with current treatment? yes Duration of hypertension: chronic BP monitoring frequency:  daily BP range: 140-150/90's BP medication side effects:  no Medication compliance: excellent compliance Aspirin: no Recurrent headaches: reports only "occasional" headaches per baseline Visual changes: no Palpitations: no Dyspnea: no Chest pain: no Lower extremity edema: no Dizzy/lightheaded: no  Relevant past medical, surgical, family and social history reviewed and updated as indicated. Interim medical history since our last visit reviewed. Allergies and medications reviewed and updated.  Review of Systems  Constitutional: Negative for activity change, appetite change, diaphoresis, fatigue and fever.  Respiratory: Negative for cough, chest tightness and shortness of breath.   Cardiovascular: Negative for chest pain, palpitations and leg swelling.  Gastrointestinal: Negative for abdominal distention, abdominal pain, constipation, diarrhea, nausea and vomiting.  Endocrine: Negative for cold intolerance, heat intolerance, polydipsia, polyphagia and polyuria.  Neurological: Negative for dizziness, syncope, weakness, light-headedness, numbness and headaches.  Psychiatric/Behavioral: Negative.     Per HPI unless specifically indicated above       Objective:    BP (!) 140/92 (BP Location: Left Arm, Patient Position: Sitting)   Pulse 73   Temp 97.8 F (36.6 C) (Oral)   Wt 187 lb 9.6 oz (85.1 kg)   LMP 02/18/2012   SpO2 98%   BMI 32.20 kg/m   Wt Readings from Last 3 Encounters:  03/19/18 187 lb 9.6 oz (85.1 kg)  01/28/18 182 lb (82.6 kg)  09/04/17 177 lb (80.3 kg)    Physical Exam Vitals signs and nursing note reviewed.  Constitutional:      General: She is awake.     Appearance: She is well-developed.  HENT:     Head: Normocephalic.     Right Ear: Hearing normal.     Left Ear: Hearing normal.     Nose: Nose normal.     Mouth/Throat:     Mouth: Mucous membranes are moist.  Eyes:     General: Lids are normal.        Right eye: No discharge.        Left eye: No discharge.     Conjunctiva/sclera: Conjunctivae normal.     Pupils: Pupils are equal, round, and reactive to light.  Neck:     Musculoskeletal: Normal range of motion and neck supple.     Thyroid: No thyromegaly.     Vascular: No carotid bruit or JVD.  Cardiovascular:     Rate and Rhythm: Normal rate and regular rhythm.     Heart sounds: Normal heart sounds.  Pulmonary:     Effort: Pulmonary effort is normal.     Breath sounds: Normal breath sounds.  Abdominal:     General: Bowel sounds are normal.     Palpations:  Abdomen is soft. There is no hepatomegaly or splenomegaly.  Lymphadenopathy:     Cervical: No cervical adenopathy.  Skin:    General: Skin is warm and dry.  Neurological:     Mental Status: She is alert and oriented to person, place, and time.  Psychiatric:        Attention and Perception: Attention normal.        Mood and Affect: Mood normal.        Behavior: Behavior normal. Behavior is cooperative.        Thought Content: Thought content normal.        Judgment: Judgment normal.     Results for orders placed or performed in visit on 09/04/17  Lipid panel  Result Value Ref Range   Cholesterol, Total 200 (H) 100 - 199 mg/dL    Triglycerides 91 0 - 149 mg/dL   HDL 59 >39 mg/dL   VLDL Cholesterol Cal 18 5 - 40 mg/dL   LDL Calculated 123 (H) 0 - 99 mg/dL   Chol/HDL Ratio 3.4 0.0 - 4.4 ratio  Glucose  Result Value Ref Range   Glucose 91 65 - 99 mg/dL      Assessment & Plan:   Problem List Items Addressed This Visit      Cardiovascular and Mediastinum   Benign hypertension - Primary    Chronic, with report of increased readings at home.  Has upcoming surgery and underlying white coat syndrome, possibly anxiety related.  Due to increase readings at home and in office today, repeat improved but still elevated, will increase Lisinopril to 30 MG daily and have patient return in 6 weeks for recheck and BMP.      Relevant Medications   lisinopril (PRINIVIL,ZESTRIL) 30 MG tablet     Other   Obesity (BMI 30.0-34.9)    Current BMI 32.20.  Continue to focus on diet and exercise at home.          Follow up plan: Return in about 6 weeks (around 04/30/2018) for HTN with labs.

## 2018-03-20 ENCOUNTER — Other Ambulatory Visit: Payer: Self-pay | Admitting: Podiatry

## 2018-03-25 ENCOUNTER — Encounter
Admission: RE | Disposition: A | Discharge status: Home / Self Care | Payer: Self-pay | Source: Home / Self Care | Attending: Podiatry

## 2018-03-25 ENCOUNTER — Ambulatory Visit
Admission: RE | Admit: 2018-03-25 | Discharge: 2018-03-25 | Disposition: A | Discharge status: Home / Self Care | Payer: Managed Care, Other (non HMO) | Attending: Podiatry | Admitting: Podiatry

## 2018-03-25 ENCOUNTER — Ambulatory Visit: Payer: Managed Care, Other (non HMO) | Admitting: Anesthesiology

## 2018-03-25 DIAGNOSIS — Z79899 Other long term (current) drug therapy: Secondary | ICD-10-CM | POA: Diagnosis not present

## 2018-03-25 DIAGNOSIS — M2012 Hallux valgus (acquired), left foot: Secondary | ICD-10-CM | POA: Insufficient documentation

## 2018-03-25 DIAGNOSIS — I10 Essential (primary) hypertension: Secondary | ICD-10-CM | POA: Diagnosis not present

## 2018-03-25 HISTORY — PX: BUNIONECTOMY: SHX129

## 2018-03-25 SURGERY — BUNIONECTOMY
Anesthesia: General | Site: Foot | Laterality: Left

## 2018-03-25 MED ORDER — EPHEDRINE SULFATE 50 MG/ML IJ SOLN
INTRAMUSCULAR | Status: DC | PRN
  Administered 2018-03-25: 10 mg via INTRAVENOUS
  Administered 2018-03-25: 5 mg via INTRAVENOUS
  Administered 2018-03-25: 10 mg via INTRAVENOUS

## 2018-03-25 MED ORDER — LACTATED RINGERS IV SOLN
INTRAVENOUS | Status: DC
  Administered 2018-03-25: 14:00:00 via INTRAVENOUS

## 2018-03-25 MED ORDER — DEXAMETHASONE SODIUM PHOSPHATE 4 MG/ML IJ SOLN
INTRAMUSCULAR | Status: DC | PRN
  Administered 2018-03-25: 4 mg via INTRAVENOUS

## 2018-03-25 MED ORDER — PROPOFOL 10 MG/ML IV BOLUS
INTRAVENOUS | Status: DC | PRN
  Administered 2018-03-25: 200 mg via INTRAVENOUS

## 2018-03-25 MED ORDER — BUPIVACAINE LIPOSOME 1.3 % IJ SUSP
INTRAMUSCULAR | Status: DC | PRN
  Administered 2018-03-25: 9 mL
  Administered 2018-03-25: 10 mL

## 2018-03-25 MED ORDER — OXYCODONE-ACETAMINOPHEN 5-325 MG PO TABS: 1.0000 | ORAL_TABLET | Freq: Four times a day (QID) | ORAL | 0 refills | Status: DC | PRN

## 2018-03-25 MED ORDER — MIDAZOLAM HCL 5 MG/5ML IJ SOLN
INTRAMUSCULAR | Status: DC | PRN
  Administered 2018-03-25: 2 mg via INTRAVENOUS

## 2018-03-25 MED ORDER — ONDANSETRON HCL 4 MG/2ML IJ SOLN
INTRAMUSCULAR | Status: DC | PRN
  Administered 2018-03-25: 4 mg via INTRAVENOUS

## 2018-03-25 MED ORDER — BUPIVACAINE HCL (PF) 0.25 % IJ SOLN
INTRAMUSCULAR | Status: DC | PRN
  Administered 2018-03-25: 10 mL

## 2018-03-25 MED ORDER — GLYCOPYRROLATE 0.2 MG/ML IJ SOLN
INTRAMUSCULAR | Status: DC | PRN
  Administered 2018-03-25: 0.1 mg via INTRAVENOUS

## 2018-03-25 MED ORDER — CEFAZOLIN SODIUM-DEXTROSE 2-4 GM/100ML-% IV SOLN: 2.0000 g | INTRAVENOUS | Status: DC

## 2018-03-25 SURGICAL SUPPLY — 35 items
BANDAGE ELASTIC 4 VELCRO NS (GAUZE/BANDAGES/DRESSINGS) ×2 IMPLANT
BENZOIN TINCTURE PRP APPL 2/3 (GAUZE/BANDAGES/DRESSINGS) ×2 IMPLANT
BLADE SAW LAPIPLASTY 40X11 (INSTRUMENTS) ×1 IMPLANT
BLADE SURG 15 STRL LF DISP TIS (BLADE) IMPLANT
BLADE SURG 15 STRL SS (BLADE) ×1
BNDG COHESIVE 4X5 TAN STRL (GAUZE/BANDAGES/DRESSINGS) ×2 IMPLANT
BNDG ESMARK 4X12 TAN STRL LF (GAUZE/BANDAGES/DRESSINGS) ×2 IMPLANT
BNDG GAUZE 4.5X4.1 6PLY STRL (MISCELLANEOUS) ×2 IMPLANT
BNDG STRETCH 4X75 STRL LF (GAUZE/BANDAGES/DRESSINGS) ×2 IMPLANT
CANISTER SUCT 1200ML W/VALVE (MISCELLANEOUS) ×2 IMPLANT
CONTROL 360 (Bone Implant) ×1 IMPLANT
COVER LIGHT HANDLE UNIVERSAL (MISCELLANEOUS) ×4 IMPLANT
CUFF TOURN SGL QUICK 18 (TOURNIQUET CUFF) ×1 IMPLANT
DRAPE FLUOR MINI C-ARM 54X84 (DRAPES) ×2 IMPLANT
DURAPREP 26ML APPLICATOR (WOUND CARE) ×2 IMPLANT
ELECT REM PT RETURN 9FT ADLT (ELECTROSURGICAL) ×2
ELECTRODE REM PT RTRN 9FT ADLT (ELECTROSURGICAL) ×1 IMPLANT
GAUZE PETRO XEROFOAM 1X8 (MISCELLANEOUS) ×2 IMPLANT
GAUZE SPONGE 4X4 12PLY STRL (GAUZE/BANDAGES/DRESSINGS) ×2 IMPLANT
GLOVE BIO SURGEON STRL SZ7.5 (GLOVE) ×3 IMPLANT
GLOVE INDICATOR 8.0 STRL GRN (GLOVE) ×3 IMPLANT
GOWN STRL REUS W/ TWL LRG LVL3 (GOWN DISPOSABLE) ×2 IMPLANT
GOWN STRL REUS W/TWL LRG LVL3 (GOWN DISPOSABLE) ×2
KIT TURNOVER KIT A (KITS) ×2 IMPLANT
NS IRRIG 500ML POUR BTL (IV SOLUTION) ×2 IMPLANT
PACK EXTREMITY ARMC (MISCELLANEOUS) ×2 IMPLANT
PENCIL SMOKE EVACUATOR (MISCELLANEOUS) ×2 IMPLANT
RASP SM TEAR CROSS CUT (RASP) ×1 IMPLANT
STOCKINETTE IMPERVIOUS LG (DRAPES) ×2 IMPLANT
STRIP CLOSURE SKIN 1/4X4 (GAUZE/BANDAGES/DRESSINGS) ×2 IMPLANT
SUT MNCRL 5-0+ PC-1 (SUTURE) IMPLANT
SUT MONOCRYL 5-0 (SUTURE) ×1
SUT VIC AB 3-0 SH 27 (SUTURE) ×2
SUT VIC AB 3-0 SH 27X BRD (SUTURE) IMPLANT
SUT VIC AB 4-0 FS2 27 (SUTURE) ×1 IMPLANT

## 2018-03-25 NOTE — Anesthesia Postprocedure Evaluation (Signed)
Anesthesia Post Note  Patient: Shannon Marsh  Procedure(s) Performed: LAPIDUS - TYPE LEFT (Left Foot)  Patient location during evaluation: PACU Anesthesia Type: General Level of consciousness: awake and alert, oriented and patient cooperative Pain management: pain level controlled Vital Signs Assessment: post-procedure vital signs reviewed and stable Respiratory status: spontaneous breathing, nonlabored ventilation and respiratory function stable Cardiovascular status: blood pressure returned to baseline and stable Postop Assessment: adequate PO intake Anesthetic complications: no    Darrin Nipper

## 2018-03-25 NOTE — Op Note (Signed)
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  A total of 18 cc was used preoperatively.  An extra 9 cc of Exparel was used at the end the case.    Hemostasis: Calf tourniquet inflated to 200 mmHg for approximately 95 minutes    Specimen: None    Complications: None    Operative indications:Shannon Marsh is an 53 y.o. that presents today for surgical intervention.  The risks/benefits/alternatives/complications have been discussed and consent has been given.    Procedure:  Patient was brought into the OR and placed on the operating table in thesupine position. After anesthesia was obtained theleft lower extremity was prepped and draped in usual sterile fashion.  Attention was directed to the dorsal aspect of the first met cuneiform joint where a longitudinal incision was started at the first met cuneiforms joint and ended at approximately the level of the metatarsophalangeal joint.  This was just medial to the extensor tendon.  Sharp and blunt dissection carried down to the capsule.  Sub-periosteal dissection was then undertaken exposing the first met cuneiform joint.  The joint was freed.  Using the lapiplasty technique normal lapiplasty was performed.  The joint was initially rotated and found to be fairly tight.  At this point the distal intermetatarsal release was performed.  The conjoined tendon of the abductor was released as well as the deep transverse intermetatarsal ligament.  Better flexibility was noted.  The first metatarsal was then clamped into a straighter position using the C-clamp from the lapiplasty set.  Next the joint seeker and cut guides were placed  after stabilizing with K wires.  A dorsal plantar cut was placed on the base of the metatarsal and distal medial cuneiform.  The bone was then removed.  The joint was then prepped using a 2.0 mm drill bit on the base of the metatarsal and distal cuneiform causing good bone graft within the joint site.  This was then compressed with a compressor and good realignment of the intermetatarsal was noted down to approximately 0 degrees to 1 degree of angle.  A olive wire was used to compress the joint and then the medial and dorsal plates were then placed using normal technique.  Good alignment was noted in all planes.  The wound was flushed with copious amounts irrigation.  The metatarsophalangeal joint capsule was then opened with a T capsulotomy.  The dorsal medial eminence was transected.  Good realignment was noted.  A small capsulorrhaphy was performed with good realignment of the great toe.  All wounds were flushed with copious amounts of irrigation and closure of the capsule and subperiosteal region was performed with a 3-0 Vicryl subcutaneous tissue was closed with a 4-0 Vicryl and skin closed with a 5-0 nylon.  All amount of Exparel was used at the end.  Patient was placed in a well compressive sterile dressing.  A equalizer walker boot was then applied.  She is to remain nonweightbearing postoperatively.  A prescription for Percocet was given.    Patient tolerated the procedure and anesthesia well.  Was transported from the OR to the PACU with all vital signs stable and vascular status intact. To be  discharged per routine protocol.  Will follow up in approximately 1 week in the outpatient clinic.

## 2018-03-25 NOTE — Discharge Instructions (Signed)
[facility] °[facility] ° °POST OPERATIVE INSTRUCTIONS FOR DR. TROXLER AND DR. FOWLER °[facility] ° ° °1. Take your medication as prescribed.  Pain medication should be taken only as needed. ° °2. Keep the dressing clean, dry and intact. ° °3. Keep your foot elevated above the heart level for the first 48 hours. ° °4. Walking to the bathroom and brief periods of walking are acceptable, unless we have instructed you to be non-weight bearing. ° °5. Always wear your post-op shoe when walking.  Always use your crutches if you are to be non-weight bearing. ° °6. Do not take a shower. Baths are permissible as long as the foot is kept out of the water.  ° °7. Every hour you are awake:  °- Bend your knee 15 times. °- Flex foot 15 times °- Massage calf 15 times ° °8. Call [facility] ([phone]) if any of the following problems occur: °- You develop a temperature or fever. °- The bandage becomes saturated with blood. °- Medication does not stop your pain. °- Injury of the foot occurs. °- Any symptoms of infection including redness, odor, or red streaks running from wound. ° ° °General Anesthesia, Adult, Care After °This sheet gives you information about how to care for yourself after your procedure. Your health care provider may also give you more specific instructions. If you have problems or questions, contact your health care provider. °What can I expect after the procedure? °After the procedure, the following side effects are common: °· Pain or discomfort at the IV site. °· Nausea. °· Vomiting. °· Sore throat. °· Trouble concentrating. °· Feeling cold or chills. °· Weak or tired. °· Sleepiness and fatigue. °· Soreness and body aches. These side effects can affect parts of the body that were not involved in surgery. °Follow these instructions at home: ° °For at least 24 hours after the procedure: °· Have a responsible adult stay with you. It is important to  have someone help care for you until you are awake and alert. °· Rest as needed. °· Do not: °? Participate in activities in which you could fall or become injured. °? Drive. °? Use heavy machinery. °? Drink alcohol. °? Take sleeping pills or medicines that cause drowsiness. °? Make important decisions or sign legal documents. °? Take care of children on your own. °Eating and drinking °· Follow any instructions from your health care provider about eating or drinking restrictions. °· When you feel hungry, start by eating small amounts of foods that are soft and easy to digest (bland), such as toast. Gradually return to your regular diet. °· Drink enough fluid to keep your urine pale yellow. °· If you vomit, rehydrate by drinking water, juice, or clear broth. °General instructions °· If you have sleep apnea, surgery and certain medicines can increase your risk for breathing problems. Follow instructions from your health care provider about wearing your sleep device: °? Anytime you are sleeping, including during daytime naps. °? While taking prescription pain medicines, sleeping medicines, or medicines that make you drowsy. °· Return to your normal activities as told by your health care provider. Ask your health care provider what activities are safe for you. °· Take over-the-counter and prescription medicines only as told by your health care provider. °· If you smoke, do not smoke without supervision. °· Keep all follow-up visits as told by your health care provider. This is important. °Contact a health care provider if: °· You have   nausea or vomiting that does not get better with medicine.  You cannot eat or drink without vomiting.  You have pain that does not get better with medicine.  You are unable to pass urine.  You develop a skin rash.  You have a fever.  You have redness around your IV site that gets worse. Get help right away if:  You have difficulty breathing.  You have chest pain.  You  have blood in your urine or stool, or you vomit blood. Summary  After the procedure, it is common to have a sore throat or nausea. It is also common to feel tired.  Have a responsible adult stay with you for the first 24 hours after general anesthesia. It is important to have someone help care for you until you are awake and alert.  When you feel hungry, start by eating small amounts of foods that are soft and easy to digest (bland), such as toast. Gradually return to your regular diet.  Drink enough fluid to keep your urine pale yellow.  Return to your normal activities as told by your health care provider. Ask your health care provider what activities are safe for you. This information is not intended to replace advice given to you by your health care provider. Make sure you discuss any questions you have with your health care provider. Document Released: 06/03/2000 Document Revised: 10/11/2016 Document Reviewed: 10/11/2016 Elsevier Interactive Patient Education  2019 [employer] for Discharge Teaching: EXPAREL (bupivacaine liposome injectable suspension)   Your surgeon or anesthesiologist gave you EXPAREL(bupivacaine) to help control your pain after surgery.   EXPAREL is a local anesthetic that provides pain relief by numbing the tissue around the surgical site.  EXPAREL is designed to release pain medication over time and can control pain for up to 72 hours.  Depending on how you respond to EXPAREL, you may require less pain medication during your recovery.  Possible side effects:  Temporary loss of sensation or ability to move in the area where bupivacaine was injected.  Nausea, vomiting, constipation  Rarely, numbness and tingling in your mouth or lips, lightheadedness, or anxiety may occur.  Call your doctor right away if you think you may be experiencing any of these sensations, or if you have other questions regarding possible side effects.  Follow all  other discharge instructions given to you by your surgeon or nurse. Eat a healthy diet and drink plenty of water or other fluids.  If you return to the hospital for any reason within 96 hours following the administration of EXPAREL, it is important for health care providers to know that you have received this anesthetic. A teal colored band has been placed on your arm with the date, time and amount of EXPAREL you have received in order to alert and inform your health care providers. Please leave this armband in place for the full 96 hours following administration, and then you may remove the band.

## 2018-03-25 NOTE — Transfer of Care (Signed)
Immediate Anesthesia Transfer of Care Note  Patient: Shannon Marsh  Procedure(s) Performed: LAPIDUS - TYPE LEFT (Left Foot)  Patient Location: PACU  Anesthesia Type: General  Level of Consciousness: awake, alert  and patient cooperative  Airway and Oxygen Therapy: Patient Spontanous Breathing and Patient connected to supplemental oxygen  Post-op Assessment: Post-op Vital signs reviewed, Patient's Cardiovascular Status Stable, Respiratory Function Stable, Patent Airway and No signs of Nausea or vomiting  Post-op Vital Signs: Reviewed and stable  Complications: No apparent anesthesia complications

## 2018-03-25 NOTE — Anesthesia Procedure Notes (Signed)
Procedure Name: LMA Insertion Date/Time: 03/25/2018 2:09 PM Performed by: Cameron Ali, CRNA Pre-anesthesia Checklist: Patient identified, Emergency Drugs available, Suction available, Timeout performed and Patient being monitored Patient Re-evaluated:Patient Re-evaluated prior to induction Oxygen Delivery Method: Circle system utilized Preoxygenation: Pre-oxygenation with 100% oxygen Induction Type: IV induction LMA: LMA inserted LMA Size: 4.0 Number of attempts: 1 Placement Confirmation: positive ETCO2 and breath sounds checked- equal and bilateral Tube secured with: Tape Dental Injury: Teeth and Oropharynx as per pre-operative assessment

## 2018-03-25 NOTE — H&P (Signed)
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  The various methods of treatment have been discussed with the patient.  No guarantees were given.  After consideration of risks, benefits and other options for treatment, the patient has consented to surgery.  I have reviewed the patients' chart and labs.     A history and physical examination was performed in my office.  The patient was reexamined.  There have been no changes to this history and physical examination.  Samara Deist A

## 2018-03-26 ENCOUNTER — Encounter: Payer: Self-pay | Admitting: Podiatry

## 2018-04-01 ENCOUNTER — Encounter: Payer: Managed Care, Other (non HMO) | Admitting: Gynecology

## 2018-04-30 ENCOUNTER — Ambulatory Visit (INDEPENDENT_AMBULATORY_CARE_PROVIDER_SITE_OTHER): Payer: Managed Care, Other (non HMO) | Admitting: Nurse Practitioner

## 2018-04-30 ENCOUNTER — Other Ambulatory Visit: Payer: Self-pay

## 2018-04-30 ENCOUNTER — Encounter: Payer: Self-pay | Admitting: Nurse Practitioner

## 2018-04-30 VITALS — BP 136/80 | HR 83 | Temp 97.9°F | Ht 64.0 in

## 2018-04-30 DIAGNOSIS — I10 Essential (primary) hypertension: Secondary | ICD-10-CM

## 2018-04-30 MED ORDER — LISINOPRIL 40 MG PO TABS: 40.0000 mg | ORAL_TABLET | Freq: Every day | ORAL | 3 refills | Status: DC

## 2018-04-30 NOTE — Progress Notes (Signed)
BP 136/80 (BP Location: Left Arm, Patient Position: Sitting)   Pulse 83   Temp 97.9 F (36.6 C) (Oral)   Ht 5\' 4"  (1.626 m)   LMP 02/18/2012   SpO2 95%   BMI 32.20 kg/m    Subjective:    Patient ID: Shannon Marsh, female    DOB: 04/03/1965, 53 y.o.   MRN: [id number]  HPI: Shannon Marsh is a 53 y.o. female  Chief Complaint  Patient presents with  . Hypertension   HYPERTENSION Hypertension status: stable  Satisfied with current treatment? yes Duration of hypertension: chronic BP monitoring frequency:  daily BP range: 120-130/70 with recent readings slightly higher since walking in boot 150/80-100 range BP medication side effects:  no Medication compliance: good compliance Aspirin: no Recurrent headaches: no Visual changes: no Palpitations: no Dyspnea: no Chest pain: no Lower extremity edema: no Dizzy/lightheaded: no  Relevant past medical, surgical, family and social history reviewed and updated as indicated. Interim medical history since our last visit reviewed. Allergies and medications reviewed and updated.  Review of Systems  Constitutional: Negative for activity change, appetite change, diaphoresis, fatigue and fever.  Respiratory: Negative for cough, chest tightness and shortness of breath.   Cardiovascular: Negative for chest pain, palpitations and leg swelling.  Gastrointestinal: Negative for abdominal distention, abdominal pain, constipation, diarrhea, nausea and vomiting.  Endocrine: Negative for cold intolerance, heat intolerance, polydipsia, polyphagia and polyuria.  Neurological: Negative for dizziness, syncope, weakness, light-headedness, numbness and headaches.  Psychiatric/Behavioral: Negative.     Per HPI unless specifically indicated above     Objective:    BP 136/80 (BP Location: Left Arm, Patient Position: Sitting)   Pulse 83   Temp 97.9 F (36.6 C) (Oral)   Ht 5\' 4"  (1.626 m)   LMP 02/18/2012   SpO2 95%   BMI 32.20 kg/m    Wt Readings from Last 3 Encounters:  03/18/18 183 lb (83 kg)  03/19/18 187 lb 9.6 oz (85.1 kg)  01/28/18 182 lb (82.6 kg)    Physical Exam Vitals signs and nursing note reviewed.  Constitutional:      Appearance: She is well-developed.  HENT:     Head: Normocephalic.  Eyes:     General:        Right eye: No discharge.        Left eye: No discharge.     Conjunctiva/sclera: Conjunctivae normal.     Pupils: Pupils are equal, round, and reactive to light.  Neck:     Musculoskeletal: Normal range of motion and neck supple.     Thyroid: No thyromegaly.     Vascular: No carotid bruit or JVD.  Cardiovascular:     Rate and Rhythm: Normal rate and regular rhythm.     Heart sounds: Normal heart sounds.  Pulmonary:     Effort: Pulmonary effort is normal.     Breath sounds: Normal breath sounds.  Abdominal:     General: Bowel sounds are normal.     Palpations: Abdomen is soft.  Musculoskeletal:     Right lower leg: No edema.     Left lower leg: No edema.  Lymphadenopathy:     Cervical: No cervical adenopathy.  Skin:    General: Skin is warm and dry.  Neurological:     Mental Status: She is alert and oriented to person, place, and time.  Psychiatric:        Mood and Affect: Mood normal.        Behavior: Behavior normal.  Thought Content: Thought content normal.        Judgment: Judgment normal.     Results for orders placed or performed in visit on 09/04/17  Lipid panel  Result Value Ref Range   Cholesterol, Total 200 (H) 100 - 199 mg/dL   Triglycerides 91 0 - 149 mg/dL   HDL 59 >39 mg/dL   VLDL Cholesterol Cal 18 5 - 40 mg/dL   LDL Calculated 123 (H) 0 - 99 mg/dL   Chol/HDL Ratio 3.4 0.0 - 4.4 ratio  Glucose  Result Value Ref Range   Glucose 91 65 - 99 mg/dL      Assessment & Plan:   Problem List Items Addressed This Visit      Cardiovascular and Mediastinum   Benign hypertension - Primary    Chronic, ongoing.  Continues to have some elevations above goal  on BP readings at home and initial BP today elevated, with repeat improved.  Will increase Lisinopril to 40 MG and check BMP today.  Return in 4 weeks for follow-up.      Relevant Medications   lisinopril (PRINIVIL,ZESTRIL) 40 MG tablet   Other Relevant Orders   Basic Metabolic Panel (BMET)       Follow up plan: Return in about 4 weeks (around 05/28/2018) for HTN.

## 2018-04-30 NOTE — Assessment & Plan Note (Signed)
Chronic, ongoing.  Continues to have some elevations above goal on BP readings at home and initial BP today elevated, with repeat improved.  Will increase Lisinopril to 40 MG and check BMP today.  Return in 4 weeks for follow-up.

## 2018-04-30 NOTE — Patient Instructions (Signed)
DASH Eating Plan  DASH stands for "Dietary Approaches to Stop Hypertension." The DASH eating plan is a healthy eating plan that has been shown to reduce high blood pressure (hypertension). It may also reduce your risk for type 2 diabetes, heart disease, and stroke. The DASH eating plan may also help with weight loss.  What are tips for following this plan?    General guidelines   Avoid eating more than 2,300 mg (milligrams) of salt (sodium) a day. If you have hypertension, you may need to reduce your sodium intake to 1,500 mg a day.   Limit alcohol intake to no more than 1 drink a day for nonpregnant women and 2 drinks a day for men. One drink equals 12 oz of beer, 5 oz of wine, or 1 oz of hard liquor.   Work with your health care provider to maintain a healthy body weight or to lose weight. Ask what an ideal weight is for you.   Get at least 30 minutes of exercise that causes your heart to beat faster (aerobic exercise) most days of the week. Activities may include walking, swimming, or biking.   Work with your health care provider or diet and nutrition specialist (dietitian) to adjust your eating plan to your individual calorie needs.  Reading food labels     Check food labels for the amount of sodium per serving. Choose foods with less than 5 percent of the Daily Value of sodium. Generally, foods with less than 300 mg of sodium per serving fit into this eating plan.   To find whole grains, look for the word "whole" as the first word in the ingredient list.  Shopping   Buy products labeled as "low-sodium" or "no salt added."   Buy fresh foods. Avoid canned foods and premade or frozen meals.  Cooking   Avoid adding salt when cooking. Use salt-free seasonings or herbs instead of table salt or sea salt. Check with your health care provider or pharmacist before using salt substitutes.   Do not fry foods. Cook foods using healthy methods such as baking, boiling, grilling, and broiling instead.   Cook with  heart-healthy oils, such as olive, canola, soybean, or sunflower oil.  Meal planning   Eat a balanced diet that includes:  ? 5 or more servings of fruits and vegetables each day. At each meal, try to fill half of your plate with fruits and vegetables.  ? Up to 6-8 servings of whole grains each day.  ? Less than 6 oz of lean meat, poultry, or fish each day. A 3-oz serving of meat is about the same size as a deck of cards. One egg equals 1 oz.  ? 2 servings of low-fat dairy each day.  ? A serving of nuts, seeds, or beans 5 times each week.  ? Heart-healthy fats. Healthy fats called Omega-3 fatty acids are found in foods such as flaxseeds and coldwater fish, like sardines, salmon, and mackerel.   Limit how much you eat of the following:  ? Canned or prepackaged foods.  ? Food that is high in trans fat, such as fried foods.  ? Food that is high in saturated fat, such as fatty meat.  ? Sweets, desserts, sugary drinks, and other foods with added sugar.  ? Full-fat dairy products.   Do not salt foods before eating.   Try to eat at least 2 vegetarian meals each week.   Eat more home-cooked food and less restaurant, buffet, and fast food.     When eating at a restaurant, ask that your food be prepared with less salt or no salt, if possible.  What foods are recommended?  The items listed may not be a complete list. Talk with your dietitian about what dietary choices are best for you.  Grains  Whole-grain or whole-wheat bread. Whole-grain or whole-wheat pasta. Brown rice. Oatmeal. Quinoa. Bulgur. Whole-grain and low-sodium cereals. Pita bread. Low-fat, low-sodium crackers. Whole-wheat flour tortillas.  Vegetables  Fresh or frozen vegetables (raw, steamed, roasted, or grilled). Low-sodium or reduced-sodium tomato and vegetable juice. Low-sodium or reduced-sodium tomato sauce and tomato paste. Low-sodium or reduced-sodium canned vegetables.  Fruits  All fresh, dried, or frozen fruit. Canned fruit in natural juice (without  added sugar).  Meat and other protein foods  Skinless chicken or turkey. Ground chicken or turkey. Pork with fat trimmed off. Fish and seafood. Egg whites. Dried beans, peas, or lentils. Unsalted nuts, nut butters, and seeds. Unsalted canned beans. Lean cuts of beef with fat trimmed off. Low-sodium, lean deli meat.  Dairy  Low-fat (1%) or fat-free (skim) milk. Fat-free, low-fat, or reduced-fat cheeses. Nonfat, low-sodium ricotta or cottage cheese. Low-fat or nonfat yogurt. Low-fat, low-sodium cheese.  Fats and oils  Soft margarine without trans fats. Vegetable oil. Low-fat, reduced-fat, or light mayonnaise and salad dressings (reduced-sodium). Canola, safflower, olive, soybean, and sunflower oils. Avocado.  Seasoning and other foods  Herbs. Spices. Seasoning mixes without salt. Unsalted popcorn and pretzels. Fat-free sweets.  What foods are not recommended?  The items listed may not be a complete list. Talk with your dietitian about what dietary choices are best for you.  Grains  Baked goods made with fat, such as croissants, muffins, or some breads. Dry pasta or rice meal packs.  Vegetables  Creamed or fried vegetables. Vegetables in a cheese sauce. Regular canned vegetables (not low-sodium or reduced-sodium). Regular canned tomato sauce and paste (not low-sodium or reduced-sodium). Regular tomato and vegetable juice (not low-sodium or reduced-sodium). Pickles. Olives.  Fruits  Canned fruit in a light or heavy syrup. Fried fruit. Fruit in cream or butter sauce.  Meat and other protein foods  Fatty cuts of meat. Ribs. Fried meat. Bacon. Sausage. Bologna and other processed lunch meats. Salami. Fatback. Hotdogs. Bratwurst. Salted nuts and seeds. Canned beans with added salt. Canned or smoked fish. Whole eggs or egg yolks. Chicken or turkey with skin.  Dairy  Whole or 2% milk, cream, and half-and-half. Whole or full-fat cream cheese. Whole-fat or sweetened yogurt. Full-fat cheese. Nondairy creamers. Whipped toppings.  Processed cheese and cheese spreads.  Fats and oils  Butter. Stick margarine. Lard. Shortening. Ghee. Bacon fat. Tropical oils, such as coconut, palm kernel, or palm oil.  Seasoning and other foods  Salted popcorn and pretzels. Onion salt, garlic salt, seasoned salt, table salt, and sea salt. Worcestershire sauce. Tartar sauce. Barbecue sauce. Teriyaki sauce. Soy sauce, including reduced-sodium. Steak sauce. Canned and packaged gravies. Fish sauce. Oyster sauce. Cocktail sauce. Horseradish that you find on the shelf. Ketchup. Mustard. Meat flavorings and tenderizers. Bouillon cubes. Hot sauce and Tabasco sauce. Premade or packaged marinades. Premade or packaged taco seasonings. Relishes. Regular salad dressings.  Where to find more information:   National Heart, Lung, and Blood Institute: www.nhlbi.nih.gov   American Heart Association: www.heart.org  Summary   The DASH eating plan is a healthy eating plan that has been shown to reduce high blood pressure (hypertension). It may also reduce your risk for type 2 diabetes, heart disease, and stroke.   With the   DASH eating plan, you should limit salt (sodium) intake to 2,300 mg a day. If you have hypertension, you may need to reduce your sodium intake to 1,500 mg a day.   When on the DASH eating plan, aim to eat more fresh fruits and vegetables, whole grains, lean proteins, low-fat dairy, and heart-healthy fats.   Work with your health care provider or diet and nutrition specialist (dietitian) to adjust your eating plan to your individual calorie needs.  This information is not intended to replace advice given to you by your health care provider. Make sure you discuss any questions you have with your health care provider.  Document Released: 02/14/2011 Document Revised: 02/19/2016 Document Reviewed: 02/19/2016  [employer] Interactive Patient Education  2019 [employer]

## 2018-05-01 LAB — BASIC METABOLIC PANEL
BUN/Creatinine Ratio: 26 — ABNORMAL HIGH (ref 9–23)
BUN: 14 mg/dL (ref 6–24)
CHLORIDE: 101 mmol/L (ref 96–106)
CO2: 23 mmol/L (ref 20–29)
Calcium: 9.4 mg/dL (ref 8.7–10.2)
Creatinine, Ser: 0.54 mg/dL — ABNORMAL LOW (ref 0.57–1.00)
GFR calc Af Amer: 125 mL/min/{1.73_m2} (ref >59)
GFR calc non Af Amer: 109 mL/min/{1.73_m2} (ref >59)
Glucose: 95 mg/dL (ref 65–99)
Potassium: 4.3 mmol/L (ref 3.5–5.2)
Sodium: 139 mmol/L (ref 134–144)

## 2018-05-27 ENCOUNTER — Encounter: Payer: Self-pay | Admitting: Nurse Practitioner

## 2018-05-27 ENCOUNTER — Other Ambulatory Visit: Payer: Self-pay

## 2018-05-27 ENCOUNTER — Ambulatory Visit (INDEPENDENT_AMBULATORY_CARE_PROVIDER_SITE_OTHER): Payer: Managed Care, Other (non HMO) | Admitting: Nurse Practitioner

## 2018-05-27 VITALS — BP 128/72 | HR 76 | Temp 98.0°F | Ht 64.0 in

## 2018-05-27 DIAGNOSIS — Z638 Other specified problems related to primary support group: Secondary | ICD-10-CM | POA: Diagnosis not present

## 2018-05-27 DIAGNOSIS — F4321 Adjustment disorder with depressed mood: Secondary | ICD-10-CM | POA: Insufficient documentation

## 2018-05-27 DIAGNOSIS — Z1239 Encounter for other screening for malignant neoplasm of breast: Secondary | ICD-10-CM

## 2018-05-27 DIAGNOSIS — I10 Essential (primary) hypertension: Secondary | ICD-10-CM | POA: Diagnosis not present

## 2018-05-27 MED ORDER — SERTRALINE HCL 25 MG PO TABS: 25.0000 mg | ORAL_TABLET | Freq: Every day | ORAL | 2 refills | Status: DC

## 2018-05-27 NOTE — Progress Notes (Signed)
BP 128/72 (BP Location: Left Arm, Patient Position: Sitting)   Pulse 76   Temp 98 F (36.7 C) (Oral)   Ht 5\' 4"  (1.626 m)   LMP 02/18/2012   SpO2 97%   BMI 32.20 kg/m    Subjective:    Patient ID: Shannon Marsh, female    DOB: 1965-05-03, 53 y.o.   MRN: [id number]  HPI: Shannon Marsh is a 53 y.o. female  Chief Complaint  Patient presents with  . Hypertension    71mf/u   HYPERTENSION Hypertension status: stable  Satisfied with current treatment? yes Duration of hypertension: months BP monitoring frequency:  daily BP range: 120/70's, yesterday 130/80 BP medication side effects:  no Medication compliance: good compliance Previous BP meds: Lisinopril Aspirin: no Recurrent headaches: no Visual changes: no Palpitations: no Dyspnea: no Chest pain: no Lower extremity edema: no Dizzy/lightheaded: no   ANXIETY/STRESS Reports increased anxiety over the past few months.  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  She reports she is often primary caregiver to her grandmother, although grandmother does go to adult daycare; at this time has not been attending adult daycare due to recent sickness and concerns for current COVID 19.  She reports feeling overwhelmed.  Discussed options available and she is interested in medication at this time.  Discussed Zoloft and educated on medication, made her aware that once transitional period of life has moved forward we may be able to taper off medication.  She would like to trial this. Duration:exacerbated Anxious mood: yes  Excessive worrying: yes Irritability: yes  Sweating: no Nausea: no Palpitations:no Hyperventilation: no Panic attacks: no Agoraphobia: no  Obscessions/compulsions: no Depressed mood: occasionally Depression screen [facility] 2/9 05/27/2018 01/28/2018 07/24/2017  Decreased Interest 0 0 0  Down, Depressed, Hopeless 0  0 0  PHQ - 2 Score 0 0 0  Altered sleeping 1 1 -  Tired, decreased energy 1 1 -  Change in appetite 2 2 -  Feeling bad or failure about yourself  1 1 -  Trouble concentrating 0 0 -  Moving slowly or fidgety/restless 0 0 -  Suicidal thoughts 0 0 -  PHQ-9 Score 5 5 -  Difficult doing work/chores Not difficult at all Not difficult at all -   Anhedonia: no Weight changes: yes Insomnia: yes hard to stay asleep  Hypersomnia: no Fatigue/loss of energy: no Feelings of worthlessness: yes Feelings of guilt: yes Impaired concentration/indecisiveness: no Suicidal ideations: no  Crying spells: yes Recent Stressors/Life Changes: yes   Relationship problems: no   Family stress: yes     Financial stress: yes    Job stress: yes    Recent death/loss: no  GAD 7 : Generalized Anxiety Score 05/27/2018 01/28/2018  Nervous, Anxious, on Edge 1 0  Control/stop worrying 3 1  Worry too much - different things 3 1  Trouble relaxing 2 1  Restless 2 0  Easily annoyed or irritable 3 0  Afraid - awful might happen 2 0  Total GAD 7 Score 16 3  Anxiety Difficulty Very difficult Not difficult at all     Relevant past medical, surgical, family and social history reviewed and updated as indicated. Interim medical history since our last visit reviewed. Allergies and medications reviewed and updated.  Review of Systems  Constitutional: Negative for activity change, appetite change, diaphoresis, fatigue and fever.  Respiratory: Negative for cough, chest  tightness and shortness of breath.   Cardiovascular: Negative for chest pain, palpitations and leg swelling.  Gastrointestinal: Negative for abdominal distention, abdominal pain, constipation, diarrhea, nausea and vomiting.  Endocrine: Negative for cold intolerance, heat intolerance, polydipsia, polyphagia and polyuria.  Neurological: Negative for dizziness, syncope, weakness, light-headedness, numbness and headaches.  Psychiatric/Behavioral: Negative for  decreased concentration, self-injury and sleep disturbance. The patient is nervous/anxious.     Per HPI unless specifically indicated above     Objective:    BP 128/72 (BP Location: Left Arm, Patient Position: Sitting)   Pulse 76   Temp 98 F (36.7 C) (Oral)   Ht 5\' 4"  (1.626 m)   LMP 02/18/2012   SpO2 97%   BMI 32.20 kg/m   Wt Readings from Last 3 Encounters:  03/18/18 183 lb (83 kg)  03/19/18 187 lb 9.6 oz (85.1 kg)  01/28/18 182 lb (82.6 kg)    Physical Exam Vitals signs and nursing note reviewed.  Constitutional:      General: She is awake.     Appearance: She is well-developed.  HENT:     Head: Normocephalic.  Eyes:     General:        Right eye: No discharge.        Left eye: No discharge.     Conjunctiva/sclera: Conjunctivae normal.     Pupils: Pupils are equal, round, and reactive to light.  Neck:     Musculoskeletal: Normal range of motion and neck supple.     Thyroid: No thyromegaly.     Vascular: No carotid bruit or JVD.  Cardiovascular:     Rate and Rhythm: Normal rate and regular rhythm.     Heart sounds: Normal heart sounds. No murmur. No gallop.   Pulmonary:     Effort: Pulmonary effort is normal.     Breath sounds: Normal breath sounds.  Abdominal:     General: Bowel sounds are normal.     Palpations: Abdomen is soft.  Musculoskeletal:     Right lower leg: No edema.     Left lower leg: No edema.  Lymphadenopathy:     Cervical: No cervical adenopathy.  Skin:    General: Skin is warm and dry.  Neurological:     Mental Status: She is alert and oriented to person, place, and time.  Psychiatric:        Attention and Perception: Attention normal.        Mood and Affect: Affect is tearful.        Speech: Speech normal.        Behavior: Behavior normal. Behavior is cooperative.        Thought Content: Thought content normal.        Judgment: Judgment normal.     Comments: Frequent tearfulness during conversation.     Results for orders  placed or performed in visit on 16/38/46  Basic Metabolic Panel (BMET)  Result Value Ref Range   Glucose 95 65 - 99 mg/dL   BUN 14 6 - 24 mg/dL   Creatinine, Ser 0.54 (L) 0.57 - 1.00 mg/dL   GFR calc non Af Amer 109 >59 mL/min/1.73   GFR calc Af Amer 125 >59 mL/min/1.73   BUN/Creatinine Ratio 26 (H) 9 - 23   Sodium 139 134 - 144 mmol/L   Potassium 4.3 3.5 - 5.2 mmol/L   Chloride 101 96 - 106 mmol/L   CO2 23 20 - 29 mmol/L   Calcium 9.4 8.7 - 10.2 mg/dL  Assessment & Plan:   Problem List Items Addressed This Visit      Cardiovascular and Mediastinum   Benign hypertension - Primary    Chronic, ongoing.  Continue current medication regimen.  BP at goal at home and on repeat in office today.          Other   Caregiver role strain    GAD score 16.  Reports increased anxiety with role.  Will trial Sertraline 25 MG to start and increase as needed based on mood.  Return in 4 weeks for follow-up.       Other Visit Diagnoses    Breast cancer screening       Relevant Orders   MM DIGITAL SCREENING BILATERAL      Time: 25 minutes, >50% spent counseling on caregiver role strain and medication  Follow up plan: Return in about 4 weeks (around 06/24/2018) for mood .

## 2018-05-27 NOTE — Assessment & Plan Note (Signed)
GAD score 16.  Reports increased anxiety with role.  Will trial Sertraline 25 MG to start and increase as needed based on mood.  Return in 4 weeks for follow-up.

## 2018-05-27 NOTE — Patient Instructions (Addendum)
Please call to this number to schedule your mammogram. [phone]   Dementia Caregiver Guide Dementia is a term used to describe a number of symptoms that affect memory and thinking. The most common symptoms include:  Memory loss.  Trouble with language and communication.  Trouble concentrating.  Poor judgment.  Problems with reasoning.  Child-like behavior and language.  Extreme anxiety.  Angry outbursts.  Wandering from home or public places. Dementia usually gets worse slowly over time. In the early stages, people with dementia can stay independent and safe with some help. In later stages, they need help with daily tasks such as dressing, grooming, and using the bathroom. How to help the person with dementia cope Dementia can be frightening and confusing. Here are some tips to help the person with dementia cope with changes caused by the disease. General tips  Keep the person on track with his or her routine.  Try to identify areas where the person may need help.  Be supportive, patient, calm, and encouraging.  Gently remind the person that adjusting to changes takes time.  Help with the tasks that the person has asked for help with.  Keep the person involved in daily tasks and decisions as much as possible.  Encourage conversation, but try not to get frustrated or harried if the person struggles to find words or does not seem to appreciate your help. Communication tips  When the person is talking or seems frustrated, make eye contact and hold the person's hand.  Ask specific questions that need yes or no answers.  Use simple words, short sentences, and a calm voice. Only give one direction at a time.  When offering choices, limit them to just 1 or 2.  Avoid correcting the person in a negative way.  If the person is struggling to find the right words, gently try to help him or her. How to recognize symptoms of stress Symptoms of stress in caregivers  include:  Feeling frustrated or angry with the person with dementia.  Denying that the person has dementia or that his or her symptoms will not improve.  Feeling hopeless and unappreciated.  Difficulty sleeping.  Difficulty concentrating.  Feeling anxious, irritable, or depressed.  Developing stress-related health problems.  Feeling like you have too little time for your own life. Follow these instructions at home:   Make sure that you and the person you are caring for: ? Get regular sleep. ? Exercise regularly. ? Eat regular, nutritious meals. ? Drink enough fluid to keep your urine clear or pale yellow. ? Take over-the-counter and prescription medicines only as told by your health care providers. ? Attend all scheduled health care appointments.  Join a support group with others who are caregivers.  Ask about respite care resources so that you can have a regular break from the stress of caregiving.  Look for signs of stress in yourself and in the person you are caring for. If you notice signs of stress, take steps to manage it.  Consider any safety risks and take steps to avoid them.  Organize medications in a pill box for each day of the week.  Create a plan to handle any legal or financial matters. Get legal or financial advice if needed.  Keep a calendar in a central location to remind the person of appointments or other activities. Tips for reducing the risk of injury  Keep floors clear of clutter. Remove rugs, magazine racks, and floor lamps.  Keep hallways well lit, especially at  night.  Put a handrail and nonslip mat in the bathtub or shower.  Put childproof locks on cabinets that contain dangerous items, such as medicines, alcohol, guns, toxic cleaning items, sharp tools or utensils, matches, and lighters.  Put the locks in places where the person cannot see or reach them easily. This will help ensure that the person does not wander out of the house and get  lost.  Be prepared for emergencies. Keep a list of emergency phone numbers and addresses in a convenient area.  Remove car keys and lock garage doors so that the person does not try to get in the car and drive.  Have the person wear a bracelet that tracks locations and identifies the person as having memory problems. This should be worn at all times for safety. Where to find support: Many individuals and organizations offer support. These include:  Support groups for people with dementia and for caregivers.  Counselors or therapists.  Home health care services.  Adult day care centers. Where to find more information Alzheimer[employer]: [employer] Contact a health care provider if:  The person's health is rapidly getting worse.  You are no longer able to care for the person.  Caring for the person is affecting your physical and emotional health.  The person threatens himself or herself, you, or anyone else. Summary  Dementia is a term used to describe a number of symptoms that affect memory and thinking.  Dementia usually gets worse slowly over time.  Take steps to reduce the person's risk of injury, and to plan for future care.  Caregivers need support, relief from caregiving, and time for their own lives. This information is not intended to replace advice given to you by your health care provider. Make sure you discuss any questions you have with your health care provider. Document Released: 01/30/2016 Document Revised: 01/30/2016 Document Reviewed: 01/30/2016 Elsevier Interactive Patient Education  2019 Elsevier Inc.   Sertraline tablets What is this medicine? SERTRALINE (SER tra leen) is used to treat depression. It may also be used to treat obsessive compulsive disorder, panic disorder, post-trauma stress, premenstrual dysphoric disorder (PMDD) or social anxiety. This medicine may be used for other purposes; ask your health care provider or pharmacist if you  have questions. COMMON BRAND NAME(S): Zoloft What should I tell my health care provider before I take this medicine? They need to know if you have any of these conditions: -bleeding disorders -bipolar disorder or a family history of bipolar disorder -glaucoma -heart disease -high blood pressure -history of irregular heartbeat -history of low levels of calcium, magnesium, or potassium in the blood -if you often drink alcohol -liver disease -receiving electroconvulsive therapy -seizures -suicidal thoughts, plans, or attempt; a previous suicide attempt by you or a family member -take medicines that treat or prevent blood clots -thyroid disease -an unusual or allergic reaction to sertraline, other medicines, foods, dyes, or preservatives -pregnant or trying to get pregnant -breast-feeding How should I use this medicine? Take this medicine by mouth with a glass of water. Follow the directions on the prescription label. You can take it with or without food. Take your medicine at regular intervals. Do not take your medicine more often than directed. Do not stop taking this medicine suddenly except upon the advice of your doctor. Stopping this medicine too quickly may cause serious side effects or your condition may worsen. A special MedGuide will be given to you by the pharmacist with each prescription and refill. Be sure to  read this information carefully each time. Talk to your pediatrician regarding the use of this medicine in children. While this drug may be prescribed for children as young as 7 years for selected conditions, precautions do apply. Overdosage: If you think you have taken too much of this medicine contact a poison control center or emergency room at once. NOTE: This medicine is only for you. Do not share this medicine with others. What if I miss a dose? If you miss a dose, take it as soon as you can. If it is almost time for your next dose, take only that dose. Do not take  double or extra doses. What may interact with this medicine? Do not take this medicine with any of the following medications: -cisapride -dofetilide -dronedarone -linezolid -MAOIs like Carbex, Eldepryl, Marplan, Nardil, and Parnate -methylene blue (injected into a vein) -pimozide -thioridazine This medicine may also interact with the following medications: -alcohol -amphetamines -aspirin and aspirin-like medicines -certain medicines for depression, anxiety, or psychotic disturbances -certain medicines for fungal infections like ketoconazole, fluconazole, posaconazole, and itraconazole -certain medicines for irregular heart beat like flecainide, quinidine, propafenone -certain medicines for migraine headaches like almotriptan, eletriptan, frovatriptan, naratriptan, rizatriptan, sumatriptan, zolmitriptan -certain medicines for sleep -certain medicines for seizures like carbamazepine, valproic acid, phenytoin -certain medicines that treat or prevent blood clots like warfarin, enoxaparin, dalteparin -cimetidine -digoxin -diuretics -fentanyl -isoniazid -lithium -NSAIDs, medicines for pain and inflammation, like ibuprofen or naproxen -other medicines that prolong the QT interval (cause an abnormal heart rhythm) -rasagiline -safinamide -supplements like St. John's wort, kava kava, valerian -tolbutamide -tramadol -tryptophan This list may not describe all possible interactions. Give your health care provider a list of all the medicines, herbs, non-prescription drugs, or dietary supplements you use. Also tell them if you smoke, drink alcohol, or use illegal drugs. Some items may interact with your medicine. What should I watch for while using this medicine? Tell your doctor if your symptoms do not get better or if they get worse. Visit your doctor or health care professional for regular checks on your progress. Because it may take several weeks to see the full effects of this medicine,  it is important to continue your treatment as prescribed by your doctor. Patients and their families should watch out for new or worsening thoughts of suicide or depression. Also watch out for sudden changes in feelings such as feeling anxious, agitated, panicky, irritable, hostile, aggressive, impulsive, severely restless, overly excited and hyperactive, or not being able to sleep. If this happens, especially at the beginning of treatment or after a change in dose, call your health care professional. Dennis Bast may get drowsy or dizzy. Do not drive, use machinery, or do anything that needs mental alertness until you know how this medicine affects you. Do not stand or sit up quickly, especially if you are an older patient. This reduces the risk of dizzy or fainting spells. Alcohol may interfere with the effect of this medicine. Avoid alcoholic drinks. Your mouth may get dry. Chewing sugarless gum or sucking hard candy, and drinking plenty of water may help. Contact your doctor if the problem does not go away or is severe. What side effects may I notice from receiving this medicine? Side effects that you should report to your doctor or health care professional as soon as possible: -allergic reactions like skin rash, itching or hives, swelling of the face, lips, or tongue -anxious -black, tarry stools -changes in vision -confusion -elevated mood, decreased need for sleep, racing thoughts, impulsive  behavior -eye pain -fast, irregular heartbeat -feeling faint or lightheaded, falls -feeling agitated, angry, or irritable -hallucination, loss of contact with reality -loss of balance or coordination -loss of memory -painful or prolonged erections -restlessness, pacing, inability to keep still -seizures -stiff muscles -suicidal thoughts or other mood changes -trouble sleeping -unusual bleeding or bruising -unusually weak or tired -vomiting Side effects that usually do not require medical attention  (report to your doctor or health care professional if they continue or are bothersome): -change in appetite or weight -change in sex drive or performance -diarrhea -increased sweating -indigestion, nausea -tremors This list may not describe all possible side effects. Call your doctor for medical advice about side effects. You may report side effects to FDA at 1-800-FDA-1088. Where should I keep my medicine? Keep out of the reach of children. Store at room temperature between 15 and 30 degrees C (59 and 86 degrees F). Throw away any unused medicine after the expiration date. NOTE: This sheet is a summary. It may not cover all possible information. If you have questions about this medicine, talk to your doctor, pharmacist, or health care provider.  2019 Elsevier/Gold Standard (2016-03-01 14:17:49)

## 2018-05-27 NOTE — Assessment & Plan Note (Signed)
Chronic, ongoing.  Continue current medication regimen.  BP at goal at home and on repeat in office today.

## 2018-05-29 ENCOUNTER — Ambulatory Visit: Payer: Managed Care, Other (non HMO) | Admitting: Nurse Practitioner

## 2018-06-15 ENCOUNTER — Telehealth: Payer: Self-pay | Admitting: Nurse Practitioner

## 2018-06-15 NOTE — Telephone Encounter (Signed)
appt scheduled

## 2018-06-15 NOTE — Telephone Encounter (Signed)
Copied from [place] [id number]. Topic: General - Other >> Jun 15, 2018  2:07 PM Ivar Drape wrote: Reason for CRM:   Patient needs an appt.  She has a knot in her armpit.  Could not reach anyone on the phone.

## 2018-06-15 NOTE — Telephone Encounter (Signed)
Please contact patient and get her scheduled.

## 2018-06-17 ENCOUNTER — Other Ambulatory Visit: Payer: Self-pay

## 2018-06-17 ENCOUNTER — Ambulatory Visit (INDEPENDENT_AMBULATORY_CARE_PROVIDER_SITE_OTHER): Payer: Managed Care, Other (non HMO) | Admitting: Nurse Practitioner

## 2018-06-17 ENCOUNTER — Encounter: Payer: Self-pay | Admitting: Nurse Practitioner

## 2018-06-17 DIAGNOSIS — L739 Follicular disorder, unspecified: Secondary | ICD-10-CM | POA: Insufficient documentation

## 2018-06-17 DIAGNOSIS — Z638 Other specified problems related to primary support group: Secondary | ICD-10-CM | POA: Diagnosis not present

## 2018-06-17 MED ORDER — DOXYCYCLINE HYCLATE 100 MG PO TABS: 100.0000 mg | ORAL_TABLET | Freq: Two times a day (BID) | ORAL | 0 refills | Status: AC

## 2018-06-17 MED ORDER — MUPIROCIN 2 % EX OINT: 1.0000 "application " | TOPICAL_OINTMENT | Freq: Two times a day (BID) | CUTANEOUS | 0 refills | Status: DC

## 2018-06-17 MED ORDER — SERTRALINE HCL 50 MG PO TABS: 50.0000 mg | ORAL_TABLET | Freq: Every day | ORAL | 3 refills | Status: DC

## 2018-06-17 NOTE — Assessment & Plan Note (Signed)
GAD score with some improvement at 13, previous 16.  Increase Sertraline to 50 MG daily.  Denies SI/HI.  Recommend attending local support group for caregivers.  Return in 4 weeks for follow-up mood.

## 2018-06-17 NOTE — Progress Notes (Signed)
BP 134/84 (BP Location: Left Arm, Patient Position: Sitting, Cuff Size: Small)   Pulse 69   Temp 98.4 F (36.9 C) (Oral)   Ht 5\' 4"  (1.626 m)   Wt 186 lb (84.4 kg)   LMP 02/18/2012   SpO2 98%   BMI 31.93 kg/m    Subjective:    Patient ID: Shannon Marsh, female    DOB: July 01, 1965, 53 y.o.   MRN: [id number]  HPI: Shannon Marsh is a 53 y.o. female  Chief Complaint  Patient presents with  . Cyst    under left arm. noticed it a couple of weeks ago. pt states she have notice a line starting from the knot out 5 days ago   KNOT UNDER LEFT ARM: Thought it was an ingrown hair and "messed with it".  Felt it rupture under skin.  First noticed knot two weeks ago, felt it rupture Friday night.  States the "line of redness" started Saturday morning.  Denies pain, inflammation, warmth, or drainage.  Reports she has not been applying any cream to area.  Denies fever.  ANXIETY Caregiver stress related.  Started on Zoloft 25 MG at last visit, reports some improvement but not 100% yet.   Mood status: better Satisfied with current treatment?: yes Symptom severity: mild  Duration of current treatment : months Side effects: no Medication compliance: good compliance Psychotherapy/counseling: none Previous psychiatric medications: Sertraline Depressed mood: yes Anxious mood: yes Anhedonia: no Significant weight loss or gain: no Insomnia: yes hard to fall asleep Fatigue: yes Feelings of worthlessness or guilt: yes Impaired concentration/indecisiveness: yes Suicidal ideations: no Hopelessness: no Crying spells: yes Depression screen [facility] 2/9 06/17/2018 05/27/2018 01/28/2018 07/24/2017  Decreased Interest 1 0 0 0  Down, Depressed, Hopeless 1 0 0 0  PHQ - 2 Score 2 0 0 0  Altered sleeping 2 1 1  -  Tired, decreased energy 2 1 1  -  Change in appetite 2 2 2  -  Feeling bad or failure about yourself  2 1 1  -  Trouble concentrating 1 0 0 -  Moving slowly or fidgety/restless 0 0 0 -   Suicidal thoughts 0 0 0 -  PHQ-9 Score 11 5 5  -  Difficult doing work/chores Somewhat difficult Not difficult at all Not difficult at all -   GAD 7 : Generalized Anxiety Score 06/17/2018 05/27/2018 01/28/2018  Nervous, Anxious, on Edge 1 1 0  Control/stop worrying 2 3 1   Worry too much - different things 2 3 1   Trouble relaxing 2 2 1   Restless 2 2 0  Easily annoyed or irritable 2 3 0  Afraid - awful might happen 2 2 0  Total GAD 7 Score 13 16 3   Anxiety Difficulty Somewhat difficult Very difficult Not difficult at all    Relevant past medical, surgical, family and social history reviewed and updated as indicated. Interim medical history since our last visit reviewed. Allergies and medications reviewed and updated.  Review of Systems  Constitutional: Negative for activity change, appetite change, diaphoresis, fatigue and fever.  Respiratory: Negative for cough, chest tightness and shortness of breath.   Cardiovascular: Negative for chest pain, palpitations and leg swelling.  Gastrointestinal: Negative for abdominal distention, abdominal pain, constipation, diarrhea, nausea and vomiting.  Endocrine: Negative for cold intolerance, heat intolerance, polydipsia, polyphagia and polyuria.  Skin: Positive for wound.  Neurological: Negative for dizziness, syncope, weakness, light-headedness, numbness and headaches.  Psychiatric/Behavioral: Positive for decreased concentration and sleep disturbance. Negative for self-injury and suicidal ideas.  The patient is nervous/anxious.     Per HPI unless specifically indicated above     Objective:    BP 134/84 (BP Location: Left Arm, Patient Position: Sitting, Cuff Size: Small)   Pulse 69   Temp 98.4 F (36.9 C) (Oral)   Ht 5\' 4"  (1.626 m)   Wt 186 lb (84.4 kg)   LMP 02/18/2012   SpO2 98%   BMI 31.93 kg/m   Wt Readings from Last 3 Encounters:  06/17/18 186 lb (84.4 kg)  03/18/18 183 lb (83 kg)  03/19/18 187 lb 9.6 oz (85.1 kg)    Physical  Exam Vitals signs and nursing note reviewed.  Constitutional:      General: She is awake.     Appearance: She is well-developed. She is obese.  HENT:     Head: Normocephalic.  Eyes:     General:        Right eye: No discharge.        Left eye: No discharge.     Conjunctiva/sclera: Conjunctivae normal.     Pupils: Pupils are equal, round, and reactive to light.  Neck:     Musculoskeletal: Normal range of motion and neck supple.     Thyroid: No thyromegaly.     Vascular: No carotid bruit.  Cardiovascular:     Rate and Rhythm: Normal rate and regular rhythm.     Heart sounds: Normal heart sounds. No murmur. No gallop.   Pulmonary:     Effort: Pulmonary effort is normal. No accessory muscle usage or respiratory distress.     Breath sounds: Normal breath sounds.  Abdominal:     General: Bowel sounds are normal.     Palpations: Abdomen is soft.  Musculoskeletal:     Right lower leg: No edema.     Left lower leg: No edema.  Lymphadenopathy:     Cervical: No cervical adenopathy.  Skin:    General: Skin is warm and dry.     Findings: Abscess present.     Comments: Small, round, firm, mobile palpable nodular area under left axilla.  Area with mild erythema (no line noted), no warmth or drainage.  Area with crusting around exterior.  No discomfort with palpation.    Neurological:     Mental Status: She is alert and oriented to person, place, and time.  Psychiatric:        Attention and Perception: Attention normal.        Mood and Affect: Mood normal.        Speech: Speech normal.        Behavior: Behavior normal. Behavior is cooperative.        Thought Content: Thought content normal.        Judgment: Judgment normal.     Comments: No tearfulness during visit today.     Results for orders placed or performed in visit on 02/77/41  Basic Metabolic Panel (BMET)  Result Value Ref Range   Glucose 95 65 - 99 mg/dL   BUN 14 6 - 24 mg/dL   Creatinine, Ser 0.54 (L) 0.57 - 1.00 mg/dL    GFR calc non Af Amer 109 >59 mL/min/1.73   GFR calc Af Amer 125 >59 mL/min/1.73   BUN/Creatinine Ratio 26 (H) 9 - 23   Sodium 139 134 - 144 mmol/L   Potassium 4.3 3.5 - 5.2 mmol/L   Chloride 101 96 - 106 mmol/L   CO2 23 20 - 29 mmol/L   Calcium 9.4 8.7 - 10.2  mg/dL      Assessment & Plan:   Problem List Items Addressed This Visit      Musculoskeletal and Integument   Folliculitis    Acute, under left axilla.  Script for Doxycycline and Mupirocin sent (no Bactrim at this time as patient on Lisinopril for HTN - possible interaction, elevation in K+ with both meds).  Recommend application of warm compresses QID and allow to self drain with no manipulation.  Monitor temperature daily.  Return to office if worsening or continued symptoms present.        Other   Caregiver role strain    GAD score with some improvement at 13, previous 16.  Increase Sertraline to 50 MG daily.  Denies SI/HI.  Recommend attending local support group for caregivers.  Return in 4 weeks for follow-up mood.          Follow up plan: Return in about 4 weeks (around 07/15/2018) for Mood.

## 2018-06-17 NOTE — Patient Instructions (Signed)

## 2018-06-17 NOTE — Assessment & Plan Note (Signed)
Acute, under left axilla.  Script for Doxycycline and Mupirocin sent (no Bactrim at this time as patient on Lisinopril for HTN - possible interaction, elevation in K+ with both meds).  Recommend application of warm compresses QID and allow to self drain with no manipulation.  Monitor temperature daily.  Return to office if worsening or continued symptoms present.

## 2018-07-01 ENCOUNTER — Ambulatory Visit: Payer: Managed Care, Other (non HMO) | Admitting: Nurse Practitioner

## 2018-07-14 ENCOUNTER — Ambulatory Visit: Payer: Managed Care, Other (non HMO) | Admitting: Nurse Practitioner

## 2018-09-18 ENCOUNTER — Other Ambulatory Visit: Payer: Self-pay | Admitting: Nurse Practitioner

## 2018-09-18 NOTE — Telephone Encounter (Signed)
Forwarding medication refill to PCP for review. 

## 2018-10-12 ENCOUNTER — Ambulatory Visit (INDEPENDENT_AMBULATORY_CARE_PROVIDER_SITE_OTHER): Payer: Managed Care, Other (non HMO) | Admitting: Nurse Practitioner

## 2018-10-12 ENCOUNTER — Encounter: Payer: Self-pay | Admitting: Nurse Practitioner

## 2018-10-12 ENCOUNTER — Other Ambulatory Visit: Payer: Self-pay

## 2018-10-12 DIAGNOSIS — M779 Enthesopathy, unspecified: Secondary | ICD-10-CM | POA: Diagnosis not present

## 2018-10-12 MED ORDER — PREDNISONE 10 MG PO TABS: ORAL_TABLET | ORAL | 0 refills | Status: DC

## 2018-10-12 NOTE — Assessment & Plan Note (Signed)
To right forearm.  Suspect tendinitis due to overuse.  Script for Prednisone sent.  Recommend use of APAP and OTC gels as needed.  Avoid Ibuprofen products due to HTN, may use a Voltaren gel.  Recommend using brace or support + resting area.  May use ice as needed.  For worsening or continued symptoms return to clinic.

## 2018-10-12 NOTE — Patient Instructions (Signed)
Tendinitis  Tendinitis is swelling (inflammation) of a tendon. A tendon is a cord of tissue that connects muscle to bone. Tendinitis can cause pain, tenderness, and swelling. What are the causes?  Using a tendon or muscle too much (overuse). This is a common cause.  Wear and tear that happens as you age.  Injury.  Some medical conditions, such as arthritis.  Some medicines. What increases the risk? You are more likely to get this condition if you do activities that involve the same movements over and over again (repetitive motions). What are the signs or symptoms?  Pain.  Tenderness.  Mild swelling.  Decreased range of motion. How is this treated? This condition is usually treated with RICE therapy. RICE stands for:  Rest.  Ice.  Compression. This means putting pressure on the affected area.  Elevation. This means raising the affected area above the level of your heart. Treatment may also include:  Medicines for swelling or pain.  Exercises or physical therapy.  A brace or splint.  Surgery. This is rarely needed. Follow these instructions at home: If you have a splint or brace:  Wear the splint or brace as told by your doctor. Remove it only as told by your doctor.  Loosen the splint or brace if your fingers or toes: ? Tingle. ? Become numb. ? Turn cold and blue.  Keep the splint or brace clean.  If the splint or brace is not waterproof: ? Do not let it get wet. ? Cover it with a watertight covering when you take a bath or shower. Managing pain, stiffness, and swelling      If told, put ice on the affected area. ? If you have a removable splint or brace, remove it as told by your doctor. ? Put ice in a plastic bag. ? Place a towel between your skin and the bag. ? Leave the ice on for 20 minutes, 2-3 times a day.  Move the fingers or toes of the affected arm or leg often, if this applies. This helps to prevent stiffness and to lessen  swelling.  If told, raise the affected area above the level of your heart while you are sitting or lying down.  If told, put heat on the affected area before you exercise. Use the heat source that your doctor recommends, such as a moist heat pack or a heating pad. ? Place a towel between your skin and the heat source. ? Leave the heat on for 20-30 minutes. ? Remove the heat if your skin turns bright red. This is very important if you are unable to feel pain, heat, or cold. You may have a greater risk of getting burned. Driving  Do not drive or use heavy machinery while taking prescription pain medicine.  Ask your doctor when it is safe to drive if you have a splint or brace on any part of your arm or leg. Activity  Rest the affected area as told by your doctor.  Return to your normal activities as told by your doctor. Ask your doctor what activities are safe for you.  Avoid using the affected area while you have symptoms.  Do exercises as told by your doctor. General instructions  If you have a splint, do not put pressure on any part of the splint until it is fully hardened. This may take several hours.  Wear an elastic bandage or pressure (compression) wrap only as told by your doctor.  Take over-the-counter and prescription medicines only as   told by your doctor.  Keep all follow-up visits as told by your doctor. This is important. Contact a doctor if:  You do not get better.  You get new problems, such as numbness in your hands, and you do not know why. Summary  Tendinitis is swelling (inflammation) of a tendon.  You are more likely to get this condition if you do activities that involve the same movements over and over again.  This condition is usually treated with RICE therapy. RICE stands for rest, ice, compression, and elevate.  Avoid using the affected area while you have symptoms. This information is not intended to replace advice given to you by your health care  provider. Make sure you discuss any questions you have with your health care provider. Document Released: 06/07/2010 Document Revised: 09/02/2017 Document Reviewed: 07/16/2017 Elsevier Patient Education  2020 [employer]  

## 2018-10-12 NOTE — Progress Notes (Signed)
BP 138/86 (BP Location: Left Arm)   Pulse 70   Temp 98.3 F (36.8 C) (Oral)   LMP 02/18/2012   SpO2 99%    Subjective:    Patient ID: Shannon Marsh, female    DOB: 06-Aug-1965, 53 y.o.   MRN: [id number]  HPI: Shannon Marsh is a 53 y.o. female  Chief Complaint  Patient presents with  . Arm Pain    R forearm, states she has a numb place there since Thursday. States she gets a pain with even small movements in that spot.    ARM PAIN Has been present since last Thursday.  Lower right arm to inner aspect with constant numbness and intermittent pain.  States it does not hurt, "until it does".  Pushing leggings down this morning caused it to hurt and flushing toilet the other day caused discomfort.  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  Has had soreness before, but not numbness.  States she "tends to overdo it".  Recently was playing video game night before numbness started and used mouse for a few hours that same day. Duration: weeks Location: right forearm Mechanism of injury: unknown Onset: gradual Severity: mild  Quality:  sharp, aching, burning and sore Frequency: numbness is constant to right forearm and pain is intermittent dependent on what she is doing Radiation: no Aggravating factors: movement  Alleviating factors: ice, APAP and rest  Status: stable Treatments attempted: rest, ice and APAP  Relief with NSAIDs?:  No NSAIDs Taken Swelling: yes Redness: no  Warmth: no Trauma: no Chest pain: no  Shortness of breath: no  Fever: no Decreased sensation: yes Paresthesias: no Weakness: no  Relevant past medical, surgical, family and social history reviewed and updated as indicated. Interim medical history since our last visit reviewed. Allergies and medications reviewed and updated.  Review of Systems  Constitutional: Negative for activity change, appetite change, diaphoresis, fatigue and fever.  Respiratory: Negative for cough, chest tightness and  shortness of breath.   Cardiovascular: Negative for chest pain, palpitations and leg swelling.  Gastrointestinal: Negative for abdominal distention, abdominal pain, constipation, diarrhea, nausea and vomiting.  Musculoskeletal: Positive for arthralgias.  Neurological: Negative for dizziness, syncope, weakness, light-headedness, numbness and headaches.  Psychiatric/Behavioral: Negative.     Per HPI unless specifically indicated above     Objective:    BP 138/86 (BP Location: Left Arm)   Pulse 70   Temp 98.3 F (36.8 C) (Oral)   LMP 02/18/2012   SpO2 99%   Wt Readings from Last 3 Encounters:  06/17/18 186 lb (84.4 kg)  03/18/18 183 lb (83 kg)  03/19/18 187 lb 9.6 oz (85.1 kg)    Physical Exam Vitals signs and nursing note reviewed.  Constitutional:      General: She is awake. She is not in acute distress.    Appearance: She is well-developed. She is not ill-appearing.  HENT:     Head: Normocephalic.     Right Ear: Hearing normal.     Left Ear: Hearing normal.     Nose: Nose normal.     Mouth/Throat:     Mouth: Mucous membranes are moist.  Eyes:     General: Lids are normal.        Right eye: No discharge.        Left eye: No discharge.     Conjunctiva/sclera: Conjunctivae normal.     Pupils: Pupils are equal, round, and reactive to light.  Neck:  Musculoskeletal: Normal range of motion and neck supple.  Cardiovascular:     Rate and Rhythm: Normal rate and regular rhythm.     Heart sounds: Normal heart sounds. No murmur. No gallop.   Pulmonary:     Effort: Pulmonary effort is normal. No accessory muscle usage or respiratory distress.     Breath sounds: Normal breath sounds.  Abdominal:     General: Bowel sounds are normal.     Palpations: Abdomen is soft.  Musculoskeletal:     Right forearm: She exhibits swelling (mild to inner aspect). She exhibits no tenderness, no bony tenderness, no edema, no deformity and no laceration.     Left forearm: She exhibits no  tenderness, no bony tenderness, no swelling, no edema and no laceration.     Right hand: She exhibits normal range of motion, no tenderness, no bony tenderness, normal capillary refill and no swelling. Normal sensation noted. Normal strength noted.     Left hand: She exhibits normal range of motion, no tenderness, no bony tenderness, normal capillary refill, no deformity and no swelling. Normal sensation noted. Normal strength noted.     Right lower leg: No edema.     Left lower leg: No edema.     Comments: Right inner forearm with mild decreased sensation, ROM WNL.  No tenderness with ROM noted.  Unable to reproduce discomfort.  Negative Phalen and Tinel.    Skin:    General: Skin is warm and dry.     Findings: No rash.  Neurological:     Mental Status: She is alert and oriented to person, place, and time.  Psychiatric:        Attention and Perception: Attention normal.        Mood and Affect: Mood normal.        Behavior: Behavior normal. Behavior is cooperative.        Thought Content: Thought content normal.        Judgment: Judgment normal.     Results for orders placed or performed in visit on 28/78/67  Basic Metabolic Panel (BMET)  Result Value Ref Range   Glucose 95 65 - 99 mg/dL   BUN 14 6 - 24 mg/dL   Creatinine, Ser 0.54 (L) 0.57 - 1.00 mg/dL   GFR calc non Af Amer 109 >59 mL/min/1.73   GFR calc Af Amer 125 >59 mL/min/1.73   BUN/Creatinine Ratio 26 (H) 9 - 23   Sodium 139 134 - 144 mmol/L   Potassium 4.3 3.5 - 5.2 mmol/L   Chloride 101 96 - 106 mmol/L   CO2 23 20 - 29 mmol/L   Calcium 9.4 8.7 - 10.2 mg/dL      Assessment & Plan:   Problem List Items Addressed This Visit      Musculoskeletal and Integument   Tendinitis    To right forearm.  Suspect tendinitis due to overuse.  Script for Prednisone sent.  Recommend use of APAP and OTC gels as needed.  Avoid Ibuprofen products due to HTN, may use a Voltaren gel.  Recommend using brace or support + resting area.  May  use ice as needed.  For worsening or continued symptoms return to clinic.          Follow up plan: Return if symptoms worsen or fail to improve.

## 2018-12-02 ENCOUNTER — Encounter: Payer: Self-pay | Admitting: Gynecology

## 2019-01-18 ENCOUNTER — Other Ambulatory Visit: Payer: Self-pay | Admitting: Nurse Practitioner

## 2019-02-01 ENCOUNTER — Other Ambulatory Visit: Payer: Self-pay

## 2019-02-01 ENCOUNTER — Encounter: Payer: Self-pay | Admitting: Nurse Practitioner

## 2019-02-01 ENCOUNTER — Ambulatory Visit (INDEPENDENT_AMBULATORY_CARE_PROVIDER_SITE_OTHER): Payer: Managed Care, Other (non HMO) | Admitting: Nurse Practitioner

## 2019-02-01 ENCOUNTER — Ambulatory Visit: Payer: Self-pay

## 2019-02-01 DIAGNOSIS — Z638 Other specified problems related to primary support group: Secondary | ICD-10-CM | POA: Diagnosis not present

## 2019-02-01 DIAGNOSIS — I10 Essential (primary) hypertension: Secondary | ICD-10-CM

## 2019-02-01 MED ORDER — AMLODIPINE BESYLATE 5 MG PO TABS: 5.0000 mg | ORAL_TABLET | Freq: Every day | ORAL | 3 refills | Status: DC

## 2019-02-01 MED ORDER — SERTRALINE HCL 100 MG PO TABS: 100.0000 mg | ORAL_TABLET | Freq: Every day | ORAL | 3 refills | Status: DC

## 2019-02-01 NOTE — Progress Notes (Signed)
BP (!) 163/104   Pulse 83   LMP 02/18/2012    Subjective:    Patient ID: Shannon Marsh, female    DOB: [birth year], 53 y.o.   MRN: [id number]  HPI: Shannon Marsh is a 53 y.o. female  Chief Complaint  Patient presents with  . Hypertension    pt states she has been getting elevated BP readings    . This visit was completed via Doximity due to the restrictions of the COVID-19 pandemic. All issues as above were discussed and addressed. Physical exam was done as above through visual confirmation on Doximity. If it was felt that the patient should be evaluated in the office, they were directed there. The patient verbally consented to this visit. . Location of the patient: home . Location of the provider: work . Those involved with this call:  . Provider: Marnee Guarneri, DNP . CMA: Yvonna Alanis, CMA . Front Desk/Registration: Jill Side  . Time spent on call: 15 minutes with patient face to face via video conference. More than 50% of this time was spent in counseling and coordination of care. 10 minutes total spent in review of patient's record and preparation of their chart.  . I verified patient identity using two factors (patient name and date of birth). Patient consents verbally to being seen via telemedicine visit today.    HYPERTENSION Currently taking Lisinopril 40 MG daily.  She reports she had not been checking it "like I should" and she noticed elevation on Sunday.  Does not recall when it was more along the normal.  Reports since last visit her nephew, who was 27, got the news of cancer and then 3 days later his mother, patient's sister, died suddenly.  Nephew died recently as well.  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  Patient has foot surgery scheduled for next week.  Hypertension status: uncontrolled  Satisfied with current treatment? yes Duration of hypertension: chronic BP monitoring frequency:  rarely  BP range: 150-160/90-100 BP medication side effects:  no Medication compliance: good compliance Previous BP meds:Lisinopril Aspirin: no Recurrent headaches: x one recently, no further Visual changes: no Palpitations: no Dyspnea: no Chest pain: no Lower extremity edema: no Dizzy/lightheaded: no   ANXIETY/STRESS Does endorse some increased stressor days on occasion, currently taking Sertraline 50 MG daily. Duration:exacerbated Anxious mood: yes  Excessive worrying: yes Irritability: no  Sweating: no Nausea: no Palpitations:no Hyperventilation: no Panic attacks: no Agoraphobia: no  Obscessions/compulsions: no Depressed mood: sometimes Depression screen [facility] 2/9 06/17/2018 05/27/2018 01/28/2018 07/24/2017  Decreased Interest 1 0 0 0  Down, Depressed, Hopeless 1 0 0 0  PHQ - 2 Score 2 0 0 0  Altered sleeping 2 1 1  -  Tired, decreased energy 2 1 1  -  Change in appetite 2 2 2  -  Feeling bad or failure about yourself  2 1 1  -  Trouble concentrating 1 0 0 -  Moving slowly or fidgety/restless 0 0 0 -  Suicidal thoughts 0 0 0 -  PHQ-9 Score 11 5 5  -  Difficult doing work/chores Somewhat difficult Not difficult at all Not difficult at all -   Anhedonia: no Weight changes: no Insomnia: none Hypersomnia: no Fatigue/loss of energy: no Feelings of worthlessness: no Feelings of guilt: no Impaired concentration/indecisiveness: yes Suicidal ideations: no  Crying spells: no Recent Stressors/Life Changes: yes   Relationship problems: no   Family stress: yes     Financial stress: no  Job stress: no    Recent death/loss: yes  Relevant past medical, surgical, family and social history reviewed and updated as indicated. Interim medical history since our last visit reviewed. Allergies and medications reviewed and updated.  Review of Systems  Constitutional: Negative for activity change, appetite change, diaphoresis, fatigue and fever.  Respiratory: Negative for cough, chest  tightness and shortness of breath.   Cardiovascular: Negative for chest pain, palpitations and leg swelling.  Gastrointestinal: Negative for abdominal distention, abdominal pain, constipation, diarrhea, nausea and vomiting.  Neurological: Negative for dizziness, syncope, weakness, light-headedness, numbness and headaches.  Psychiatric/Behavioral: Negative.     Per HPI unless specifically indicated above     Objective:    BP (!) 163/104   Pulse 83   LMP 02/18/2012   Wt Readings from Last 3 Encounters:  06/17/18 186 lb (84.4 kg)  03/18/18 183 lb (83 kg)  03/19/18 187 lb 9.6 oz (85.1 kg)    Physical Exam Vitals signs and nursing note reviewed.  Constitutional:      General: She is awake. She is not in acute distress.    Appearance: She is well-developed. She is not ill-appearing.  HENT:     Head: Normocephalic.     Right Ear: Hearing normal.     Left Ear: Hearing normal.  Eyes:     General: Lids are normal.        Right eye: No discharge.        Left eye: No discharge.     Conjunctiva/sclera: Conjunctivae normal.  Neck:     Musculoskeletal: Normal range of motion.  Pulmonary:     Effort: Pulmonary effort is normal. No accessory muscle usage or respiratory distress.  Neurological:     Mental Status: She is alert and oriented to person, place, and time.  Psychiatric:        Attention and Perception: Attention normal.        Mood and Affect: Mood normal.        Behavior: Behavior normal. Behavior is cooperative.        Thought Content: Thought content normal.        Judgment: Judgment normal.     Results for orders placed or performed in visit on [id number]  Basic Metabolic Panel (BMET)  Result Value Ref Range   Glucose 95 65 - 99 mg/dL   BUN 14 6 - 24 mg/dL   Creatinine, Ser 0.54 (L) 0.57 - 1.00 mg/dL   GFR calc non Af Amer 109 >59 mL/min/1.73   GFR calc Af Amer 125 >59 mL/min/1.73   BUN/Creatinine Ratio 26 (H) 9 - 23   Sodium 139 134 - 144 mmol/L   Potassium 4.3  3.5 - 5.2 mmol/L   Chloride 101 96 - 106 mmol/L   CO2 23 20 - 29 mmol/L   Calcium 9.4 8.7 - 10.2 mg/dL      Assessment & Plan:   Problem List Items Addressed This Visit      Cardiovascular and Mediastinum   Benign hypertension    Poor control at this time, recommend she monitor BP daily and document + focus on a low sodium (DASH) diet at home.  Continue Lisinopril 40 MG daily.  Will add on Amlodipine 5 MG daily, educated on side effects to monitor for.  Script sent.  Return in 4 weeks for BP check and labs.      Relevant Medications   amLODipine (NORVASC) 5 MG tablet     Other   Caregiver role strain  Ongoing with exacerbation from recent family losses.  Will increase Sertraline to 100 MG, has 50 MG tablets left at home and recommend she take two of those a day until complete, then pick up new prescription.  Denies SI/HI.  Recommend therapy, but she does not wish at this point.  Return in 4 weeks for f/u.         I discussed the assessment and treatment plan with the patient. The patient was provided an opportunity to ask questions and all were answered. The patient agreed with the plan and demonstrated an understanding of the instructions.   The patient was advised to call back or seek an in-person evaluation if the symptoms worsen or if the condition fails to improve as anticipated.   I provided 15 minutes of time during this encounter.  Follow up plan: Return in about 4 weeks (around 03/01/2019) for Anxiety and HTN + labs.

## 2019-02-01 NOTE — Telephone Encounter (Signed)
180/108 , 167/107,  yesterday with headache. Pt rechecked several times but the diastolic never got be [id number] 140-150/80-90.  No weakness to either side of body, or numbness. No headache today. Pt thinks need BP med adjustment. Care advice given and pt verbalized understanding. Call transferred to [place] at [facility]. Reason for Disposition . [id number] Systolic BP  >= [id number] OR Diastolic >= 80 AND [id number] taking BP medications  Answer Assessment - Initial Assessment Questions 1. BLOOD PRESSURE: "What is the blood pressure?" "Did you take at least two measurements 5 minutes apart?"     157/109 and 167/108 2. ONSET: "When did you take your blood pressure?"     This am  3. HOW: "How did you obtain the blood pressure?" (e.g., visiting nurse, automatic home BP monitor)     Automatic BP monitor 4. HISTORY: "Do you have a history of high blood pressure?"     yes 5. MEDICATIONS: "Are you taking any medications for blood pressure?" "Have you missed any doses recently?"     Yes no 6. OTHER SYMPTOMS: "Do you have any symptoms?" (e.g., headache, chest pain, blurred vision, difficulty breathing, weakness)     headache 7. PREGNANCY: "Is there any chance you are pregnant?" "When was your last menstrual period?"    n/a  Protocols used: HIGH BLOOD PRESSURE-A-AH

## 2019-02-01 NOTE — Assessment & Plan Note (Signed)
Poor control at this time, recommend she monitor BP daily and document + focus on a low sodium (DASH) diet at home.  Continue Lisinopril 40 MG daily.  Will add on Amlodipine 5 MG daily, educated on side effects to monitor for.  Script sent.  Return in 4 weeks for BP check and labs.

## 2019-02-01 NOTE — Patient Instructions (Signed)

## 2019-02-01 NOTE — Assessment & Plan Note (Signed)
Ongoing with exacerbation from recent family losses.  Will increase Sertraline to 100 MG, has 50 MG tablets left at home and recommend she take two of those a day until complete, then pick up new prescription.  Denies SI/HI.  Recommend therapy, but she does not wish at this point.  Return in 4 weeks for f/u.

## 2019-02-02 ENCOUNTER — Other Ambulatory Visit: Payer: Self-pay | Admitting: Podiatry

## 2019-02-03 ENCOUNTER — Other Ambulatory Visit: Payer: Self-pay

## 2019-02-05 ENCOUNTER — Other Ambulatory Visit: Admission: RE | Admit: 2019-02-05 | Payer: Managed Care, Other (non HMO) | Source: Ambulatory Visit

## 2019-02-08 ENCOUNTER — Other Ambulatory Visit
Admission: RE | Admit: 2019-02-08 | Discharge: 2019-02-08 | Disposition: A | Discharge status: Home / Self Care | Payer: Managed Care, Other (non HMO) | Source: Ambulatory Visit | Attending: Podiatry | Admitting: Podiatry

## 2019-02-08 DIAGNOSIS — Z20828 Contact with and (suspected) exposure to other viral communicable diseases: Secondary | ICD-10-CM | POA: Insufficient documentation

## 2019-02-08 DIAGNOSIS — Z01812 Encounter for preprocedural laboratory examination: Secondary | ICD-10-CM | POA: Diagnosis present

## 2019-02-09 LAB — SARS CORONAVIRUS 2 (TAT 6-24 HRS): SARS Coronavirus 2: NEGATIVE

## 2019-02-10 ENCOUNTER — Ambulatory Visit
Admission: RE | Admit: 2019-02-10 | Discharge: 2019-02-10 | Disposition: A | Discharge status: Home / Self Care | Payer: Managed Care, Other (non HMO) | Attending: Podiatry | Admitting: Podiatry

## 2019-02-10 ENCOUNTER — Ambulatory Visit: Payer: Managed Care, Other (non HMO) | Admitting: Anesthesiology

## 2019-02-10 ENCOUNTER — Encounter
Admission: RE | Disposition: A | Discharge status: Home / Self Care | Payer: Self-pay | Source: Home / Self Care | Attending: Podiatry

## 2019-02-10 ENCOUNTER — Other Ambulatory Visit: Payer: Self-pay

## 2019-02-10 DIAGNOSIS — Z79899 Other long term (current) drug therapy: Secondary | ICD-10-CM | POA: Insufficient documentation

## 2019-02-10 DIAGNOSIS — I10 Essential (primary) hypertension: Secondary | ICD-10-CM | POA: Diagnosis not present

## 2019-02-10 DIAGNOSIS — M2011 Hallux valgus (acquired), right foot: Secondary | ICD-10-CM | POA: Insufficient documentation

## 2019-02-10 HISTORY — DX: Asymptomatic varicose veins of unspecified lower extremity: I83.90

## 2019-02-10 HISTORY — DX: Gastro-esophageal reflux disease without esophagitis: K21.9

## 2019-02-10 HISTORY — DX: Bunion of right foot: M21.611

## 2019-02-10 HISTORY — PX: BUNIONECTOMY: SHX129

## 2019-02-10 SURGERY — BUNIONECTOMY
Anesthesia: General | Site: Toe | Laterality: Right

## 2019-02-10 MED ORDER — LACTATED RINGERS IV SOLN
INTRAVENOUS | Status: DC
  Administered 2019-02-10: 11:00:00 via INTRAVENOUS

## 2019-02-10 MED ORDER — ACETAMINOPHEN 160 MG/5ML PO SOLN: 325.0000 mg | ORAL | Status: DC | PRN

## 2019-02-10 MED ORDER — MIDAZOLAM HCL 5 MG/5ML IJ SOLN
INTRAMUSCULAR | Status: DC | PRN
  Administered 2019-02-10: 2 mg via INTRAVENOUS

## 2019-02-10 MED ORDER — OXYCODONE HCL 5 MG/5ML PO SOLN: 5.0000 mg | Freq: Once | ORAL | Status: DC | PRN

## 2019-02-10 MED ORDER — SCOPOLAMINE 1 MG/3DAYS TD PT72
1.0000 | MEDICATED_PATCH | Freq: Once | TRANSDERMAL | Status: DC
  Administered 2019-02-10: 11:00:00 1.5 mg via TRANSDERMAL

## 2019-02-10 MED ORDER — FENTANYL CITRATE (PF) 100 MCG/2ML IJ SOLN: 25.0000 ug | INTRAMUSCULAR | Status: DC | PRN

## 2019-02-10 MED ORDER — DEXAMETHASONE SODIUM PHOSPHATE 4 MG/ML IJ SOLN
INTRAMUSCULAR | Status: DC | PRN
  Administered 2019-02-10: 4 mg via INTRAVENOUS

## 2019-02-10 MED ORDER — POVIDONE-IODINE 7.5 % EX SOLN
Freq: Once | CUTANEOUS | Status: AC
  Administered 2019-02-10: 11:00:00 via TOPICAL

## 2019-02-10 MED ORDER — ACETAMINOPHEN 325 MG PO TABS: 325.0000 mg | ORAL_TABLET | ORAL | Status: DC | PRN

## 2019-02-10 MED ORDER — EPHEDRINE SULFATE 50 MG/ML IJ SOLN
INTRAMUSCULAR | Status: DC | PRN
  Administered 2019-02-10 (×2): 10 mg via INTRAVENOUS

## 2019-02-10 MED ORDER — OXYCODONE-ACETAMINOPHEN 5-325 MG PO TABS: 1.0000 | ORAL_TABLET | ORAL | 0 refills | Status: DC | PRN

## 2019-02-10 MED ORDER — BUPIVACAINE HCL (PF) 0.25 % IJ SOLN
INTRAMUSCULAR | Status: DC | PRN
  Administered 2019-02-10: 10 mL

## 2019-02-10 MED ORDER — CEFAZOLIN SODIUM-DEXTROSE 2-4 GM/100ML-% IV SOLN
2.0000 g | INTRAVENOUS | Status: AC
  Administered 2019-02-10: 12:00:00 2 g via INTRAVENOUS

## 2019-02-10 MED ORDER — ONDANSETRON HCL 4 MG/2ML IJ SOLN
INTRAMUSCULAR | Status: DC | PRN
  Administered 2019-02-10: 4 mg via INTRAVENOUS

## 2019-02-10 MED ORDER — PROPOFOL 10 MG/ML IV BOLUS
INTRAVENOUS | Status: DC | PRN
  Administered 2019-02-10: 200 mg via INTRAVENOUS

## 2019-02-10 MED ORDER — LIDOCAINE HCL (CARDIAC) PF 100 MG/5ML IV SOSY
PREFILLED_SYRINGE | INTRAVENOUS | Status: DC | PRN
  Administered 2019-02-10: 30 mg via INTRATRACHEAL

## 2019-02-10 MED ORDER — FENTANYL CITRATE (PF) 100 MCG/2ML IJ SOLN
INTRAMUSCULAR | Status: DC | PRN
  Administered 2019-02-10 (×2): 25 ug via INTRAVENOUS
  Administered 2019-02-10: 50 ug via INTRAVENOUS

## 2019-02-10 MED ORDER — OXYCODONE HCL 5 MG PO TABS: 5.0000 mg | ORAL_TABLET | Freq: Once | ORAL | Status: DC | PRN

## 2019-02-10 MED ORDER — BUPIVACAINE LIPOSOME 1.3 % IJ SUSP
INTRAMUSCULAR | Status: DC | PRN
  Administered 2019-02-10 (×2): 10 mL

## 2019-02-10 SURGICAL SUPPLY — 43 items
BANDAGE ELASTIC 4 VELCRO NS (GAUZE/BANDAGES/DRESSINGS) ×2 IMPLANT
BENZOIN TINCTURE PRP APPL 2/3 (GAUZE/BANDAGES/DRESSINGS) ×2 IMPLANT
BLADE OSC/SAGITTAL MD 9X18.5 (BLADE) ×1 IMPLANT
BLADE SAW LAPIPLASTY 40X11 (INSTRUMENTS) ×1 IMPLANT
BLADE SURG 15 STRL LF DISP TIS (BLADE) IMPLANT
BLADE SURG 15 STRL SS (BLADE) ×1
BNDG COHESIVE 4X5 TAN STRL (GAUZE/BANDAGES/DRESSINGS) ×2 IMPLANT
BNDG ELASTIC 4X5.8 VLCR STR LF (GAUZE/BANDAGES/DRESSINGS) ×1 IMPLANT
BNDG ESMARK 4X12 TAN STRL LF (GAUZE/BANDAGES/DRESSINGS) ×2 IMPLANT
BNDG GAUZE 4.5X4.1 6PLY STRL (MISCELLANEOUS) ×2 IMPLANT
BNDG STRETCH 4X75 STRL LF (GAUZE/BANDAGES/DRESSINGS) ×2 IMPLANT
CANISTER SUCT 1200ML W/VALVE (MISCELLANEOUS) ×2 IMPLANT
CONTROL 360 (Bone Implant) ×1 IMPLANT
COVER LIGHT HANDLE UNIVERSAL (MISCELLANEOUS) ×4 IMPLANT
CUFF TOURN SGL QUICK 18X4 (TOURNIQUET CUFF) ×1 IMPLANT
DRAPE FLUOR MINI C-ARM 54X84 (DRAPES) ×2 IMPLANT
DURAPREP 26ML APPLICATOR (WOUND CARE) ×2 IMPLANT
ELECT REM PT RETURN 9FT ADLT (ELECTROSURGICAL) ×2
ELECTRODE REM PT RTRN 9FT ADLT (ELECTROSURGICAL) ×1 IMPLANT
GAUZE SPONGE 4X4 12PLY STRL (GAUZE/BANDAGES/DRESSINGS) ×2 IMPLANT
GAUZE XEROFORM 1X8 LF (GAUZE/BANDAGES/DRESSINGS) ×1 IMPLANT
GLOVE BIO SURGEON STRL SZ7.5 (GLOVE) ×2 IMPLANT
GLOVE INDICATOR 8.0 STRL GRN (GLOVE) ×2 IMPLANT
GLOVE PI ULTRA LF STRL 7.5 (GLOVE) IMPLANT
GLOVE PI ULTRA NON LATEX 7.5 (GLOVE) ×1
GOWN STRL REUS W/ TWL LRG LVL3 (GOWN DISPOSABLE) ×2 IMPLANT
GOWN STRL REUS W/TWL LRG LVL3 (GOWN DISPOSABLE) ×3
K-WIRE DBL END TROCAR 6X.045 (WIRE) ×2
KIT PROCEDURE DRILL (DRILL) ×1 IMPLANT
KIT TURNOVER KIT A (KITS) ×2 IMPLANT
KWIRE DBL END TROCAR 6X.045 (WIRE) IMPLANT
NS IRRIG 500ML POUR BTL (IV SOLUTION) ×2 IMPLANT
PACK EXTREMITY ARMC (MISCELLANEOUS) ×2 IMPLANT
PENCIL SMOKE EVACUATOR (MISCELLANEOUS) ×2 IMPLANT
RASP SM TEAR CROSS CUT (RASP) ×1 IMPLANT
STAPLE DYNACLIP 8X8 (Staple) ×1 IMPLANT
STOCKINETTE IMPERVIOUS LG (DRAPES) ×2 IMPLANT
STRIP CLOSURE SKIN 1/4X4 (GAUZE/BANDAGES/DRESSINGS) ×2 IMPLANT
SUT MNCRL 5-0+ PC-1 (SUTURE) IMPLANT
SUT MONOCRYL 5-0 (SUTURE) ×1
SUT VIC AB 3-0 SH 27 (SUTURE) ×1
SUT VIC AB 3-0 SH 27X BRD (SUTURE) IMPLANT
SUT VIC AB 4-0 FS2 27 (SUTURE) ×1 IMPLANT

## 2019-02-10 NOTE — Anesthesia Preprocedure Evaluation (Signed)
Anesthesia Evaluation  Patient identified by MRN, date of birth, ID band Patient awake    Reviewed: Allergy & Precautions, NPO status   History of Anesthesia Complications Negative for: history of anesthetic complications  Airway Mallampati: III   Neck ROM: Full    Dental no notable dental hx.    Pulmonary neg pulmonary ROS,    breath sounds clear to auscultation       Cardiovascular hypertension,  Rhythm:Regular Rate:Normal     Neuro/Psych    GI/Hepatic negative GI ROS,   Endo/Other  diabetes (hx gestational DM)  Renal/GU      Musculoskeletal   Abdominal   Peds  Hematology  (+) Blood dyscrasia, anemia ,   Anesthesia Other Findings   Reproductive/Obstetrics                             Anesthesia Physical  Anesthesia Plan  ASA: II  Anesthesia Plan: General   Post-op Pain Management:    Induction: Intravenous  PONV Risk Score and Plan: 3 and Dexamethasone and Ondansetron  Airway Management Planned:   Additional Equipment:   Intra-op Plan:   Post-operative Plan:   Informed Consent: I have reviewed the patients History and Physical, chart, labs and discussed the procedure including the risks, benefits and alternatives for the proposed anesthesia with the patient or authorized representative who has indicated his/her understanding and acceptance.       Plan Discussed with: CRNA  Anesthesia Plan Comments:         Anesthesia Quick Evaluation

## 2019-02-10 NOTE — Anesthesia Procedure Notes (Signed)
Procedure Name: LMA Insertion Date/Time: 02/10/2019 11:59 AM Performed by: Cameron Ali, CRNA Pre-anesthesia Checklist: Patient identified, Emergency Drugs available, Suction available, Timeout performed and Patient being monitored Patient Re-evaluated:Patient Re-evaluated prior to induction Oxygen Delivery Method: Circle system utilized Preoxygenation: Pre-oxygenation with 100% oxygen Induction Type: IV induction LMA: LMA inserted LMA Size: 4.0 Number of attempts: 1 Placement Confirmation: positive ETCO2 and breath sounds checked- equal and bilateral Tube secured with: Tape Dental Injury: Teeth and Oropharynx as per pre-operative assessment

## 2019-02-10 NOTE — Discharge Instructions (Signed)
[place] DR. TROXLER, DR. Vickki Muff, AND DR. [place]   1. Take your medication as prescribed.  Pain medication should be taken only as needed.  2. Keep the dressing clean, dry and intact.  3. Keep your foot elevated above the heart level for the first 48 hours.  4. Walking to the bathroom and brief periods of walking are acceptable, unless we have instructed you to be non-weight bearing.  5. Always wear your post-op shoe when walking.  Always use your crutches if you are to be non-weight bearing.  6. Do not take a shower. Baths are permissible as long as the foot is kept out of the water.   7. Every hour you are awake:  - Bend your knee 15 times. - Flex foot 15 times - Massage calf 15 times  8. Call [facility] [phone]) if any of the following problems occur: - You develop a temperature or fever. - The bandage becomes saturated with blood. - Medication does not stop your pain. - Injury of the foot occurs. - Any symptoms of infection including redness, odor, or red streaks running from wound.  Information for Discharge Teaching: EXPAREL (bupivacaine liposome injectable suspension)   Your surgeon or anesthesiologist gave you EXPAREL(bupivacaine) to help control your pain after surgery.   EXPAREL is a local anesthetic that provides pain relief by numbing the tissue around the surgical site.  EXPAREL is designed to release pain medication over time and can control pain for up to 72 hours.  Depending on how you respond to EXPAREL, you may require less pain medication during your recovery.  Possible side effects:  Temporary loss of sensation or ability to move in the area where bupivacaine was injected.  Nausea, vomiting, constipation  Rarely, numbness and tingling in your mouth or lips, lightheadedness, or anxiety may occur.  Call your doctor right away  if you think you may be experiencing any of these sensations, or if you have other questions regarding possible side effects.  Follow all other discharge instructions given to you by your surgeon or nurse. Eat a healthy diet and drink plenty of water or other fluids.  If you return to the hospital for any reason within 96 hours following the administration of EXPAREL, it is important for health care providers to know that you have received this anesthetic. A teal colored band has been placed on your arm with the date, time and amount of EXPAREL you have received in order to alert and inform your health care providers. Please leave this armband in place for the full 96 hours following administration, and then you may remove the band.  Scopolamine skin patches REMOVE PATCH IN 72 HOURS AND [place] HANDS IMMEDIATELY What is this medicine? SCOPOLAMINE (skoe POL a meen) is used to prevent nausea and vomiting caused by motion sickness, anesthesia and surgery. This medicine may be used for other purposes; ask your health care provider or pharmacist if you have questions. COMMON BRAND NAME(S): Transderm Scop What should I tell my health care provider before I take this medicine? They need to know if you have any of these conditions:  are scheduled to have a gastric secretion test  glaucoma  heart disease  kidney disease  liver disease  lung or breathing disease, like asthma  mental illness  prostate disease  seizures  stomach or intestine problems  trouble passing urine  an unusual or allergic reaction to  scopolamine, atropine, other medicines, foods, dyes, or preservatives  pregnant or trying to get pregnant  breast-feeding How should I use this medicine? This medicine is for external use only. Follow the directions on the prescription label. Wear only 1 patch at a time. Choose an area behind the ear, that is clean, dry, hairless and free from any cuts or irritation. Wipe the area  with a clean dry tissue. Peel off the plastic backing of the skin patch, trying not to touch the adhesive side with your hands. Do not cut the patches. Firmly apply to the area you have chosen, with the metallic side of the patch to the skin and the tan-colored side showing. Once firmly in place, wash your hands well with soap and water. Do not get this medicine into your eyes. After removing the patch, wash your hands and the area behind your ear thoroughly with soap and water. The patch will still contain some medicine after use. To avoid accidental contact or ingestion by children or pets, fold the used patch in half with the sticky side together and throw away in the trash out of the reach of children and pets. If you need to use a second patch after you remove the first, place it behind the other ear. A special MedGuide will be given to you by the pharmacist with each prescription and refill. Be sure to read this information carefully each time. Talk to your pediatrician regarding the use of this medicine in children. Special care may be needed. Overdosage: If you think you have taken too much of this medicine contact a poison control center or emergency room at once. NOTE: This medicine is only for you. Do not share this medicine with others. What if I miss a dose? This does not apply. This medicine is not for regular use. What may interact with this medicine?  alcohol  antihistamines for allergy cough and cold  atropine  certain medicines for anxiety or sleep  certain medicines for bladder problems like oxybutynin, tolterodine  certain medicines for depression like amitriptyline, fluoxetine, sertraline  certain medicines for stomach problems like dicyclomine, hyoscyamine  certain medicines for Parkinson's disease like benztropine, trihexyphenidyl  certain medicines for seizures like phenobarbital, primidone  general anesthetics like halothane, isoflurane, methoxyflurane,  propofol  ipratropium  local anesthetics like lidocaine, pramoxine, tetracaine  medicines that relax muscles for surgery  phenothiazines like chlorpromazine, mesoridazine, prochlorperazine, thioridazine  narcotic medicines for pain  other belladonna alkaloids This list may not describe all possible interactions. Give your health care provider a list of all the medicines, herbs, non-prescription drugs, or dietary supplements you use. Also tell them if you smoke, drink alcohol, or use illegal drugs. Some items may interact with your medicine. What should I watch for while using this medicine? Limit contact with water while swimming and bathing because the patch may fall off. If the patch falls off, throw it away and put a new one behind the other ear. You may get drowsy or dizzy. Do not drive, use machinery, or do anything that needs mental alertness until you know how this medicine affects you. Do not stand or sit up quickly, especially if you are an older patient. This reduces the risk of dizzy or fainting spells. Alcohol may interfere with the effect of this medicine. Avoid alcoholic drinks. Your mouth may get dry. Chewing sugarless gum or sucking hard candy, and drinking plenty of water may help. Contact your healthcare professional if the problem does not go away  or is severe. This medicine may cause dry eyes and blurred vision. If you wear contact lenses, you may feel some discomfort. Lubricating drops may help. See your healthcare professional if the problem does not go away or is severe. If you are going to need surgery, an MRI, CT scan, or other procedure, tell your healthcare professional that you are using this medicine. You may need to remove the patch before the procedure. What side effects may I notice from receiving this medicine? Side effects that you should report to your doctor or health care professional as soon as possible:  allergic reactions like skin rash, itching or  hives; swelling of the face, lips, or tongue  blurred vision  changes in vision  confusion  dizziness  eye pain  fast, irregular heartbeat  hallucinations, loss of contact with reality  nausea, vomiting  pain or trouble passing urine  restlessness  seizures  skin irritation  stomach pain Side effects that usually do not require medical attention (report to your doctor or health care professional if they continue or are bothersome):  drowsiness  dry mouth  headache  sore throat This list may not describe all possible side effects. Call your doctor for medical advice about side effects. You may report side effects to FDA at 1-800-FDA-1088. Where should I keep my medicine? Keep out of the reach of children. Store at room temperature between 20 and 25 degrees C (68 and 77 degrees F). Keep this medicine in the foil package until ready to use. Throw away any unused medicine after the expiration date. NOTE: This sheet is a summary. It may not cover all possible information. If you have questions about this medicine, talk to your doctor, pharmacist, or health care provider.  2020 Elsevier/Gold Standard (2017-05-16 16:14:46)   General Anesthesia, Adult, Care After This sheet gives you information about how to care for yourself after your procedure. Your health care provider may also give you more specific instructions. If you have problems or questions, contact your health care provider. What can I expect after the procedure? After the procedure, the following side effects are common:  Pain or discomfort at the IV site.  Nausea.  Vomiting.  Sore throat.  Trouble concentrating.  Feeling cold or chills.  Weak or tired.  Sleepiness and fatigue.  Soreness and body aches. These side effects can affect parts of the body that were not involved in surgery. Follow these instructions at home:  For at least 24 hours after the procedure:  Have a responsible adult  stay with you. It is important to have someone help care for you until you are awake and alert.  Rest as needed.  Do not: ? Participate in activities in which you could fall or become injured. ? Drive. ? Use heavy machinery. ? Drink alcohol. ? Take sleeping pills or medicines that cause drowsiness. ? Make important decisions or sign legal documents. ? Take care of children on your own. Eating and drinking  Follow any instructions from your health care provider about eating or drinking restrictions.  When you feel hungry, start by eating small amounts of foods that are soft and easy to digest (bland), such as toast. Gradually return to your regular diet.  Drink enough fluid to keep your urine pale yellow.  If you vomit, rehydrate by drinking water, juice, or clear broth. General instructions  If you have sleep apnea, surgery and certain medicines can increase your risk for breathing problems. Follow instructions from your health care  provider about wearing your sleep device: ? Anytime you are sleeping, including during daytime naps. ? While taking prescription pain medicines, sleeping medicines, or medicines that make you drowsy.  Return to your normal activities as told by your health care provider. Ask your health care provider what activities are safe for you.  Take over-the-counter and prescription medicines only as told by your health care provider.  If you smoke, do not smoke without supervision.  Keep all follow-up visits as told by your health care provider. This is important. Contact a health care provider if:  You have nausea or vomiting that does not get better with medicine.  You cannot eat or drink without vomiting.  You have pain that does not get better with medicine.  You are unable to pass urine.  You develop a skin rash.  You have a fever.  You have redness around your IV site that gets worse. Get help right away if:  You have difficulty  breathing.  You have chest pain.  You have blood in your urine or stool, or you vomit blood. Summary  After the procedure, it is common to have a sore throat or nausea. It is also common to feel tired.  Have a responsible adult stay with you for the first 24 hours after general anesthesia. It is important to have someone help care for you until you are awake and alert.  When you feel hungry, start by eating small amounts of foods that are soft and easy to digest (bland), such as toast. Gradually return to your regular diet.  Drink enough fluid to keep your urine pale yellow.  Return to your normal activities as told by your health care provider. Ask your health care provider what activities are safe for you. This information is not intended to replace advice given to you by your health care provider. Make sure you discuss any questions you have with your health care provider. Document Released: 06/03/2000 Document Revised: 02/28/2017 Document Reviewed: 10/11/2016 Elsevier Patient Education  2020 [employer].

## 2019-02-10 NOTE — H&P (Signed)
HISTORY AND PHYSICAL INTERVAL NOTE:  02/10/2019  11:44 AM  Shannon Marsh  has presented today for surgery, with the diagnosis of M20.11 [place].  The various methods of treatment have been discussed with the patient.  No guarantees were given.  After consideration of risks, benefits and other options for treatment, the patient has consented to surgery.  I have reviewed the patients' chart and labs.     A history and physical examination was performed in my office.  The patient was reexamined.  There have been no changes to this history and physical examination.  Shannon Marsh A

## 2019-02-10 NOTE — Op Note (Signed)
Operative note   Surgeon:Navjot Pilgrim Lawyer: None    Preop diagnosis: Hallux valgus right foot    Postop diagnosis: Same    Procedure: 1.  Lapidus hallux valgus correction right foot 2.  Akin proximal phalanx osteotomy right proximal phalanx great toe    EBL: Minimal    Anesthesia:local and general.  Local consisted of a one-to-one mixture of 0.25 percent bupivacaine and Exparel long-acting anesthetic.  A total of 20 cc was used preoperatively.  10 cc of Exparel was used at the end of the case.    Hemostasis: Mid calf tourniquet inflated to 200 mmHg for 90 minutes    Specimen: None    Complications: None    Operative indications:Shannon Marsh is an 53 y.o. that presents today for surgical intervention.  The risks/benefits/alternatives/complications have been discussed and consent has been given.    Procedure:  Patient was brought into the OR and placed on the operating table in thesupine position. After anesthesia was obtained theright lower extremity was prepped and draped in usual sterile fashion.  Attention was directed to the dorsal aspect of the first metatarsocuneiform joint where a dorsal incision was performed.  Sharp and blunt dissection carried down to the capsule.  Capsulotomy was performed.  The joint was then freed.  The first metatarsal was rotated into a more anatomically aligned position with a K wire.  The joint positioner was placed on the medial aspect of the first metatarsal.  A small stab incision was made just lateral to the second metatarsal.  The positioner was placed and rotated into a more normal position with the fulcrum intact between the base of the first metatarsal and second metatarsal.  Good realignment was noted.  The cut guide was then placed into the joint and to converging cuts were made.  All of the articular cartilage was taken down to good bleeding bone.  The joint was then prepped with a 2.0 mm drill bit.  Compression was then applied  to the joint with good alignment.  A dorsal and medial locking plate from the lapiplasty set was placed.  Excellent realignment of the first MTP was noted.  Next a dorsal medial incision was performed.  Sharp and blunt dissection was carried down to the capsule.  A T capsulotomy was performed.  The intermetatarsal space was entered.  The DTI L was noted and transected.  The conjoined tendon of the abductor was noted and transected.  The prominent dorsomedial eminence was noted and transected and smoothed with a power rasp.  Mild residual valgus of the great toe was noted.  A proximal phalanx Akin osteotomy was created with the apex lateral along the midshaft.  This was stabilized with a compression staple.  Good alignment was noted.  The wounds were flushed with copious amounts of irrigation.  A small capsulorrhaphy was performed medially.  Closure was performed with a 3-0 Vicryl, 4-0 Vicryl, and 5-0 Monocryl for skin.  The small stab incision to the second metatarsal was closed with a 3-0 nylon.  A bulky sterile dressing was applied.  The area was infiltrated with 10 cc of Exparel.  Patient was placed in a nonambulatory boot.    Patient tolerated the procedure and anesthesia well.  Was transported from the OR to the PACU with all vital signs stable and vascular status intact. To be discharged per routine protocol.  Will follow up in approximately 1 week in the outpatient clinic.

## 2019-02-10 NOTE — Transfer of Care (Signed)
Immediate Anesthesia Transfer of Care Note  Patient: Shannon Marsh  Procedure(s) Performed: LAPIDUS TYPE RIGHT (Right Toe)  Patient Location: PACU  Anesthesia Type: General  Level of Consciousness: awake, alert  and patient cooperative  Airway and Oxygen Therapy: Patient Spontanous Breathing and Patient connected to supplemental oxygen  Post-op Assessment: Post-op Vital signs reviewed, Patient's Cardiovascular Status Stable, Respiratory Function Stable, Patent Airway and No signs of Nausea or vomiting  Post-op Vital Signs: Reviewed and stable  Complications: No apparent anesthesia complications

## 2019-02-10 NOTE — Anesthesia Postprocedure Evaluation (Signed)
Anesthesia Post Note  Patient: Shannon Marsh  Procedure(s) Performed: LAPIDUS TYPE RIGHT (Right Toe)     Patient location during evaluation: PACU Anesthesia Type: General Level of consciousness: awake Pain management: pain level controlled Vital Signs Assessment: post-procedure vital signs reviewed and stable Respiratory status: respiratory function stable Cardiovascular status: stable Postop Assessment: no signs of nausea or vomiting Anesthetic complications: no    Veda Canning

## 2019-02-11 ENCOUNTER — Encounter: Payer: Self-pay | Admitting: Podiatry

## 2019-03-09 ENCOUNTER — Other Ambulatory Visit: Payer: Self-pay

## 2019-03-09 ENCOUNTER — Ambulatory Visit (INDEPENDENT_AMBULATORY_CARE_PROVIDER_SITE_OTHER): Payer: Managed Care, Other (non HMO) | Admitting: Nurse Practitioner

## 2019-03-09 ENCOUNTER — Encounter: Payer: Self-pay | Admitting: Nurse Practitioner

## 2019-03-09 VITALS — BP 139/87 | Temp 97.5°F

## 2019-03-09 DIAGNOSIS — I10 Essential (primary) hypertension: Secondary | ICD-10-CM

## 2019-03-09 DIAGNOSIS — Z638 Other specified problems related to primary support group: Secondary | ICD-10-CM | POA: Diagnosis not present

## 2019-03-09 NOTE — Assessment & Plan Note (Signed)
Chronic, improved with Sertraline at 100 MG.  Continue this current dose.  Denies SI/HI.  Return in 7 weeks for face to face visit and labs.

## 2019-03-09 NOTE — Progress Notes (Signed)
BP 139/87   Temp (!) 97.5 F (36.4 C) (Temporal)   LMP 02/18/2012    Subjective:    Patient ID: Shannon Marsh, female    DOB: 06/03/1965, 53 y.o.   MRN: [id number]  HPI: Shannon Marsh is a 53 y.o. female  Chief Complaint  Patient presents with  . Hypertension  . Depression    . This visit was completed via Doximity due to the restrictions of the COVID-19 pandemic. All issues as above were discussed and addressed. Physical exam was done as above through visual confirmation on Doximity. If it was felt that the patient should be evaluated in the office, they were directed there. The patient verbally consented to this visit. . Location of the patient: home . Location of the provider: work . Those involved with this call:  . Provider: Marnee Guarneri, DNP . CMA: Yvonna Alanis, CMA . Front Desk/Registration: Jill Side  . Time spent on call: 15 minutes with patient face to face via video conference. More than 50% of this time was spent in counseling and coordination of care. 10 minutes total spent in review of patient's record and preparation of their chart.  . I verified patient identity using two factors (patient name and date of birth). Patient consents verbally to being seen via telemedicine visit today.    Shannon KitchenHYPERTENSION Currently taking Lisinopril 40 MG daily and Amlodipine 5 MG daily (started at last visit and having improvement now in BP).  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 Recent foot surgery, is not watching mother-in-law at this time.  She has been staying with sister-in-law, but had incident there and is in rehab at this time.   Hypertension status: stable Satisfied with current treatment? yes Duration of hypertension: chronic BP monitoring frequency:  daily BP range: 130-145/80-90, improving BP medication side effects:  no Medication compliance: good compliance Previous BP meds:Lisinopril Aspirin:  no Recurrent headaches: none Visual changes: no Palpitations: no Dyspnea: no Chest pain: no Lower extremity edema: no Dizzy/lightheaded: no   ANXIETY/STRESS Currently taking Sertraline 100 MG daily (increased at recent visit).  Reports this has improved mood. Duration:exacerbated Anxious mood: no Excessive worrying: no Irritability: no  Sweating: no Nausea: no Palpitations:no Hyperventilation: no Panic attacks: no Agoraphobia: no  Obscessions/compulsions: no Depressed mood: sometimes Depression screen [facility] 2/9 03/09/2019 06/17/2018 05/27/2018 01/28/2018 07/24/2017  Decreased Interest 0 1 0 0 0  Down, Depressed, Hopeless 0 1 0 0 0  PHQ - 2 Score 0 2 0 0 0  Altered sleeping 1 2 1 1  -  Tired, decreased energy 0 2 1 1  -  Change in appetite 1 2 2 2  -  Feeling bad or failure about yourself  0 2 1 1  -  Trouble concentrating 1 1 0 0 -  Moving slowly or fidgety/restless 0 0 0 0 -  Suicidal thoughts 0 0 0 0 -  PHQ-9 Score 3 11 5 5  -  Difficult doing work/chores Not difficult at all Somewhat difficult Not difficult at all Not difficult at all -   Anhedonia: no Weight changes: no Insomnia: none Hypersomnia: no Fatigue/loss of energy: no Feelings of worthlessness: no Feelings of guilt: no Impaired concentration/indecisiveness: yes Suicidal ideations: no  Crying spells: no Recent Stressors/Life Changes: yes, caregiver strain   Relationship problems: no   Family stress: yes     Financial stress: no    Job stress: no    Recent death/loss: no GAD 7 : Generalized  Anxiety Score 06/17/2018 05/27/2018 01/28/2018  Nervous, Anxious, on Edge 1 1 0  Control/stop worrying 2 3 1   Worry too much - different things 2 3 1   Trouble relaxing 2 2 1   Restless 2 2 0  Easily annoyed or irritable 2 3 0  Afraid - awful might happen 2 2 0  Total GAD 7 Score 13 16 3   Anxiety Difficulty Somewhat difficult Very difficult Not difficult at all    Relevant past medical, surgical, family and social  history reviewed and updated as indicated. Interim medical history since our last visit reviewed. Allergies and medications reviewed and updated.  Review of Systems  Constitutional: Negative for activity change, appetite change, diaphoresis, fatigue and fever.  Respiratory: Negative for cough, chest tightness and shortness of breath.   Cardiovascular: Negative for chest pain, palpitations and leg swelling.  Gastrointestinal: Negative for abdominal distention, abdominal pain, constipation, diarrhea, nausea and vomiting.  Neurological: Negative for dizziness, syncope, weakness, light-headedness, numbness and headaches.  Psychiatric/Behavioral: Negative.     Per HPI unless specifically indicated above     Objective:    BP 139/87   Temp (!) 97.5 F (36.4 C) (Temporal)   LMP 02/18/2012   Wt Readings from Last 3 Encounters:  02/10/19 196 lb (88.9 kg)  06/17/18 186 lb (84.4 kg)  03/18/18 183 lb (83 kg)    Physical Exam Vitals and nursing note reviewed.  Constitutional:      General: She is awake. She is not in acute distress.    Appearance: She is well-developed. She is not ill-appearing.  HENT:     Head: Normocephalic.     Right Ear: Hearing normal.     Left Ear: Hearing normal.  Eyes:     General: Lids are normal.        Right eye: No discharge.        Left eye: No discharge.     Conjunctiva/sclera: Conjunctivae normal.  Pulmonary:     Effort: Pulmonary effort is normal. No accessory muscle usage or respiratory distress.  Musculoskeletal:     Cervical back: Normal range of motion.  Neurological:     Mental Status: She is alert and oriented to person, place, and time.  Psychiatric:        Attention and Perception: Attention normal.        Mood and Affect: Mood normal.        Behavior: Behavior normal. Behavior is cooperative.        Thought Content: Thought content normal.        Judgment: Judgment normal.    Results for orders placed or performed during the hospital  encounter of 02/08/19  SARS CORONAVIRUS 2 (TAT 6-24 HRS) Nasopharyngeal Nasopharyngeal Swab   Specimen: Nasopharyngeal Swab  Result Value Ref Range   SARS Coronavirus 2 NEGATIVE NEGATIVE      Assessment & Plan:   Problem List Items Addressed This Visit      Cardiovascular and Mediastinum   Benign hypertension - Primary    Chronic, ongoing, improving. Continue current medication regimen, would consider increase in Amlodipine to 10 MG if consistent elevations noted.  Recommend she continue to monitor BP daily and document + focus on a low sodium (DASH) diet at home.  Return in 7 weeks for visit and labs in office.        Other   Caregiver role strain    Chronic, improved with Sertraline at 100 MG.  Continue this current dose.  Denies SI/HI.  Return  in 7 weeks for face to face visit and labs.         I discussed the assessment and treatment plan with the patient. The patient was provided an opportunity to ask questions and all were answered. The patient agreed with the plan and demonstrated an understanding of the instructions.   The patient was advised to call back or seek an in-person evaluation if the symptoms worsen or if the condition fails to improve as anticipated.   I provided 15 minutes of time during this encounter.  Follow up plan: Return in about 7 weeks (around 04/27/2019) for HTN and Mood follow-up.

## 2019-03-09 NOTE — Assessment & Plan Note (Signed)
Chronic, ongoing, improving. Continue current medication regimen, would consider increase in Amlodipine to 10 MG if consistent elevations noted.  Recommend she continue to monitor BP daily and document + focus on a low sodium (DASH) diet at home.  Return in 7 weeks for visit and labs in office.

## 2019-03-09 NOTE — Patient Instructions (Signed)

## 2019-03-10 ENCOUNTER — Encounter: Payer: Self-pay | Admitting: Nurse Practitioner

## 2019-03-10 NOTE — Progress Notes (Signed)
LVM TO MAKE 7 WEEK APPT, SENT LETTER.

## 2019-04-28 ENCOUNTER — Other Ambulatory Visit: Payer: Self-pay | Admitting: Nurse Practitioner

## 2019-05-07 ENCOUNTER — Ambulatory Visit (INDEPENDENT_AMBULATORY_CARE_PROVIDER_SITE_OTHER): Payer: Managed Care, Other (non HMO) | Admitting: Nurse Practitioner

## 2019-05-07 ENCOUNTER — Encounter: Payer: Self-pay | Admitting: Nurse Practitioner

## 2019-05-07 VITALS — BP 138/80 | HR 74

## 2019-05-07 DIAGNOSIS — Z638 Other specified problems related to primary support group: Secondary | ICD-10-CM

## 2019-05-07 DIAGNOSIS — I10 Essential (primary) hypertension: Secondary | ICD-10-CM | POA: Diagnosis not present

## 2019-05-07 MED ORDER — AMLODIPINE BESYLATE 10 MG PO TABS: 10.0000 mg | ORAL_TABLET | Freq: Every day | ORAL | 3 refills | Status: DC

## 2019-05-07 NOTE — Patient Instructions (Signed)
DASH Eating Plan DASH stands for "Dietary Approaches to Stop Hypertension." The DASH eating plan is a healthy eating plan that has been shown to reduce high blood pressure (hypertension). It may also reduce your risk for type 2 diabetes, heart disease, and stroke. The DASH eating plan may also help with weight loss. What are tips for following this plan?  General guidelines  Avoid eating more than 2,300 mg (milligrams) of salt (sodium) a day. If you have hypertension, you may need to reduce your sodium intake to 1,500 mg a day.  Limit alcohol intake to no more than 1 drink a day for nonpregnant women and 2 drinks a day for men. One drink equals 12 oz of beer, 5 oz of wine, or 1 oz of hard liquor.  Work with your health care provider to maintain a healthy body weight or to lose weight. Ask what an ideal weight is for you.  Get at least 30 minutes of exercise that causes your heart to beat faster (aerobic exercise) most days of the week. Activities may include walking, swimming, or biking.  Work with your health care provider or diet and nutrition specialist (dietitian) to adjust your eating plan to your individual calorie needs. Reading food labels   Check food labels for the amount of sodium per serving. Choose foods with less than 5 percent of the Daily Value of sodium. Generally, foods with less than 300 mg of sodium per serving fit into this eating plan.  To find whole grains, look for the word "whole" as the first word in the ingredient list. Shopping  Buy products labeled as "low-sodium" or "no salt added."  Buy fresh foods. Avoid canned foods and premade or frozen meals. Cooking  Avoid adding salt when cooking. Use salt-free seasonings or herbs instead of table salt or sea salt. Check with your health care provider or pharmacist before using salt substitutes.  Do not fry foods. Cook foods using healthy methods such as baking, boiling, grilling, and broiling instead.  Cook with  heart-healthy oils, such as olive, canola, soybean, or sunflower oil. Meal planning  Eat a balanced diet that includes: ? 5 or more servings of fruits and vegetables each day. At each meal, try to fill half of your plate with fruits and vegetables. ? Up to 6-8 servings of whole grains each day. ? Less than 6 oz of lean meat, poultry, or fish each day. A 3-oz serving of meat is about the same size as a deck of cards. One egg equals 1 oz. ? 2 servings of low-fat dairy each day. ? A serving of nuts, seeds, or beans 5 times each week. ? Heart-healthy fats. Healthy fats called Omega-3 fatty acids are found in foods such as flaxseeds and coldwater fish, like sardines, salmon, and mackerel.  Limit how much you eat of the following: ? Canned or prepackaged foods. ? Food that is high in trans fat, such as fried foods. ? Food that is high in saturated fat, such as fatty meat. ? Sweets, desserts, sugary drinks, and other foods with added sugar. ? Full-fat dairy products.  Do not salt foods before eating.  Try to eat at least 2 vegetarian meals each week.  Eat more home-cooked food and less restaurant, buffet, and fast food.  When eating at a restaurant, ask that your food be prepared with less salt or no salt, if possible. What foods are recommended? The items listed may not be a complete list. Talk with your dietitian about   what dietary choices are best for you. Grains Whole-grain or whole-wheat bread. Whole-grain or whole-wheat pasta. Brown rice. Oatmeal. Quinoa. Bulgur. Whole-grain and low-sodium cereals. Pita bread. Low-fat, low-sodium crackers. Whole-wheat flour tortillas. Vegetables Fresh or frozen vegetables (raw, steamed, roasted, or grilled). Low-sodium or reduced-sodium tomato and vegetable juice. Low-sodium or reduced-sodium tomato sauce and tomato paste. Low-sodium or reduced-sodium canned vegetables. Fruits All fresh, dried, or frozen fruit. Canned fruit in natural juice (without  added sugar). Meat and other protein foods Skinless chicken or turkey. Ground chicken or turkey. Pork with fat trimmed off. Fish and seafood. Egg whites. Dried beans, peas, or lentils. Unsalted nuts, nut butters, and seeds. Unsalted canned beans. Lean cuts of beef with fat trimmed off. Low-sodium, lean deli meat. Dairy Low-fat (1%) or fat-free (skim) milk. Fat-free, low-fat, or reduced-fat cheeses. Nonfat, low-sodium ricotta or cottage cheese. Low-fat or nonfat yogurt. Low-fat, low-sodium cheese. Fats and oils Soft margarine without trans fats. Vegetable oil. Low-fat, reduced-fat, or light mayonnaise and salad dressings (reduced-sodium). Canola, safflower, olive, soybean, and sunflower oils. Avocado. Seasoning and other foods Herbs. Spices. Seasoning mixes without salt. Unsalted popcorn and pretzels. Fat-free sweets. What foods are not recommended? The items listed may not be a complete list. Talk with your dietitian about what dietary choices are best for you. Grains Baked goods made with fat, such as croissants, muffins, or some breads. Dry pasta or rice meal packs. Vegetables Creamed or fried vegetables. Vegetables in a cheese sauce. Regular canned vegetables (not low-sodium or reduced-sodium). Regular canned tomato sauce and paste (not low-sodium or reduced-sodium). Regular tomato and vegetable juice (not low-sodium or reduced-sodium). Pickles. Olives. Fruits Canned fruit in a light or heavy syrup. Fried fruit. Fruit in cream or butter sauce. Meat and other protein foods Fatty cuts of meat. Ribs. Fried meat. Bacon. Sausage. Bologna and other processed lunch meats. Salami. Fatback. Hotdogs. Bratwurst. Salted nuts and seeds. Canned beans with added salt. Canned or smoked fish. Whole eggs or egg yolks. Chicken or turkey with skin. Dairy Whole or 2% milk, cream, and half-and-half. Whole or full-fat cream cheese. Whole-fat or sweetened yogurt. Full-fat cheese. Nondairy creamers. Whipped toppings.  Processed cheese and cheese spreads. Fats and oils Butter. Stick margarine. Lard. Shortening. Ghee. Bacon fat. Tropical oils, such as coconut, palm kernel, or palm oil. Seasoning and other foods Salted popcorn and pretzels. Onion salt, garlic salt, seasoned salt, table salt, and sea salt. Worcestershire sauce. Tartar sauce. Barbecue sauce. Teriyaki sauce. Soy sauce, including reduced-sodium. Steak sauce. Canned and packaged gravies. Fish sauce. Oyster sauce. Cocktail sauce. Horseradish that you find on the shelf. Ketchup. Mustard. Meat flavorings and tenderizers. Bouillon cubes. Hot sauce and Tabasco sauce. Premade or packaged marinades. Premade or packaged taco seasonings. Relishes. Regular salad dressings. Where to find more information:  National Heart, Lung, and Blood Institute: www.nhlbi.nih.gov  American Heart Association: www.heart.org Summary  The DASH eating plan is a healthy eating plan that has been shown to reduce high blood pressure (hypertension). It may also reduce your risk for type 2 diabetes, heart disease, and stroke.  With the DASH eating plan, you should limit salt (sodium) intake to 2,300 mg a day. If you have hypertension, you may need to reduce your sodium intake to 1,500 mg a day.  When on the DASH eating plan, aim to eat more fresh fruits and vegetables, whole grains, lean proteins, low-fat dairy, and heart-healthy fats.  Work with your health care provider or diet and nutrition specialist (dietitian) to adjust your eating plan to your   individual calorie needs. This information is not intended to replace advice given to you by your health care provider. Make sure you discuss any questions you have with your health care provider. Document Revised: 02/07/2017 Document Reviewed: 02/19/2016 Elsevier Patient Education  2020 [employer]  

## 2019-05-07 NOTE — Progress Notes (Signed)
BP 138/80   Pulse 74   LMP 02/18/2012    Subjective:    Patient ID: Shannon Marsh, female    DOB: Jun 02, 1965, 54 y.o.   MRN: [id number]  HPI: Shannon Marsh is a 54 y.o. female  Chief Complaint  Patient presents with  . Depression  . Hypertension    . This visit was completed via MyChart due to the restrictions of the COVID-19 pandemic. All issues as above were discussed and addressed. Physical exam was done as above through visual confirmation on MyChart. If it was felt that the patient should be evaluated in the office, they were directed there. The patient verbally consented to this visit. . Location of the patient: home . Location of the provider: work . Those involved with this call:  . Provider: Marnee Guarneri, DNP . CMA: Yvonna Alanis, CMA . Front Desk/Registration: Don Perking  . Time spent on call: 15 minutes with patient face to face via video conference. More than 50% of this time was spent in counseling and coordination of care. 10 minutes total spent in review of patient's record and preparation of their chart.  . I verified patient identity using two factors (patient name and date of birth). Patient consents verbally to being seen via telemedicine visit today.    DEPRESSION Continues on Sertraline 100 MG daily.  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 Mood status: stable Satisfied with current treatment?: yes Symptom severity: mild  Duration of current treatment : chronic Side effects: no Medication compliance: good compliance Psychotherapy/counseling: none Depressed mood: no Anxious mood: no Anhedonia: no Significant weight loss or gain: some gain, but has been less active due to surgery Insomnia: occasional Fatigue: no Feelings of worthlessness or guilt: no Impaired concentration/indecisiveness: no Suicidal ideations: no Hopelessness: no Crying spells: rare Depression screen [facility] 2/9 05/07/2019 03/09/2019 06/17/2018  05/27/2018 01/28/2018  Decreased Interest 0 0 1 0 0  Down, Depressed, Hopeless 1 0 1 0 0  PHQ - 2 Score 1 0 2 0 0  Altered sleeping 1 1 2 1 1   Tired, decreased energy 1 0 2 1 1   Change in appetite 1 1 2 2 2   Feeling bad or failure about yourself  0 0 2 1 1   Trouble concentrating 1 1 1  0 0  Moving slowly or fidgety/restless 0 0 0 0 0  Suicidal thoughts 0 0 0 0 0  PHQ-9 Score 5 3 11 5 5   Difficult doing work/chores Not difficult at all Not difficult at all Somewhat difficult Not difficult at all Not difficult at all   HYPERTENSION Continues on Lisinopril 40 MG daily and Amlodipine 5 MG daily.   Hypertension status: stable  Satisfied with current treatment? yes Duration of hypertension: chronic BP monitoring frequency:  daily BP range: 130/80 range on average occasional SBP 140 and DBP 90 BP medication side effects:  no Medication compliance: good compliance Aspirin: no Recurrent headaches: no Visual changes: no Palpitations: no Dyspnea: no Chest pain: no Lower extremity edema: no Dizzy/lightheaded: no  Relevant past medical, surgical, family and social history reviewed and updated as indicated. Interim medical history since our last visit reviewed. Allergies and medications reviewed and updated.  Review of Systems  Constitutional: Negative for activity change, appetite change, diaphoresis, fatigue and fever.  Respiratory: Negative for cough, chest tightness and shortness of breath.   Cardiovascular: Negative for chest pain, palpitations and leg swelling.  Gastrointestinal: Negative.   Neurological: Negative.   Psychiatric/Behavioral: Positive for decreased  concentration and sleep disturbance (ocasional due to hot flashes). Negative for self-injury and suicidal ideas. The patient is not nervous/anxious.     Per HPI unless specifically indicated above     Objective:    BP 138/80   Pulse 74   LMP 02/18/2012   Wt Readings from Last 3 Encounters:  02/10/19 196 lb (88.9  kg)  06/17/18 186 lb (84.4 kg)  03/18/18 183 lb (83 kg)    Physical Exam Vitals and nursing note reviewed.  Constitutional:      General: She is awake. She is not in acute distress.    Appearance: She is well-developed. She is not ill-appearing.  HENT:     Head: Normocephalic.     Right Ear: Hearing normal.     Left Ear: Hearing normal.  Eyes:     General: Lids are normal.        Right eye: No discharge.        Left eye: No discharge.     Conjunctiva/sclera: Conjunctivae normal.  Pulmonary:     Effort: Pulmonary effort is normal. No accessory muscle usage or respiratory distress.  Musculoskeletal:     Cervical back: Normal range of motion.  Neurological:     Mental Status: She is alert and oriented to person, place, and time.  Psychiatric:        Attention and Perception: Attention normal.        Mood and Affect: Mood normal.        Behavior: Behavior normal. Behavior is cooperative.        Thought Content: Thought content normal.        Judgment: Judgment normal.     Results for orders placed or performed during the hospital encounter of 02/08/19  SARS CORONAVIRUS 2 (TAT 6-24 HRS) Nasopharyngeal Nasopharyngeal Swab   Specimen: Nasopharyngeal Swab  Result Value Ref Range   SARS Coronavirus 2 NEGATIVE NEGATIVE      Assessment & Plan:   Problem List Items Addressed This Visit      Cardiovascular and Mediastinum   Benign hypertension - Primary    Chronic, ongoing, improving but not consistently at goal.  - Continue Lisinopril at 40 MG and increase Amlodipine to 10 MG (script sent and instructed patient to take two of her 5 MG daily until bottle complete).   - Recommend she continue to monitor BP daily and document + focus on a low sodium (DASH) diet at home.   - Return in 4 weeks for visit and labs in office.      Relevant Medications   amLODipine (NORVASC) 10 MG tablet     Other   Caregiver role strain    Chronic, stable with Sertraline at 100 MG.  Continue this  current dose and will adjust in future, possibly reduce if improvement in symptoms and stressors.  Denies SI/HI.           I discussed the assessment and treatment plan with the patient. The patient was provided an opportunity to ask questions and all were answered. The patient agreed with the plan and demonstrated an understanding of the instructions.   The patient was advised to call back or seek an in-person evaluation if the symptoms worsen or if the condition fails to improve as anticipated.   I provided 15+ minutes of time during this encounter.  Follow up plan: Return in about 4 weeks (around 06/04/2019) for HTN.

## 2019-05-07 NOTE — Assessment & Plan Note (Signed)
Chronic, ongoing, improving but not consistently at goal.  - Continue Lisinopril at 40 MG and increase Amlodipine to 10 MG (script sent and instructed patient to take two of her 5 MG daily until bottle complete).   - Recommend she continue to monitor BP daily and document + focus on a low sodium (DASH) diet at home.   - Return in 4 weeks for visit and labs in office.

## 2019-05-07 NOTE — Assessment & Plan Note (Signed)
Chronic, stable with Sertraline at 100 MG.  Continue this current dose and will adjust in future, possibly reduce if improvement in symptoms and stressors.  Denies SI/HI.   

## 2019-06-08 ENCOUNTER — Ambulatory Visit: Payer: Managed Care, Other (non HMO) | Admitting: Nurse Practitioner

## 2019-06-09 ENCOUNTER — Encounter: Payer: Self-pay | Admitting: Nurse Practitioner

## 2019-06-09 ENCOUNTER — Other Ambulatory Visit: Payer: Self-pay | Admitting: Nurse Practitioner

## 2019-06-09 ENCOUNTER — Other Ambulatory Visit: Payer: Self-pay

## 2019-06-09 ENCOUNTER — Ambulatory Visit (INDEPENDENT_AMBULATORY_CARE_PROVIDER_SITE_OTHER): Payer: Managed Care, Other (non HMO) | Admitting: Nurse Practitioner

## 2019-06-09 VITALS — BP 128/82 | HR 81 | Temp 98.5°F | Ht 64.17 in | Wt 204.6 lb

## 2019-06-09 DIAGNOSIS — E669 Obesity, unspecified: Secondary | ICD-10-CM

## 2019-06-09 DIAGNOSIS — Z23 Encounter for immunization: Secondary | ICD-10-CM

## 2019-06-09 DIAGNOSIS — M25511 Pain in right shoulder: Secondary | ICD-10-CM | POA: Insufficient documentation

## 2019-06-09 DIAGNOSIS — E7800 Pure hypercholesterolemia, unspecified: Secondary | ICD-10-CM | POA: Diagnosis not present

## 2019-06-09 DIAGNOSIS — I10 Essential (primary) hypertension: Secondary | ICD-10-CM | POA: Diagnosis not present

## 2019-06-09 DIAGNOSIS — E782 Mixed hyperlipidemia: Secondary | ICD-10-CM | POA: Insufficient documentation

## 2019-06-09 MED ORDER — PREDNISONE 10 MG PO TABS: ORAL_TABLET | ORAL | 0 refills | Status: DC

## 2019-06-09 NOTE — Assessment & Plan Note (Signed)
Acute, ongoing flares for several months.  Suspect impingement.  Will send in script for Prednisone and ordered physical therapy referral.  Discussed at length medication and reasoning for PT with patient.  Will also obtain imaging of shoulder.  If ongoing discomfort will consider referral to ortho.  Return for worsening or ongoing symptoms.

## 2019-06-09 NOTE — Assessment & Plan Note (Signed)
Recommend continued focus on healthy diet choices and regular physical activity (30 minutes 5 days a week).  

## 2019-06-09 NOTE — Assessment & Plan Note (Signed)
Check lipid panel today and recommend heavy focus on diet changes and weight loss. 

## 2019-06-09 NOTE — Patient Instructions (Signed)
Td (Tetanus, Diphtheria) Vaccine: What You Need to Know 1. Why get vaccinated? Td vaccine can prevent tetanus and diphtheria. Tetanus enters the body through cuts or wounds. Diphtheria spreads from person to person.  TETANUS (T) causes painful stiffening of the muscles. Tetanus can lead to serious health problems, including being unable to open the mouth, having trouble swallowing and breathing, or death.  DIPHTHERIA (D) can lead to difficulty breathing, heart failure, paralysis, or death. 2. Td vaccine Td is only for children 7 years and older, adolescents, and adults.  Td is usually given as a booster dose every 10 years, but it can also be given earlier after a severe and dirty wound or burn. Another vaccine, called Tdap, that protects against pertussis, also known as "whooping cough," in addition to tetanus and diphtheria, may be used instead of Td.  Td may be given at the same time as other vaccines. 3. Talk with your health care provider Tell your vaccine provider if the person getting the vaccine:  Has had an allergic reaction after a previous dose of any vaccine that protects against tetanus or diphtheria, or has any severe, life-threatening allergies.  Has ever had Guillain-Barr Syndrome (also called GBS).  Has had severe pain or swelling after a previous dose of any vaccine that protects against tetanus or diphtheria. In some cases, your health care provider may decide to postpone Td vaccination to a future visit.  People with minor illnesses, such as a cold, may be vaccinated. People who are moderately or severely ill should usually wait until they recover before getting Td vaccine.  Your health care provider can give you more information. 4. Risks of a vaccine reaction  Pain, redness, or swelling where the shot was given, mild fever, headache, feeling tired, and nausea, vomiting, diarrhea, or stomachache sometimes happen after Td vaccine. People sometimes faint after medical  procedures, including vaccination. Tell your provider if you feel dizzy or have vision changes or ringing in the ears.  As with any medicine, there is a very remote chance of a vaccine causing a severe allergic reaction, other serious injury, or death. 5. What if there is a serious problem? An allergic reaction could occur after the vaccinated person leaves the clinic. If you see signs of a severe allergic reaction (hives, swelling of the face and throat, difficulty breathing, a fast heartbeat, dizziness, or weakness), call 9-1-1 and get the person to the nearest hospital.  For other signs that concern you, call your health care provider.  Adverse reactions should be reported to the Vaccine Adverse Event Reporting System (VAERS). Your health care provider will usually file this report, or you can do it yourself. Visit the VAERS website at www.vaers.hhs.gov or call 1-800-822-7967. VAERS is only for reporting reactions, and VAERS staff do not give medical advice. 6. The National Vaccine Injury Compensation Program The National Vaccine Injury Compensation Program (VICP) is a federal program that was created to compensate people who may have been injured by certain vaccines. Visit the VICP website at www.hrsa.gov/vaccinecompensation or call 1-800-338-2382 to learn about the program and about filing a claim. There is a time limit to file a claim for compensation. 7. How can I learn more?  Ask your health care provider.  Call your local or state health department.  Contact the Centers for Disease Control and Prevention (CDC): ? Call 1-800-232-4636 (1-800-CDC-INFO) or ? Visit CDC's website at www.cdc.gov/vaccines Vaccine Information Statement Td Vaccine (06/10/18) This information is not intended to replace advice given   to you by your health care provider. Make sure you discuss any questions you have with your health care provider. Document Revised: 07/20/2018 Document Reviewed: 06/22/2018 Elsevier  Patient Education  2020 Elsevier Inc.  

## 2019-06-09 NOTE — Assessment & Plan Note (Signed)
Chronic, ongoing, improving. - Continue Lisinopril at 40 MG and Amlodipine 10 MG daily. - BMP today - Recommend she continue to monitor BP daily and document + focus on a low sodium (DASH) diet at home.   - Return in 6 months for annual physical

## 2019-06-09 NOTE — Progress Notes (Signed)
BP 128/82 (BP Location: Left Arm)   Pulse 81   Temp 98.5 F (36.9 C) (Oral)   Ht 5' 4.17" (1.63 m)   Wt 204 lb 9.6 oz (92.8 kg)   LMP 02/18/2012   SpO2 97%   BMI 34.93 kg/m    Subjective:    Patient ID: Shannon Marsh, female    DOB: 1965-05-19, 54 y.o.   MRN: [id number]  HPI: Shannon Marsh is a 54 y.o. female  Chief Complaint  Patient presents with  . Hypertension   HYPERTENSION Continues on Lisinopril 40 MG and increased Amlodipine to 10 MG daily at last visit.  Tolerating increased without ADR.  Does have history of elevation in LDL in June 2019 -- 123. Hypertension status: stable  Satisfied with current treatment? yes Duration of hypertension: chronic BP monitoring frequency:  a few times a week BP range: 132/84 last night -- on average 130-80 range at home BP medication side effects:  no Medication compliance: good compliance Aspirin: no Recurrent headaches: no Visual changes: no Palpitations: no Dyspnea: no Chest pain: no Lower extremity edema: no Dizzy/lightheaded: no  The 10-year ASCVD risk score Shannon Marsh DC Jr., et al., 2013) is: 2%   Values used to calculate the score:     Age: 2 years     Sex: Female     Is Non-Hispanic African American: No     Diabetic: No     Tobacco smoker: No     Systolic Blood Pressure: 0000000 mmHg     Is BP treated: Yes     HDL Cholesterol: 59 mg/dL     Total Cholesterol: 200 mg/dL   SHOULDER PAIN, RIGHT Present for months, right arm.  Has numbness in right hand and wrist on occasion.  Discomfort up in right shoulder.  Is right hand dominant.   Duration: months Location: right arm Mechanism of injury: unknown Onset: sudden Severity: mild  Quality:  burning Frequency: intermittent Radiation: yes -- numbness down into right hand and wrist Aggravating factors: movement  Alleviating factors: APAP  Status: fluctuating Treatments attempted: heat and APAP , wearing wrist brace at times Relief with NSAIDs?:  No NSAIDs  Taken Swelling: no Redness: no  Warmth: no Trauma: no Chest pain: no  Shortness of breath: no  Fever: no Decreased sensation: no Paresthesias: yes Weakness: no   Relevant past medical, surgical, family and social history reviewed and updated as indicated. Interim medical history since our last visit reviewed. Allergies and medications reviewed and updated.  Review of Systems  Constitutional: Negative for activity change, appetite change, diaphoresis, fatigue and fever.  Respiratory: Negative for cough, chest tightness and shortness of breath.   Cardiovascular: Negative for chest pain, palpitations and leg swelling.  Gastrointestinal: Negative.   Musculoskeletal: Positive for arthralgias.  Neurological: Negative.   Psychiatric/Behavioral: Negative.     Per HPI unless specifically indicated above     Objective:    BP 128/82 (BP Location: Left Arm)   Pulse 81   Temp 98.5 F (36.9 C) (Oral)   Ht 5' 4.17" (1.63 m)   Wt 204 lb 9.6 oz (92.8 kg)   LMP 02/18/2012   SpO2 97%   BMI 34.93 kg/m   Wt Readings from Last 3 Encounters:  06/09/19 204 lb 9.6 oz (92.8 kg)  02/10/19 196 lb (88.9 kg)  06/17/18 186 lb (84.4 kg)    Physical Exam Vitals and nursing note reviewed.  Constitutional:      General: She is awake. She is  not in acute distress.    Appearance: She is well-developed. She is obese. She is not ill-appearing.  HENT:     Head: Normocephalic.     Right Ear: Hearing normal.     Left Ear: Hearing normal.  Eyes:     General: Lids are normal.        Right eye: No discharge.        Left eye: No discharge.     Conjunctiva/sclera: Conjunctivae normal.     Pupils: Pupils are equal, round, and reactive to light.  Neck:     Thyroid: No thyromegaly.     Vascular: No carotid bruit.  Cardiovascular:     Rate and Rhythm: Normal rate and regular rhythm.     Heart sounds: Normal heart sounds. No murmur. No gallop.   Pulmonary:     Effort: Pulmonary effort is normal. No  accessory muscle usage or respiratory distress.     Breath sounds: Normal breath sounds.  Abdominal:     General: Bowel sounds are normal.     Palpations: Abdomen is soft.  Musculoskeletal:     Cervical back: Normal range of motion and neck supple.     Right lower leg: No edema.     Left lower leg: No edema.  Skin:    General: Skin is warm and dry.  Neurological:     Mental Status: She is alert and oriented to person, place, and time.  Psychiatric:        Attention and Perception: Attention normal.        Mood and Affect: Mood normal.        Speech: Speech normal.        Behavior: Behavior normal. Behavior is cooperative.        Thought Content: Thought content normal.       Shoulder: right    Inspection:  no swelling, ecchymosis, erythema or step off deformity.  Swelling: no none  Ecchymosis: none  Erythema: none  Deformity: none     Tenderness to Palpation:    Acromion: no    AC joint:no    Clavicle: no    Bicipital groove: no    Scapular spine: no    Coracoid process: no    Humeral head: no    Supraspinatus tendon: no     Range of Motion: full ROM with discomfort on extension    Abduction:Normal    Adduction: Normal with dicomfort    Flexion: Normal    Extension: Normal with discomfort    Internal rotation: Normal    External rotation: Normal    Painful arc: no     Muscle Strength: 5/5 bilaterally    Flexion: Normal8    Extension: Normal    Abduction: Normal    Adduction: Normal    External rotation: Normal    Internal rotation: Normal     Neuro: Sensation WNL. and Upper extremity reflexes WNL.     Special Tests:     Neer sign: Positive    Hawkins sign: Negative    Cross arm adduction: Negative  Results for orders placed or performed during the hospital encounter of 02/08/19  SARS CORONAVIRUS 2 (TAT 6-24 HRS) Nasopharyngeal Nasopharyngeal Swab   Specimen: Nasopharyngeal Swab  Result Value Ref Range   SARS Coronavirus 2 NEGATIVE NEGATIVE       Assessment & Plan:   Problem List Items Addressed This Visit      Cardiovascular and Mediastinum   Benign hypertension - Primary  Chronic, ongoing, improving. - Continue Lisinopril at 40 MG and Amlodipine 10 MG daily. - BMP today - Recommend she continue to monitor BP daily and document + focus on a low sodium (DASH) diet at home.   - Return in 6 months for annual physical      Relevant Orders   Basic metabolic panel     Other   Obesity (BMI 30.0-34.9)    Recommend continued focus on healthy diet choices and regular physical activity (30 minutes 5 days a week).       Acute pain of right shoulder    Acute, ongoing flares for several months.  Suspect impingement.  Will send in script for Prednisone and ordered physical therapy referral.  Discussed at length medication and reasoning for PT with patient.  Will also obtain imaging of shoulder.  If ongoing discomfort will consider referral to ortho.  Return for worsening or ongoing symptoms.      Relevant Medications   predniSONE (DELTASONE) 10 MG tablet   Other Relevant Orders   DG Shoulder Right   Ambulatory referral to Physical Therapy   Elevated LDL cholesterol level    Check lipid panel today and recommend heavy focus on diet changes and weight loss.      Relevant Orders   Lipid Panel w/o Chol/HDL Ratio    Other Visit Diagnoses    Need for Tdap vaccination           Follow up plan: Return in about 6 months (around 12/09/2019) for Annual physical  -- no pap history of hysterectomy -- no cervix.

## 2019-06-10 ENCOUNTER — Ambulatory Visit
Admission: RE | Admit: 2019-06-10 | Discharge: 2019-06-10 | Disposition: A | Discharge status: Home / Self Care | Payer: Managed Care, Other (non HMO) | Source: Ambulatory Visit | Attending: Nurse Practitioner | Admitting: Nurse Practitioner

## 2019-06-10 ENCOUNTER — Ambulatory Visit
Admission: RE | Admit: 2019-06-10 | Discharge: 2019-06-10 | Disposition: A | Discharge status: Home / Self Care | Payer: Managed Care, Other (non HMO) | Attending: Nurse Practitioner | Admitting: Nurse Practitioner

## 2019-06-10 DIAGNOSIS — M25511 Pain in right shoulder: Secondary | ICD-10-CM | POA: Diagnosis not present

## 2019-06-10 LAB — BASIC METABOLIC PANEL
BUN/Creatinine Ratio: 21 (ref 9–23)
BUN: 13 mg/dL (ref 6–24)
CO2: 22 mmol/L (ref 20–29)
Calcium: 9.5 mg/dL (ref 8.7–10.2)
Chloride: 102 mmol/L (ref 96–106)
Creatinine, Ser: 0.63 mg/dL (ref 0.57–1.00)
GFR calc Af Amer: 118 mL/min/{1.73_m2} (ref >59)
GFR calc non Af Amer: 103 mL/min/{1.73_m2} (ref >59)
Glucose: 104 mg/dL — ABNORMAL HIGH (ref 65–99)
Potassium: 4.3 mmol/L (ref 3.5–5.2)
Sodium: 138 mmol/L (ref 134–144)

## 2019-06-10 LAB — LIPID PANEL W/O CHOL/HDL RATIO
Cholesterol, Total: 223 mg/dL — ABNORMAL HIGH (ref 100–199)
HDL: 63 mg/dL (ref >39)
LDL Chol Calc (NIH): 140 mg/dL — ABNORMAL HIGH (ref 0–99)
Triglycerides: 112 mg/dL (ref 0–149)
VLDL Cholesterol Cal: 20 mg/dL (ref 5–40)

## 2019-06-10 NOTE — Progress Notes (Signed)
Contacted via MyChart The 10-year ASCVD risk score Mikey Bussing DC Jr., et al., 2013) is: 2.2%   Values used to calculate the score:     Age: 54 years     Sex: Female     Is Non-Hispanic African American: No     Diabetic: No     Tobacco smoker: No     Systolic Blood Pressure: 0000000 mmHg     Is BP treated: Yes     HDL Cholesterol: 63 mg/dL     Total Cholesterol: 223 mg/dL

## 2019-06-10 NOTE — Progress Notes (Signed)
Contacted via MyChart

## 2019-07-15 ENCOUNTER — Ambulatory Visit: Payer: Managed Care, Other (non HMO) | Attending: Nurse Practitioner

## 2019-07-20 ENCOUNTER — Ambulatory Visit: Payer: Managed Care, Other (non HMO)

## 2019-07-22 ENCOUNTER — Ambulatory Visit: Payer: Managed Care, Other (non HMO)

## 2019-08-12 ENCOUNTER — Other Ambulatory Visit: Payer: Self-pay | Admitting: Nurse Practitioner

## 2019-08-12 NOTE — Telephone Encounter (Signed)
Requested Prescriptions  Pending Prescriptions Disp Refills  . lisinopril (ZESTRIL) 40 MG tablet [Pharmacy Med Name: LISINOPRIL 40 MG TAB] 90 tablet 0    Sig: TAKE 1 TABLET BY MOUTH ONCE DAILY     Cardiovascular:  ACE Inhibitors Passed - 08/12/2019  6:39 PM      Passed - Cr in normal range and within 180 days    Creatinine  Date Value Ref Range Status  03/12/2014 0.60 0.60 - 1.30 mg/dL Final   Creatinine, Ser  Date Value Ref Range Status  06/09/2019 0.63 0.57 - 1.00 mg/dL Final         Passed - K in normal range and within 180 days    Potassium  Date Value Ref Range Status  06/09/2019 4.3 3.5 - 5.2 mmol/L Final  03/12/2014 3.6 3.5 - 5.1 mmol/L Final         Passed - Patient is not pregnant      Passed - Last BP in normal range    BP Readings from Last 1 Encounters:  06/09/19 128/82         Passed - Valid encounter within last 6 months    Recent Outpatient Visits          2 months ago Benign hypertension   [place], [place] T, NP   3 months ago Benign hypertension   [place], [place] T, NP   5 months ago Benign hypertension   [place], [employer] T, NP   6 months ago Benign hypertension   [place], Barbaraann Faster, NP   10 months ago Tendinitis   [place], Barbaraann Faster, NP      Future Appointments            In 3 months [place], Barbaraann Faster, NP [facility]

## 2019-09-01 ENCOUNTER — Ambulatory Visit: Payer: Managed Care, Other (non HMO) | Attending: Nurse Practitioner

## 2019-09-01 ENCOUNTER — Other Ambulatory Visit: Payer: Self-pay

## 2019-09-01 DIAGNOSIS — M25511 Pain in right shoulder: Secondary | ICD-10-CM | POA: Diagnosis present

## 2019-09-01 DIAGNOSIS — G8929 Other chronic pain: Secondary | ICD-10-CM

## 2019-09-01 DIAGNOSIS — M79631 Pain in right forearm: Secondary | ICD-10-CM | POA: Diagnosis present

## 2019-09-01 NOTE — Patient Instructions (Signed)
Pt was recommended to massage R posterior shoulder as well as use a heating pad to help relax muscles. Pt verbalized understanding.

## 2019-09-01 NOTE — Therapy (Signed)
[facility] [address] [place], [address] Phone: [phone]   Fax:  [phone]  Physical Therapy Evaluation  Patient Details  Name: Shannon Marsh MRN: [id number] Date of Birth: Oct 31, 1965 Referring Provider (PT): Marnee Guarneri, NP   Encounter Date: 09/01/2019   PT End of Session - 09/01/19 0942    Visit Number 1    Number of Visits 13    Date for PT Re-Evaluation 10/14/19    PT Start Time 0942   pt arrived late   PT Stop Time 1030    PT Time Calculation (min) 48 min    Activity Tolerance Patient tolerated treatment well    Behavior During Therapy [facility] for tasks assessed/performed           Past Medical History:  Diagnosis Date  . Anemia    prior to hysterectomy  . Bunion, right foot   . Gall stones   . GERD (gastroesophageal reflux disease)    with spicy foods  . Hx gestational diabetes   . Hypertension   . Varicose vein of leg     Past Surgical History:  Procedure Laterality Date  . 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 BILATERAL SALPINGECTOMY  03/23/2012   Procedure: BILATERAL SALPINGECTOMY;  Surgeon: Anastasio Auerbach, MD;  Location: [facility];  Service: Gynecology;  Laterality: Bilateral;  . BREAST CYST ASPIRATION Left    neg  . BUNIONECTOMY Left 03/25/2018   Procedure: LAPIDUS - TYPE LEFT;  Surgeon: Samara Deist, DPM;  Location: [facility];  Service: Podiatry;  Laterality: Left;  LAPIPLASTY SET GENERAL WITH LOCAL  . BUNIONECTOMY Left   . BUNIONECTOMY Right 02/10/2019   Procedure: LAPIDUS TYPE RIGHT;  Surgeon: Samara Deist, DPM;  Location: [place] [facility];  Service: Podiatry;  Laterality: Right;  general with local LAPIPLASTY SET  . CHOLECYSTECTOMY  03/09/14    There were no  vitals filed for this visit.    Subjective Assessment - 09/01/19 0947    Subjective R shoulder (posterior): 3/10 currently, 7/10 at worst for the past 3 months (pain radiates down her R UE and even onto her lateral elbow).    Pertinent History R shoulder pain. Also has L knee pain due to tripping and falling in the garage trying to catch her nephew. Landed mostly on L knee (has improved a lot). R shoulder pain. Pt feels like she overused her R UE about 6 months to a year ago. Initially had intense pain R lateral arm which goes up to her R shoulder (around the radial nerve distribution). Still has numbness R distal lateral forearm. Using her R hand a lot would cause pain in her R shoulder. Has pain in the back of her R shoulder, as well as at digits 1 to 3. Has a difficult time gripping when her first 3 digits are asleep at dorsal surface. Pt is R hand dominant.  Pt is R hand dominant.  Minimal complaints of neck pain.    Patient Stated Goals Be able to function without her fingers going to sleep.    Currently in Pain? Yes    Pain Score 3     Pain Location Shoulder    Pain Orientation Right    Pain Descriptors / Indicators Numbness;Sore    Pain Type Chronic pain  Pain Radiating Towards R UE around the radial nerve and C5/C6 distribution    Pain Onset More than a month ago    Pain Frequency Occasional    Aggravating Factors  pulling weeds, reaching behind her back, raising her R arm.    Pain Relieving Factors rest, massage, opening and closing her fingers.              [facility] PT Assessment - 09/01/19 0946      Assessment   Medical Diagnosis Acute pain of R shoulder    Referring Provider (PT) Marnee Guarneri, NP    Onset Date/Surgical Date 06/09/19   date PT referral signed   Hand Dominance Right    Prior Therapy None for R shoulder      Precautions   Precaution Comments none      Restrictions   Other Position/Activity Restrictions none      Balance Screen   Has the patient  fallen in the past 6 months No   Tripped due to rushing.    Has the patient had a decrease in activity level because of a fear of falling?  No    Is the patient reluctant to leave their home because of a fear of falling?  No      Observation/Other Assessments   Focus on Therapeutic Outcomes (FOTO)  Shoulder FOTO 64      Posture/Postural Control   Posture Comments protracted neck, L scapular protracted > R, slight L lateral shift neck and back. Movement crease around C5/C6 area.       AROM   Right Shoulder Flexion --   full with R forarm symptoms   Right Shoulder ABduction --   full with R forearm symptoms.    Right Shoulder Internal Rotation --   Functional IR: full   Right Shoulder External Rotation --   Functional ER: full but R UE "might go to sleep"   Cervical Flexion full    Cervical Extension WFL    Cervical - Right Side [facility] wiht L lateral neck tightness    Cervical - Left Side [facility] with R lateral neck tightness     Cervical - Right Rotation full    Cervical - Left Rotation full      Strength   Right Shoulder Flexion 4/5    Right Shoulder ABduction 4+/5    Right Shoulder Internal Rotation 4+/5    Right Shoulder External Rotation 4/5    Left Shoulder Flexion 4/5    Left Shoulder ABduction 4+/5    Left Shoulder Internal Rotation 4+/5    Left Shoulder External Rotation 4/5    Right Elbow Flexion 4/5    Right Elbow Extension 4+/5    Left Elbow Flexion 4/5    Left Elbow Extension 4/5    Right Wrist Extension 4/5    Left Wrist Extension 4/5      Special Tests   Other special tests (-) anterior apprehension test. (+) Neer's impingement. (-) Michel Bickers, and Yocum tests.                       Objective measurements completed on examination: See above findings.   No latex band allergies Blood pressure controlled per pt.        Posterior internal impingement?  affecting radial nerve?  Manual therapy  Seated STM R infraspinatus muscle.    Decreased symptoms.   Patient is a 54 year old female who came to physical therapy secondary  to R shoulder pain. She also presents with R UE pain along the radial nerve with numbness distal lateral arm, positive special test suggesting shoulder impingement, altered posture, muscle tension posterior R shoulder with tenderness, and difficulty performing tasks which involve pulling, as well as reaching behind her back. Patient will benefit from skilled physical therapy services to address the aforementioned deficits.         PT Education - 09/01/19 1334    Education Details massage R posterior shoulder, plan of care    Person(s) Educated Patient    Methods Explanation;Demonstration    Comprehension Verbalized understanding            PT Short Term Goals - 09/01/19 1315      PT SHORT TERM GOAL #1   Title Patient will be independent with her HEP to decrease R UE pain, improve strength and function.    Time 3    Period Weeks    Status New    Target Date 09/23/19             PT Long Term Goals - 09/01/19 1322      PT LONG TERM GOAL #1   Title Patient will have a decrease in R shoulder and UE pain to 3/10 or less at worst to promote ability to reach behind her back as well as to perform tasks such as pulling weeds in her garden more comfortably.    Baseline 7/10 R shoulder and UE pain at most for the past 3 months (09/01/2019)    Time 6    Period Weeks    Status New    Target Date 10/14/19      PT LONG TERM GOAL #2   Title Patient will improve her shoulder FOTO score by at least 10 points as a demonstration of improved function.    Baseline shoulder FOTO: 64 (09/01/2019)    Time 6    Period Weeks    Status New    Target Date 10/14/19                  Plan - 09/01/19 1310    Clinical Impression Statement Patient is a 54 year old female who came to physical therapy secondary to R shoulder pain. She also presents with R UE pain along the radial nerve with numbness  distal lateral arm, positive special test suggesting shoulder impingement, altered posture, muscle tension posterior R shoulder with tenderness, and difficulty performing tasks which involve pulling, as well as reaching behind her back. Patient will benefit from skilled physical therapy services to address the aforementioned deficits.    Personal Factors and Comorbidities Past/Current Experience;Time since onset of injury/illness/exacerbation    Examination-Activity Limitations Lift;Toileting    Stability/Clinical Decision Making Stable/Uncomplicated    Clinical Decision Making Low    Rehab Potential Good    PT Frequency 2x / week    PT Duration 6 weeks    PT Treatment/Interventions Therapeutic activities;Therapeutic exercise;Manual techniques;Electrical Stimulation;Iontophoresis 4mg /ml Dexamethasone;Ultrasound;Neuromuscular re-education;Patient/family education;Dry needling    PT Next Visit Plan scapular strengthening, manual technique, modalities PRN    Consulted and Agree with Plan of Care Patient           Patient will benefit from skilled therapeutic intervention in order to improve the following deficits and impairments:  Pain, Postural dysfunction, Improper body mechanics  Visit Diagnosis: Chronic right shoulder pain - Plan: PT plan of care cert/re-cert  Pain in right forearm - Plan: PT plan of care cert/re-cert  Problem List Patient Active Problem List   Diagnosis Date Noted  . Acute pain of right shoulder 06/09/2019  . Elevated LDL cholesterol level 06/09/2019  . Tendinitis 10/12/2018  . Folliculitis 72/55/0016  . Caregiver role strain 05/27/2018  . Obesity (BMI 30.0-34.9) 03/19/2018  . Cyst of left breast 01/28/2018  . Benign hypertension 07/24/2017    Joneen Boers PT, DPT   09/01/2019, 1:37 PM  [facility] [address], [place], [address] Phone: [phone]   Fax:  [phone]  Name:  DALYLAH RAMEY MRN: [id number] Date of Birth: Feb 08, 1966

## 2019-09-06 ENCOUNTER — Ambulatory Visit: Payer: Managed Care, Other (non HMO)

## 2019-09-06 ENCOUNTER — Other Ambulatory Visit: Payer: Self-pay

## 2019-09-06 DIAGNOSIS — M25511 Pain in right shoulder: Secondary | ICD-10-CM | POA: Diagnosis not present

## 2019-09-06 DIAGNOSIS — G8929 Other chronic pain: Secondary | ICD-10-CM

## 2019-09-06 DIAGNOSIS — M79631 Pain in right forearm: Secondary | ICD-10-CM

## 2019-09-06 NOTE — Patient Instructions (Signed)
Access Code: [email or web address] URL: [email or web address] Date: 09/06/2019 Prepared by: Joneen Boers  Exercises Seated Cervical Retraction - 3 x daily - 7 x weekly - 3 sets - 10 reps - 5 seconds hold

## 2019-09-06 NOTE — Therapy (Signed)
[facility] [address] [place], [address] Phone: [phone]   Fax:  [phone]  Physical Therapy Treatment  Patient Details  Name: Shannon Marsh MRN: [id number] Date of Birth: [birth year] Referring Provider (PT): Marnee Guarneri, NP   Encounter Date: 09/06/2019   PT End of Session - 09/06/19 1446    Visit Number 2    Number of Visits 13    Date for PT Re-Evaluation 10/14/19    PT Start Time 1761    PT Stop Time 1549    PT Time Calculation (min) 63 min    Activity Tolerance Patient tolerated treatment well    Behavior During Therapy [facility] for tasks assessed/performed           Past Medical History:  Diagnosis Date  . Anemia    prior to hysterectomy  . Bunion, right foot   . Gall stones   . GERD (gastroesophageal reflux disease)    with spicy foods  . Hx gestational diabetes   . Hypertension   . Varicose vein of leg     Past Surgical History:  Procedure Laterality Date  . 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 BILATERAL SALPINGECTOMY  03/23/2012   Procedure: BILATERAL SALPINGECTOMY;  Surgeon: Anastasio Auerbach, MD;  Location: [facility];  Service: Gynecology;  Laterality: Bilateral;  . BREAST CYST ASPIRATION Left    neg  . BUNIONECTOMY Left 03/25/2018   Procedure: LAPIDUS - TYPE LEFT;  Surgeon: Samara Deist, DPM;  Location: [place];  Service: Podiatry;  Laterality: Left;  LAPIPLASTY SET GENERAL WITH LOCAL  . BUNIONECTOMY Left   . BUNIONECTOMY Right 02/10/2019   Procedure: LAPIDUS TYPE RIGHT;  Surgeon: Samara Deist, DPM;  Location: [place];  Service: Podiatry;  Laterality: Right;  general with local LAPIPLASTY SET  . CHOLECYSTECTOMY  03/09/14    There were no vitals filed for this  visit.   Subjective Assessment - 09/06/19 1447    Subjective Did some more weeding this weekend, a little sore again. Has also been using the mouse at work. Digits 1-3 went to sleep while she was sleeping, R arm was up and she was lying on her R side. Not really tingling now. Still has the R distal lateral arm numbness. Was better after last week's session.    Pertinent History R shoulder pain. Also has L knee pain due to tripping and falling in the garage trying to catch her nephew. Landed mostly on L knee (has improved a lot). R shoulder pain. Pt feels like she overused her R UE about 6 months to a year ago. Initially had intense pain R lateral arm which goes up to her R shoulder (around the radial nerve distribution). Still has numbness R distal lateral forearm. Using her R hand a lot would cause pain in her R shoulder. Has pain in the back of her R shoulder, as well as at digits 1 to 3. Has a difficult time gripping when her first 3 digits are asleep at dorsal surface. Pt is R hand dominant.  Pt is R hand dominant.  Minimal complaints of neck pain.    Patient Stated Goals Be able to function without her fingers going to sleep.    Currently in Pain? Yes    Pain Score 2  Pain Location Shoulder    Pain Orientation Right;Posterior    Pain Descriptors / Indicators Sore    Pain Onset More than a month ago                                     PT Education - 09/06/19 1507    Education Details ther-ex, HEP    Person(s) Educated Patient    Methods Explanation;Demonstration;Tactile cues;Verbal cues;Handout    Comprehension Returned demonstration;Verbalized understanding         Objectives  Pt states blood pressure is controlled.    Medbridge Access Code [email or web address]  Manual therapy  Seated STM R rhomboid minor and upper trap muscle   Seated STM R infraspinatus muscle.              Decreased symptoms.   Supine STM posterior neck to decrease muscle tension with  promotion of cervical lordosis  Decreased R forearm numbness  Supine cervical traction manually   Decreased R forearm numbness    Therapeutic exercises  R shoulder IR red band 10x5 seconds. Increased R hand symptoms R shoulder ER red band 8x5 seconds. Increased R hand tingling, eases with rest R shoulder extension, palms facing backward red band 10x3 with 5 second holds with scapular retraction Bicep curls 5 lbs R UE 10x2. Still demonstrates R UE tingling  Chin Tucks 10x5 seconds for 3 sets   "weeding" movement: pt demonstrates cervical extension   Seated cervical flexion 10x10 seconds   No changes in R hand symptoms.   Reviewed HEP. Pt demonstrated and verbalized understanding. Handout provided.    Improved exercise technique, movement at target joints, use of target muscles after mod verbal, visual, tactile cues.     Response to treatment Decreased R forearm numbness to almost gone after session per pt.   Clinical Impression Decreased R lateral distal forearm numbness with treatment to decrease pressure to neck as well as promoting cervical lordosis. R posterior neck tight compared to L upon palpation. Pt will benefit from continued skilled physical therapy services to decrease pain, paresthesia, improve strength and function.     PT Short Term Goals - 09/01/19 1315      PT SHORT TERM GOAL #1   Title Patient will be independent with her HEP to decrease R UE pain, improve strength and function.    Time 3    Period Weeks    Status New    Target Date 09/23/19             PT Long Term Goals - 09/01/19 1322      PT LONG TERM GOAL #1   Title Patient will have a decrease in R shoulder and UE pain to 3/10 or less at worst to promote ability to reach behind her back as well as to perform tasks such as pulling weeds in her garden more comfortably.    Baseline 7/10 R shoulder and UE pain at most for the past 3 months (09/01/2019)    Time 6    Period Weeks    Status New     Target Date 10/14/19      PT LONG TERM GOAL #2   Title Patient will improve her shoulder FOTO score by at least 10 points as a demonstration of improved function.    Baseline shoulder FOTO: 64 (09/01/2019)    Time 6    Period Weeks    Status New  Target Date 10/14/19                 Plan - 09/06/19 1508    Clinical Impression Statement Decreased R lateral distal forearm numbness with treatment to decrease pressure to neck as well as promoting cervical lordosis. R posterior neck tight compared to L upon palpation. Pt will benefit from continued skilled physical therapy services to decrease pain, paresthesia, improve strength and function.    Personal Factors and Comorbidities Past/Current Experience;Time since onset of injury/illness/exacerbation    Examination-Activity Limitations Lift;Toileting    Stability/Clinical Decision Making Stable/Uncomplicated    Rehab Potential Good    PT Frequency 2x / week    PT Duration 6 weeks    PT Treatment/Interventions Therapeutic activities;Therapeutic exercise;Manual techniques;Electrical Stimulation;Iontophoresis 4mg /ml Dexamethasone;Ultrasound;Neuromuscular re-education;Patient/family education;Dry needling    PT Next Visit Plan scapular strengthening, manual technique, modalities PRN    PT Home Exercise Plan Medbridge Access Code [email or web address]    Consulted and Agree with Plan of Care Patient           Patient will benefit from skilled therapeutic intervention in order to improve the following deficits and impairments:  Pain, Postural dysfunction, Improper body mechanics  Visit Diagnosis: Chronic right shoulder pain  Pain in right forearm     Problem List Patient Active Problem List   Diagnosis Date Noted  . Acute pain of right shoulder 06/09/2019  . Elevated LDL cholesterol level 06/09/2019  . Tendinitis 10/12/2018  . Folliculitis 67/02/4579  . Caregiver role strain 05/27/2018  . Obesity (BMI 30.0-34.9) 03/19/2018  . Cyst  of left breast 01/28/2018  . Benign hypertension 07/24/2017    Joneen Boers PT, DPT   09/06/2019, 4:07 PM  [facility] [address], [place], [address] Phone: [phone]   Fax:  [phone]  Name: Shannon Marsh MRN: [id number] Date of Birth: Feb 26, 1966

## 2019-09-08 ENCOUNTER — Other Ambulatory Visit: Payer: Self-pay | Admitting: Nurse Practitioner

## 2019-09-09 ENCOUNTER — Other Ambulatory Visit: Payer: Self-pay

## 2019-09-09 ENCOUNTER — Ambulatory Visit: Payer: Managed Care, Other (non HMO) | Attending: Nurse Practitioner

## 2019-09-09 DIAGNOSIS — M25511 Pain in right shoulder: Secondary | ICD-10-CM | POA: Insufficient documentation

## 2019-09-09 DIAGNOSIS — G8929 Other chronic pain: Secondary | ICD-10-CM | POA: Diagnosis present

## 2019-09-09 DIAGNOSIS — M79631 Pain in right forearm: Secondary | ICD-10-CM | POA: Diagnosis present

## 2019-09-09 NOTE — Patient Instructions (Signed)
Access Code: [email or web address] URL: [email or web address] Date: 09/09/2019 Prepared by: Joneen Boers  Exercises Seated Cervical Retraction - 3 x daily - 7 x weekly - 3 sets - 10 reps - 5 seconds hold Seated Scapular Retraction - 1 x daily - 7 x weekly - 3 sets - 10 reps - 5 seconds hold

## 2019-09-09 NOTE — Therapy (Signed)
[facility] [place] [facility] [address], [place], [address] Phone: [phone]   Fax:  [phone]  Physical Therapy Treatment  Patient Details  Name: Shannon Marsh MRN: [id number] Date of Birth: 10-14-1965 Referring Provider (PT): Marnee Guarneri, NP   Encounter Date: 09/09/2019   PT End of Session - 09/09/19 1522    Visit Number 3    Number of Visits 13    Date for PT Re-Evaluation 10/14/19    PT Start Time 1522    PT Stop Time 1602    PT Time Calculation (min) 40 min    Activity Tolerance Patient tolerated treatment well    Behavior During Therapy [facility] for tasks assessed/performed           Past Medical History:  Diagnosis Date  . Anemia    prior to hysterectomy  . Bunion, right foot   . Gall stones   . GERD (gastroesophageal reflux disease)    with spicy foods  . Hx gestational diabetes   . Hypertension   . Varicose vein of leg     Past Surgical History:  Procedure Laterality Date  . 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 BILATERAL SALPINGECTOMY  03/23/2012   Procedure: BILATERAL SALPINGECTOMY;  Surgeon: Anastasio Auerbach, MD;  Location: [facility];  Service: Gynecology;  Laterality: Bilateral;  . BREAST CYST ASPIRATION Left    neg  . BUNIONECTOMY Left 03/25/2018   Procedure: LAPIDUS - TYPE LEFT;  Surgeon: Samara Deist, DPM;  Location: [facility];  Service: Podiatry;  Laterality: Left;  LAPIPLASTY SET GENERAL WITH LOCAL  . BUNIONECTOMY Left   . BUNIONECTOMY Right 02/10/2019   Procedure: LAPIDUS TYPE RIGHT;  Surgeon: Samara Deist, DPM;  Location: [facility];  Service: Podiatry;  Laterality: Right;  general with local LAPIPLASTY SET  . CHOLECYSTECTOMY  03/09/14    There were no vitals filed for this  visit.   Subjective Assessment - 09/09/19 1523    Subjective R shoulder does not seem any worse. The R forearm numbness is better.  No R shoulder pain currently, a little sore but pt was shelving books at [employer] and using the mouse.    Pertinent History R shoulder pain. Also has L knee pain due to tripping and falling in the garage trying to catch her nephew. Landed mostly on L knee (has improved a lot). R shoulder pain. Pt feels like she overused her R UE about 6 months to a year ago. Initially had intense pain R lateral arm which goes up to her R shoulder (around the radial nerve distribution). Still has numbness R distal lateral forearm. Using her R hand a lot would cause pain in her R shoulder. Has pain in the back of her R shoulder, as well as at digits 1 to 3. Has a difficult time gripping when her first 3 digits are asleep at dorsal surface. Pt is R hand dominant.  Pt is R hand dominant.  Minimal complaints of neck pain.    Patient Stated Goals Be able to function without her fingers going to sleep.    Currently in Pain? No/denies    Pain Onset More than a month ago  PT Education - 09/09/19 1611    Education Details manual therapy to decrease pressure to nerves    Person(s) Educated Patient    Methods Explanation;Demonstration;Tactile cues;Verbal cues    Comprehension Returned demonstration;Verbalized understanding          Objectives  Pt states blood pressure is controlled.    Medbridge Access Code [email or web address]  Manual therapy   Seated STM R infraspinatus muscle.  Decreased symptoms.   Supine STM posterior neck to decrease muscle tension with promotion of cervical lordosis             Decreased R forearm numbness  Supine cervical traction manually              Decreased R forearm numbness  seated STM R teres major muscle to decrease tension  Seated STM R medial triceps, proximal  portion       Response to treatment Only a tiny spot of numbness in R forearm left after manual therapy reported by pt. No complain of R shoulder pain.   Clinical Impression Decreased R UE paresthesia with treatment to decrease pressure to R C5/C6 vertebrae as well as decreasing muscle tension R posterior shoulder along the radial nerve pathway. Pt will benefit from continued skilled physical therapy services to decrease pain, improve ROM, strength, and function.       PT Short Term Goals - 09/01/19 1315      PT SHORT TERM GOAL #1   Title Patient will be independent with her HEP to decrease R UE pain, improve strength and function.    Time 3    Period Weeks    Status New    Target Date 09/23/19             PT Long Term Goals - 09/01/19 1322      PT LONG TERM GOAL #1   Title Patient will have a decrease in R shoulder and UE pain to 3/10 or less at worst to promote ability to reach behind her back as well as to perform tasks such as pulling weeds in her garden more comfortably.    Baseline 7/10 R shoulder and UE pain at most for the past 3 months (09/01/2019)    Time 6    Period Weeks    Status New    Target Date 10/14/19      PT LONG TERM GOAL #2   Title Patient will improve her shoulder FOTO score by at least 10 points as a demonstration of improved function.    Baseline shoulder FOTO: 64 (09/01/2019)    Time 6    Period Weeks    Status New    Target Date 10/14/19                 Plan - 09/09/19 1612    Clinical Impression Statement Decreased R UE paresthesia with treatment to decrease pressure to R C5/C6 vertebrae as well as decreasing muscle tension R posterior shoulder along the radial nerve pathway. Pt will benefit from continued skilled physical therapy services to decrease pain, improve ROM, strength, and function.    Personal Factors and Comorbidities Past/Current Experience;Time since onset of injury/illness/exacerbation    Examination-Activity  Limitations Lift;Toileting    Stability/Clinical Decision Making Stable/Uncomplicated    Rehab Potential Good    PT Frequency 2x / week    PT Duration 6 weeks    PT Treatment/Interventions Therapeutic activities;Therapeutic exercise;Manual techniques;Electrical Stimulation;Iontophoresis 4mg /ml Dexamethasone;Ultrasound;Neuromuscular re-education;Patient/family education;Dry needling    PT Next Visit Plan scapular  strengthening, manual technique, modalities PRN    PT Home Exercise Plan Medbridge Access Code [email or web address]    Consulted and Agree with Plan of Care Patient           Patient will benefit from skilled therapeutic intervention in order to improve the following deficits and impairments:  Pain, Postural dysfunction, Improper body mechanics  Visit Diagnosis: Chronic right shoulder pain  Pain in right forearm     Problem List Patient Active Problem List   Diagnosis Date Noted  . Acute pain of right shoulder 06/09/2019  . Elevated LDL cholesterol level 06/09/2019  . Tendinitis 10/12/2018  . Folliculitis 32/95/1884  . Caregiver role strain 05/27/2018  . Obesity (BMI 30.0-34.9) 03/19/2018  . Cyst of left breast 01/28/2018  . Benign hypertension 07/24/2017    Joneen Boers PT, DPT   09/09/2019, 4:14 PM  [facility] [address], [place], [address] Phone: [phone]   Fax:  [phone]  Name: Shannon Marsh MRN: [id number] Date of Birth: 1965-12-10

## 2019-09-14 ENCOUNTER — Ambulatory Visit: Payer: Managed Care, Other (non HMO)

## 2019-09-14 ENCOUNTER — Other Ambulatory Visit: Payer: Self-pay

## 2019-09-14 DIAGNOSIS — M25511 Pain in right shoulder: Secondary | ICD-10-CM

## 2019-09-14 DIAGNOSIS — M79631 Pain in right forearm: Secondary | ICD-10-CM

## 2019-09-14 NOTE — Therapy (Signed)
[facility] [address], [place], [address] Phone: [phone]   Fax:  [phone]  Physical Therapy Treatment  Patient Details  Name: Shannon Marsh MRN: [id number] Date of Birth: 12-03-1965 Referring Provider (PT): Marnee Guarneri, NP   Encounter Date: 09/14/2019   PT End of Session - 09/14/19 1303    Visit Number 4    Number of Visits 13    Date for PT Re-Evaluation 10/14/19    PT Start Time 1303    PT Stop Time 1344    PT Time Calculation (min) 41 min    Activity Tolerance Patient tolerated treatment well    Behavior During Therapy [facility] for tasks assessed/performed           Past Medical History:  Diagnosis Date   Anemia    prior to hysterectomy   Bunion, right foot    Gall stones    GERD (gastroesophageal reflux disease)    with spicy foods   Hx gestational diabetes    Hypertension    Varicose vein of leg     Past Surgical History:  Procedure Laterality Date   ABDOMINAL HYSTERECTOMY  03/23/2012   Procedure: HYSTERECTOMY ABDOMINAL;  Surgeon: Anastasio Auerbach, MD;  Location: [facility];  Service: Gynecology;  Laterality: N/A;  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   BILATERAL SALPINGECTOMY  03/23/2012   Procedure: BILATERAL SALPINGECTOMY;  Surgeon: Anastasio Auerbach, MD;  Location: [facility];  Service: Gynecology;  Laterality: Bilateral;   BREAST CYST ASPIRATION Left    neg   BUNIONECTOMY Left 03/25/2018   Procedure: LAPIDUS - TYPE LEFT;  Surgeon: Samara Deist, DPM;  Location: [facility];  Service: Podiatry;  Laterality: Left;  LAPIPLASTY SET GENERAL WITH LOCAL   BUNIONECTOMY Left    BUNIONECTOMY Right 02/10/2019   Procedure: LAPIDUS TYPE RIGHT;  Surgeon: Samara Deist, DPM;  Location: [facility];  Service: Podiatry;  Laterality: Right;  general with local LAPIPLASTY SET   CHOLECYSTECTOMY  03/09/14    There were no vitals filed for this  visit.   Subjective Assessment - 09/14/19 1304    Subjective R shoulder is doing fine. Did some cleaning Saturday which made it sore. Has not weeded. The R forearm numbness is pretty much gone. Fingers did not do the going a sleep pain.    Pertinent History R shoulder pain. Also has L knee pain due to tripping and falling in the garage trying to catch her nephew. Landed mostly on L knee (has improved a lot). R shoulder pain. Pt feels like she overused her R UE about 6 months to a year ago. Initially had intense pain R lateral arm which goes up to her R shoulder (around the radial nerve distribution). Still has numbness R distal lateral forearm. Using her R hand a lot would cause pain in her R shoulder. Has pain in the back of her R shoulder, as well as at digits 1 to 3. Has a difficult time gripping when her first 3 digits are asleep at dorsal surface. Pt is R hand dominant.  Pt is R hand dominant.  Minimal complaints of neck pain.    Patient Stated Goals Be able to function without her fingers going to sleep.    Currently in Pain? No/denies    Pain Onset More than a month ago  PT Education - 09/14/19 1323    Education Details ther-ex    Person(s) Educated Patient    Methods Explanation;Demonstration;Tactile cues;Verbal cues    Comprehension Returned demonstration;Verbalized understanding           Objectives  Pt states blood pressure is controlled.    MedbridgeAccess Code [email or web address]  Manual therapy   Supine cervical traction manually  Decreased R forearm numbness  Supine STM posterior neck to decrease muscle tension with promotion of cervical lordosis  Seated STM R upper trap muscle  Caudal glide R first rib grade 3 to decrease stiffness   Decreased R forearm symptoms after manual therapy    Therapeutic exercise  Supine deep cervical flexion 5x3  Supine B shoulder flexion 10x5 seconds for  3 sets to promote thoracic extension and posterior tipping of scapula  R lower trap raise on table (pt sitting on chair, resting forehead on forearm) 10x5 seconds, R index finger tingling which eases with rest.    Improved exercise technique, movement at target joints, use of target muscles after min to mod verbal, visual, tactile cues.              Response to treatment Decreased R forearm symptoms with treatment to decrease pressure to cervical nerves.     Clinical Impression Decreased R UE paresthesia with treatment to decrease pressure to R C5/C6 vertebrae as well as decreasing muscle tension to R upper trap and scalene muscles. Pt tolerated session well without aggravation of symptoms. Pt will benefit from continued skilled physical therapy services to decrease pain, improve strength and function.        PT Short Term Goals - 09/01/19 1315      PT SHORT TERM GOAL #1   Title Patient will be independent with her HEP to decrease R UE pain, improve strength and function.    Time 3    Period Weeks    Status New    Target Date 09/23/19             PT Long Term Goals - 09/01/19 1322      PT LONG TERM GOAL #1   Title Patient will have a decrease in R shoulder and UE pain to 3/10 or less at worst to promote ability to reach behind her back as well as to perform tasks such as pulling weeds in her garden more comfortably.    Baseline 7/10 R shoulder and UE pain at most for the past 3 months (09/01/2019)    Time 6    Period Weeks    Status New    Target Date 10/14/19      PT LONG TERM GOAL #2   Title Patient will improve her shoulder FOTO score by at least 10 points as a demonstration of improved function.    Baseline shoulder FOTO: 64 (09/01/2019)    Time 6    Period Weeks    Status New    Target Date 10/14/19                 Plan - 09/14/19 1255    Clinical Impression Statement Decreased R UE paresthesia with treatment to decrease pressure to R  C5/C6 vertebrae as well as decreasing muscle tension to R upper trap and scalene muscles. Pt tolerated session well without aggravation of symptoms. Pt will benefit from continued skilled physical therapy services to decrease pain, improve strength and function.    Personal Factors and Comorbidities Past/Current Experience;Time since onset of injury/illness/exacerbation  Examination-Activity Limitations Lift;Toileting    Stability/Clinical Decision Making Stable/Uncomplicated    Rehab Potential Good    PT Frequency 2x / week    PT Duration 6 weeks    PT Treatment/Interventions Therapeutic activities;Therapeutic exercise;Manual techniques;Electrical Stimulation;Iontophoresis 4mg /ml Dexamethasone;Ultrasound;Neuromuscular re-education;Patient/family education;Dry needling    PT Next Visit Plan scapular strengthening, manual technique, modalities PRN    PT Home Exercise Plan Medbridge Access Code [email or web address]    Consulted and Agree with Plan of Care Patient           Patient will benefit from skilled therapeutic intervention in order to improve the following deficits and impairments:  Pain, Postural dysfunction, Improper body mechanics  Visit Diagnosis: Chronic right shoulder pain  Pain in right forearm     Problem List Patient Active Problem List   Diagnosis Date Noted   Acute pain of right shoulder 06/09/2019   Elevated LDL cholesterol level 06/09/2019   Tendinitis 71/08/2692   Folliculitis 85/46/2703   Caregiver role strain 05/27/2018   Obesity (BMI 30.0-34.9) 03/19/2018   Cyst of left breast 01/28/2018   Benign hypertension 07/24/2017    Joneen Boers PT, DPT   09/14/2019, 3:06 PM  [facility] [address] [place], [address] Phone: [phone]   Fax:  [phone]  Name: Shannon Marsh MRN: [id number] Date of Birth: 1965-12-12

## 2019-09-20 ENCOUNTER — Ambulatory Visit: Payer: Managed Care, Other (non HMO)

## 2019-09-20 ENCOUNTER — Telehealth: Payer: Self-pay

## 2019-09-20 NOTE — Telephone Encounter (Signed)
No show. Called patient and left a message pertaining to appointment and a reminder for the next follow up session. Return phone call requested. Phone number ([phone]) provided.   

## 2019-10-04 ENCOUNTER — Ambulatory Visit: Payer: Managed Care, Other (non HMO)

## 2019-10-07 ENCOUNTER — Ambulatory Visit: Payer: Managed Care, Other (non HMO)

## 2019-11-08 ENCOUNTER — Telehealth: Payer: Self-pay | Admitting: Nurse Practitioner

## 2019-11-08 NOTE — Telephone Encounter (Signed)
Change to virtual sick visit please and reschedule CPE.  I would recommend Covid testing since she is having symptoms, prior to visit if possible.  Thanks

## 2019-11-08 NOTE — Telephone Encounter (Signed)
Noted  

## 2019-11-08 NOTE — Telephone Encounter (Signed)
Copied from [id number]. Topic: Appointment Scheduling - Scheduling Inquiry for Clinic >> Nov 08, 2019  2:17 PM Scherrie Gerlach wrote: Reason for CRM: pt sat with a child over the weekend and thinks she cault his "cold".  Pt has runny nose, scratchy throat. No fever.  Pt has cpe on Wed, and has meds she needs refilled.  Can she come in, or schedule virtual and do cpe at a later time?

## 2019-11-08 NOTE — Telephone Encounter (Signed)
Called pt changed appt to virtual she states that she try to go get covid test done tomorrow

## 2019-11-10 ENCOUNTER — Other Ambulatory Visit: Payer: Managed Care, Other (non HMO)

## 2019-11-10 ENCOUNTER — Telehealth (INDEPENDENT_AMBULATORY_CARE_PROVIDER_SITE_OTHER): Payer: Managed Care, Other (non HMO) | Admitting: Nurse Practitioner

## 2019-11-10 ENCOUNTER — Other Ambulatory Visit: Payer: Self-pay | Admitting: Sleep Medicine

## 2019-11-10 ENCOUNTER — Encounter: Payer: Self-pay | Admitting: Nurse Practitioner

## 2019-11-10 VITALS — BP 122/86 | HR 85 | Temp 97.8°F | Wt 195.0 lb

## 2019-11-10 DIAGNOSIS — Z1231 Encounter for screening mammogram for malignant neoplasm of breast: Secondary | ICD-10-CM | POA: Diagnosis not present

## 2019-11-10 DIAGNOSIS — Z1211 Encounter for screening for malignant neoplasm of colon: Secondary | ICD-10-CM

## 2019-11-10 DIAGNOSIS — J069 Acute upper respiratory infection, unspecified: Secondary | ICD-10-CM | POA: Diagnosis not present

## 2019-11-10 DIAGNOSIS — I471 Supraventricular tachycardia: Secondary | ICD-10-CM

## 2019-11-10 MED ORDER — AMLODIPINE BESYLATE 10 MG PO TABS: 10.0000 mg | ORAL_TABLET | Freq: Every day | ORAL | 4 refills | Status: DC

## 2019-11-10 MED ORDER — LISINOPRIL 40 MG PO TABS: 40.0000 mg | ORAL_TABLET | Freq: Every day | ORAL | 4 refills | Status: DC

## 2019-11-10 MED ORDER — PREDNISONE 20 MG PO TABS: 40.0000 mg | ORAL_TABLET | Freq: Every day | ORAL | 0 refills | Status: AC

## 2019-11-10 MED ORDER — FLUTICASONE PROPIONATE 50 MCG/ACT NA SUSP: 2.0000 | Freq: Every day | NASAL | 6 refills | Status: DC

## 2019-11-10 MED ORDER — SERTRALINE HCL 100 MG PO TABS: 100.0000 mg | ORAL_TABLET | Freq: Every day | ORAL | 4 refills | Status: DC

## 2019-11-10 NOTE — Progress Notes (Signed)
BP 122/86   Pulse 85   Temp 97.8 F (36.6 C) (Temporal)   Wt 195 lb (88.5 kg)   LMP 02/18/2012   BMI 33.29 kg/m    Subjective:    Patient ID: Shannon Marsh, female    DOB: [birth year], 54 y.o.   MRN: [id number]  HPI: Shannon Marsh is a 54 y.o. female  Chief Complaint  Patient presents with  . Cough    symptoms started last Sunday night. has tried OTC coricidin  . Nasal Congestion  . Sore Throat  . Medication Refill    amlodipine    . This visit was completed via MyChart due to the restrictions of the COVID-19 pandemic. All issues as above were discussed and addressed. Physical exam was done as above through visual confirmation on MyChart. If it was felt that the patient should be evaluated in the office, they were directed there. The patient verbally consented to this visit. . Location of the patient: home . Location of the provider: work . Those involved with this call:  . Provider: Marnee Guarneri, DNP . CMA: Yvonna Alanis, CMA . Front Desk/Registration: Don Perking  . Time spent on call: 20 minutes with patient face to face via video conference. More than 50% of this time was spent in counseling and coordination of care. 15 minutes total spent in review of patient's record and preparation of their chart.  . I verified patient identity using two factors (patient name and date of birth). Patient consents verbally to being seen via telemedicine visit today.    UPPER RESPIRATORY TRACT INFECTION Symptoms started on Sunday, was around a child who was not well last Tuesday and Wednesday -- his brother was Covid negative.  Has been Covid vaccinated, last June 2021.    Denies loss of taste or smell.    Needs refills sent in today on daily medications. Fever: no Cough: yes Shortness of breath: no Wheezing: no Chest pain: none Chest tightness: no Chest congestion: no Nasal congestion: yes Runny nose: yes Post nasal drip: yes Sneezing: no Sore throat:  a little bit Swollen glands: no Sinus pressure: a little bit under eyes Headache: no Face pain: no Toothache: no Ear pain: none Ear pressure: none Eyes red/itching:no Eye drainage/crusting: no  Vomiting: no Rash: no Fatigue: yes Sick contacts: yes Strep contacts: no  Context: stable Recurrent sinusitis: no Relief with OTC cold/cough medications: yes  Treatments attempted: Coricidin   Relevant past medical, surgical, family and social history reviewed and updated as indicated. Interim medical history since our last visit reviewed. Allergies and medications reviewed and updated.  Review of Systems  Constitutional: Positive for fatigue. Negative for activity change, appetite change, chills, diaphoresis and fever.  HENT: Positive for congestion, postnasal drip, rhinorrhea and sore throat. Negative for ear discharge, ear pain, facial swelling, sinus pressure, sinus pain, sneezing and voice change.   Eyes: Negative for pain and visual disturbance.  Respiratory: Positive for cough. Negative for chest tightness, shortness of breath and wheezing.   Cardiovascular: Negative for chest pain, palpitations and leg swelling.  Gastrointestinal: Negative for abdominal distention, abdominal pain, constipation, diarrhea, nausea and vomiting.  Endocrine: Negative.   Musculoskeletal: Positive for myalgias.  Neurological: Negative for dizziness, numbness and headaches.  Psychiatric/Behavioral: Negative.     Per HPI unless specifically indicated above     Objective:    BP 122/86   Pulse 85   Temp 97.8 F (36.6 C) (Temporal)   Wt 195 lb (88.5 kg)  LMP 02/18/2012   BMI 33.29 kg/m   Wt Readings from Last 3 Encounters:  11/10/19 195 lb (88.5 kg)  06/09/19 204 lb 9.6 oz (92.8 kg)  02/10/19 196 lb (88.9 kg)    Physical Exam Vitals and nursing note reviewed.  Constitutional:      General: She is awake. She is not in acute distress.    Appearance: She is well-developed and well-groomed.  She is ill-appearing.     Comments: Congested sounding and mildly hoarse.  HENT:     Head: Normocephalic.     Right Ear: Hearing normal.     Left Ear: Hearing normal.  Eyes:     General: Lids are normal.        Right eye: No discharge.        Left eye: No discharge.     Conjunctiva/sclera: Conjunctivae normal.  Pulmonary:     Effort: Pulmonary effort is normal. No accessory muscle usage or respiratory distress.  Musculoskeletal:     Cervical back: Normal range of motion.  Neurological:     Mental Status: She is alert and oriented to person, place, and time.  Psychiatric:        Attention and Perception: Attention normal.        Mood and Affect: Mood normal.        Behavior: Behavior normal. Behavior is cooperative.        Thought Content: Thought content normal.        Judgment: Judgment normal.     Results for orders placed or performed in visit on 40/98/11  Basic metabolic panel  Result Value Ref Range   Glucose 104 (H) 65 - 99 mg/dL   BUN 13 6 - 24 mg/dL   Creatinine, Ser 0.63 0.57 - 1.00 mg/dL   GFR calc non Af Amer 103 >59 mL/min/1.73   GFR calc Af Amer 118 >59 mL/min/1.73   BUN/Creatinine Ratio 21 9 - 23   Sodium 138 134 - 144 mmol/L   Potassium 4.3 3.5 - 5.2 mmol/L   Chloride 102 96 - 106 mmol/L   CO2 22 20 - 29 mmol/L   Calcium 9.5 8.7 - 10.2 mg/dL  Lipid Panel w/o Chol/HDL Ratio  Result Value Ref Range   Cholesterol, Total 223 (H) 100 - 199 mg/dL   Triglycerides 112 0 - 149 mg/dL   HDL 63 >39 mg/dL   VLDL Cholesterol Cal 20 5 - 40 mg/dL   LDL Chol Calc (NIH) 140 (H) 0 - 99 mg/dL      Assessment & Plan:   Problem List Items Addressed This Visit      Respiratory   Viral upper respiratory tract infection - Primary    Acute x 4 days.  Have recommended she obtain Covid testing due to current symptoms and pandemic.  She is vaccinated and discussed breakthrough cases with her.  Symptoms and exam findings are most consistent with a viral upper respiratory  infection. These usually run their course in 5-7 days. Unfortunately, antibiotics don't work against viruses and just increase risk of other issues such as diarrhea, yeast infections, and resistant infections.  Current plan: - Script for Prednisone and Flonase sent in - Increased rest - Increasing fluids - Acetaminophen as needed for fever/pain.  - Salt water gargling, chloraseptic spray and throat lozenges - OTC Coricidin as needed - Saline sinus flushes or a neti pot.  - Humidifying the air. Return for worsening symptoms, if ongoing >7 days will consider abx script.  Other Visit Diagnoses    Colon cancer screening       GI referral placed   Relevant Orders   Ambulatory referral to Gastroenterology   Encounter for screening mammogram for malignant neoplasm of breast       Mammogram order in   Relevant Orders   MM DIGITAL SCREENING BILATERAL      I discussed the assessment and treatment plan with the patient. The patient was provided an opportunity to ask questions and all were answered. The patient agreed with the plan and demonstrated an understanding of the instructions.   The patient was advised to call back or seek an in-person evaluation if the symptoms worsen or if the condition fails to improve as anticipated.   I provided 21+ minutes of time during this encounter.  Follow up plan: Return if symptoms worsen or fail to improve.

## 2019-11-10 NOTE — Assessment & Plan Note (Signed)
Acute x 4 days.  Have recommended she obtain Covid testing due to current symptoms and pandemic.  She is vaccinated and discussed breakthrough cases with her.  Symptoms and exam findings are most consistent with a viral upper respiratory infection. These usually run their course in 5-7 days. Unfortunately, antibiotics don't work against viruses and just increase risk of other issues such as diarrhea, yeast infections, and resistant infections.  Current plan: - Script for Prednisone and Flonase sent in - Increased rest - Increasing fluids - Acetaminophen as needed for fever/pain.  - Salt water gargling, chloraseptic spray and throat lozenges - OTC Coricidin as needed - Saline sinus flushes or a neti pot.  - Humidifying the air. Return for worsening symptoms, if ongoing >7 days will consider abx script.

## 2019-11-10 NOTE — Patient Instructions (Signed)

## 2019-11-11 LAB — NOVEL CORONAVIRUS, NAA: SARS-CoV-2, NAA: NOT DETECTED

## 2019-11-17 ENCOUNTER — Other Ambulatory Visit: Payer: Self-pay | Admitting: Nurse Practitioner

## 2019-11-17 ENCOUNTER — Encounter: Payer: Self-pay | Admitting: *Deleted

## 2019-11-17 MED ORDER — PREDNISONE 20 MG PO TABS: 40.0000 mg | ORAL_TABLET | Freq: Every day | ORAL | 0 refills | Status: AC

## 2020-01-04 ENCOUNTER — Other Ambulatory Visit: Payer: Self-pay

## 2020-01-04 ENCOUNTER — Encounter: Payer: Self-pay | Admitting: Nurse Practitioner

## 2020-01-04 ENCOUNTER — Ambulatory Visit (INDEPENDENT_AMBULATORY_CARE_PROVIDER_SITE_OTHER): Payer: Managed Care, Other (non HMO) | Admitting: Nurse Practitioner

## 2020-01-04 VITALS — BP 124/83 | HR 84 | Temp 98.3°F | Resp 16 | Ht 64.0 in | Wt 201.0 lb

## 2020-01-04 DIAGNOSIS — E669 Obesity, unspecified: Secondary | ICD-10-CM

## 2020-01-04 DIAGNOSIS — Z114 Encounter for screening for human immunodeficiency virus [HIV]: Secondary | ICD-10-CM

## 2020-01-04 DIAGNOSIS — E7800 Pure hypercholesterolemia, unspecified: Secondary | ICD-10-CM

## 2020-01-04 DIAGNOSIS — Z1159 Encounter for screening for other viral diseases: Secondary | ICD-10-CM | POA: Diagnosis not present

## 2020-01-04 DIAGNOSIS — L309 Dermatitis, unspecified: Secondary | ICD-10-CM

## 2020-01-04 DIAGNOSIS — Z638 Other specified problems related to primary support group: Secondary | ICD-10-CM

## 2020-01-04 DIAGNOSIS — N951 Menopausal and female climacteric states: Secondary | ICD-10-CM | POA: Insufficient documentation

## 2020-01-04 DIAGNOSIS — Z0000 Encounter for general adult medical examination without abnormal findings: Secondary | ICD-10-CM

## 2020-01-04 DIAGNOSIS — Z1211 Encounter for screening for malignant neoplasm of colon: Secondary | ICD-10-CM | POA: Diagnosis not present

## 2020-01-04 DIAGNOSIS — I10 Essential (primary) hypertension: Secondary | ICD-10-CM | POA: Diagnosis not present

## 2020-01-04 DIAGNOSIS — E559 Vitamin D deficiency, unspecified: Secondary | ICD-10-CM

## 2020-01-04 MED ORDER — GABAPENTIN 300 MG PO CAPS: 300.0000 mg | ORAL_CAPSULE | Freq: Every day | ORAL | 3 refills | Status: DC

## 2020-01-04 MED ORDER — SHINGRIX 50 MCG/0.5ML IM SUSR: 0.5000 mL | Freq: Once | INTRAMUSCULAR | 0 refills | Status: AC

## 2020-01-04 MED ORDER — TRIAMCINOLONE ACETONIDE 0.1 % EX CREA: 1.0000 "application " | TOPICAL_CREAM | Freq: Two times a day (BID) | CUTANEOUS | 0 refills | Status: DC

## 2020-01-04 NOTE — Assessment & Plan Note (Signed)
New onset over past months, has history of hysterectomy, but still has ovaries.  Will start Gabapentin 300 MG QHS, script sent, this may benefit hot flashes at night and sleep pattern.  Recommend she continue light layers at bedtime and using fan.  Return to office in 8 weeks for follow-up.

## 2020-01-04 NOTE — Assessment & Plan Note (Signed)
BMI 34.50.  Recommended eating smaller high protein, low fat meals more frequently and exercising 30 mins a day 5 times a week with a goal of 10-15lb weight loss in the next 3 months. Patient voiced their understanding and motivation to adhere to these recommendations.

## 2020-01-04 NOTE — Assessment & Plan Note (Signed)
Chronic, stable with Sertraline at 100 MG.  Continue this current dose and will adjust in future, possibly reduce if improvement in symptoms and stressors.  Denies SI/HI.   

## 2020-01-04 NOTE — Assessment & Plan Note (Signed)
Chronic, stable with BP at goal in office today. - Continue Lisinopril at 40 MG and Amlodipine 10 MG daily. - CMP and TSH today - Recommend she continue to monitor BP daily and document + focus on a low sodium (DASH) diet at home.   - Return in 6 months, refills sent in.

## 2020-01-04 NOTE — Progress Notes (Signed)
BP 124/83 (BP Location: Left Arm, Patient Position: Sitting, Cuff Size: Normal)   Pulse 84   Temp 98.3 F (36.8 C) (Oral)   Resp 16   Ht 5\' 4"  (1.626 m)   Wt 201 lb (91.2 kg)   LMP 02/18/2012   SpO2 97%   BMI 34.50 kg/m    Subjective:    Patient ID: Shannon Marsh, female    DOB: 1965-12-21, 54 y.o.   MRN: [id number]  HPI: Shannon Marsh is a 54 y.o. female presenting on 01/04/2020 for comprehensive medical examination. Current medical complaints include:none  She currently lives with: husband Menopausal Symptoms: yes  HYPERTENSION Continues on Lisinopril 40 MG and Amlodipine to 10 MG daily.  Does have history of elevation in LDL in March 2021 -- 140.  Continues to take daily Omeprazole for GERD with benefit. Hypertension status: stable  Satisfied with current treatment? yes Duration of hypertension: chronic BP monitoring frequency:  occasional BP range: 120/80 range BP medication side effects:  no Medication compliance: good compliance Aspirin: no Recurrent headaches: no Visual changes: no Palpitations: no Dyspnea: no Chest pain: no Lower extremity edema: no Dizzy/lightheaded: no  The 10-year ASCVD risk score Mikey Bussing DC Jr., et al., 2013) is: 2.2%   Values used to calculate the score:     Age: 37 years     Sex: Female     Is Non-Hispanic African American: No     Diabetic: No     Tobacco smoker: No     Systolic Blood Pressure: 810 mmHg     Is BP treated: Yes     HDL Cholesterol: 63 mg/dL     Total Cholesterol: 223 mg/dL   DEPRESSION Continues on Sertraline 100 MG daily.  She reports this works well for mood, but is struggling with hot flashes at night -- waking her up on occasion.  Has the air conditioner and fan on at night + light layers, but this does not help.  Will be covered in sweat on occasion. Mood status: stable Satisfied with current treatment?: yes Symptom severity: moderate  Duration of current treatment : chronic Side effects:  no Medication compliance: good compliance Psychotherapy/counseling: none Depressed mood: no Anxious mood: no Anhedonia: no Significant weight loss or gain: no Insomnia: none -- takes Melatonin Fatigue: no Feelings of worthlessness or guilt: no Impaired concentration/indecisiveness: no Suicidal ideations: no Hopelessness: no Crying spells: no Depression screen [facility] 2/9 01/04/2020 05/07/2019 03/09/2019 06/17/2018 05/27/2018  Decreased Interest 0 0 0 1 0  Down, Depressed, Hopeless 0 1 0 1 0  PHQ - 2 Score 0 1 0 2 0  Altered sleeping 0 1 1 2 1   Tired, decreased energy 0 1 0 2 1  Change in appetite 0 1 1 2 2   Feeling bad or failure about yourself  1 0 0 2 1  Trouble concentrating 0 1 1 1  0  Moving slowly or fidgety/restless 0 0 0 0 0  Suicidal thoughts 0 0 0 0 0  PHQ-9 Score 1 5 3 11 5   Difficult doing work/chores Not difficult at all Not difficult at all Not difficult at all Somewhat difficult Not difficult at all   The patient does not have a history of falls. I did not complete a risk assessment for falls. A plan of care for falls was not documented.   Past Medical History:  Past Medical History:  Diagnosis Date  . Anemia    prior to hysterectomy  . Bunion, right foot   .  Gall stones   . GERD (gastroesophageal reflux disease)    with spicy foods  . Hx gestational diabetes   . Hypertension   . Varicose vein of leg     Surgical History:  Past Surgical History:  Procedure Laterality Date  . 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 BILATERAL SALPINGECTOMY  03/23/2012   Procedure: BILATERAL SALPINGECTOMY;  Surgeon: Anastasio Auerbach, MD;  Location: [facility];  Service: Gynecology;  Laterality: Bilateral;  . BREAST CYST ASPIRATION Left    neg  . BUNIONECTOMY Left 03/25/2018   Procedure: LAPIDUS -  TYPE LEFT;  Surgeon: Samara Deist, DPM;  Location: [facility];  Service: Podiatry;  Laterality: Left;  LAPIPLASTY SET GENERAL WITH LOCAL  . BUNIONECTOMY Left   . BUNIONECTOMY Right 02/10/2019   Procedure: LAPIDUS TYPE RIGHT;  Surgeon: Samara Deist, DPM;  Location: [facility];  Service: Podiatry;  Laterality: Right;  general with local LAPIPLASTY SET  . CHOLECYSTECTOMY  03/09/14    Medications:  Current Outpatient Medications on File Prior to Visit  Medication Sig  . Acetaminophen (TYLENOL 8 HOUR PO) Take by mouth as needed.  Marland Kitchen amLODipine (NORVASC) 10 MG tablet Take 1 tablet (10 mg total) by mouth daily.  Marland Kitchen lisinopril (ZESTRIL) 40 MG tablet Take 1 tablet (40 mg total) by mouth daily.  . Melatonin 1 MG TABS Take 1 mg by mouth as needed.  . Multiple Vitamin (MULTIVITAMIN) tablet Take 1 tablet by mouth daily. Occasionally a chewable  . omeprazole (PRILOSEC) 20 MG capsule Take 20 mg by mouth daily.  . sertraline (ZOLOFT) 100 MG tablet Take 1 tablet (100 mg total) by mouth daily.  . fluticasone (FLONASE) 50 MCG/ACT nasal spray Place 2 sprays into both nostrils daily. (Patient not taking: Reported on 01/04/2020)  . [DISCONTINUED] sertraline (ZOLOFT) 50 MG tablet Take 1 tablet (50 mg total) by mouth daily.   No current facility-administered medications on file prior to visit.    Allergies:  No Known Allergies  Social History:  Social History   Socioeconomic History  . Marital status: Married    Spouse name: Not on file  . Number of children: Not on file  . Years of education: Not on file  . Highest education level: Not on file  Occupational History  . Not on file  Tobacco Use  . Smoking status: Never Smoker  . Smokeless tobacco: Never Used  Vaping Use  . Vaping Use: Never used  Substance and Sexual Activity  . Alcohol use: Yes    Alcohol/week: 3.0 standard drinks    Types: 3 Standard drinks or equivalent per week  . Drug use: No  . Sexual activity: Yes     Partners: Male    Birth control/protection: Surgical    Comment: 1st intercourse 54 yo-Fewer than 5 partners  Other Topics Concern  . Not on file  Social History Narrative  . Not on file   Social Determinants of Health   Financial Resource Strain:   . Difficulty of Paying Living Expenses: Not on file  Food Insecurity:   . Worried About Charity fundraiser in the Last Year: Not on file  . Ran Out of Food in the Last Year: Not on file  Transportation Needs:   . Lack of Transportation (Medical): Not on file  . Lack of Transportation (Non-Medical): Not on file  Physical Activity:   . Days of Exercise per Week: Not on file  . Minutes of Exercise per Session: Not on file  Stress:   . Feeling of Stress : Not on file  Social Connections:   . Frequency of Communication with Friends and Family: Not on file  . Frequency of Social Gatherings with Friends and Family: Not on file  . Attends Religious Services: Not on file  . Active Member of Clubs or Organizations: Not on file  . Attends Archivist Meetings: Not on file  . Marital Status: Not on file  Intimate Partner Violence:   . Fear of Current or Ex-Partner: Not on file  . Emotionally Abused: Not on file  . Physically Abused: Not on file  . Sexually Abused: Not on file   Social History   Tobacco Use  Smoking Status Never Smoker  Smokeless Tobacco Never Used   Social History   Substance and Sexual Activity  Alcohol Use Yes  . Alcohol/week: 3.0 standard drinks  . Types: 3 Standard drinks or equivalent per week    Family History:  Family History  Problem Relation Age of Onset  . Diabetes Mother   . Hypertension Mother   . Dementia Mother   . Diabetes Father   . Heart disease Father   . Hypertension Brother   . Hypertension Brother   . Hypertension Brother   . Stroke Brother        mild  . Hypertension Sister   . Bullous pemphigoid Sister   . Cancer Maternal Grandfather        lung  . Diabetes Brother    . Other Neg Hx   . Breast cancer Neg Hx     Past medical history, surgical history, medications, allergies, family history and social history reviewed with patient today and changes made to appropriate areas of the chart.   Review of Systems - negative All other ROS negative except what is listed above and in the HPI.      Objective:    BP 124/83 (BP Location: Left Arm, Patient Position: Sitting, Cuff Size: Normal)   Pulse 84   Temp 98.3 F (36.8 C) (Oral)   Resp 16   Ht 5\' 4"  (1.626 m)   Wt 201 lb (91.2 kg)   LMP 02/18/2012   SpO2 97%   BMI 34.50 kg/m   Wt Readings from Last 3 Encounters:  01/04/20 201 lb (91.2 kg)  11/10/19 195 lb (88.5 kg)  06/09/19 204 lb 9.6 oz (92.8 kg)    Physical Exam Vitals and nursing note reviewed.  Constitutional:      General: She is awake. She is not in acute distress.    Appearance: She is well-developed and well-groomed. She is obese. She is not ill-appearing.  HENT:     Head: Normocephalic and atraumatic.     Right Ear: Hearing, tympanic membrane, ear canal and external ear normal. No drainage.     Left Ear: Hearing, tympanic membrane, ear canal and external ear normal. No drainage.     Nose: Nose normal.     Right Sinus: No maxillary sinus tenderness or frontal sinus tenderness.     Left Sinus: No maxillary sinus tenderness or frontal sinus tenderness.     Mouth/Throat:     Mouth: Mucous membranes are moist.     Pharynx: Oropharynx is clear. Uvula midline. No pharyngeal swelling, oropharyngeal exudate or posterior oropharyngeal erythema.  Eyes:     General: Lids are normal.  Right eye: No discharge.        Left eye: No discharge.     Extraocular Movements: Extraocular movements intact.     Conjunctiva/sclera: Conjunctivae normal.     Pupils: Pupils are equal, round, and reactive to light.     Visual Fields: Right eye visual fields normal and left eye visual fields normal.  Neck:     Thyroid: No thyromegaly.     Vascular:  No carotid bruit.     Trachea: Trachea normal.  Cardiovascular:     Rate and Rhythm: Normal rate and regular rhythm.     Heart sounds: Normal heart sounds. No murmur heard.  No gallop.   Pulmonary:     Effort: Pulmonary effort is normal. No accessory muscle usage or respiratory distress.     Breath sounds: Normal breath sounds.  Chest:     Breasts:        Right: Normal.        Left: Normal.  Abdominal:     General: Bowel sounds are normal.     Palpations: Abdomen is soft. There is no hepatomegaly or splenomegaly.     Tenderness: There is no abdominal tenderness.  Musculoskeletal:        General: Normal range of motion.     Cervical back: Normal range of motion and neck supple.     Right lower leg: No edema.     Left lower leg: No edema.  Lymphadenopathy:     Head:     Right side of head: No submental, submandibular, tonsillar, preauricular or posterior auricular adenopathy.     Left side of head: No submental, submandibular, tonsillar, preauricular or posterior auricular adenopathy.     Cervical: No cervical adenopathy.     Upper Body:     Right upper body: No supraclavicular, axillary or pectoral adenopathy.     Left upper body: No supraclavicular, axillary or pectoral adenopathy.  Skin:    General: Skin is warm and dry.     Capillary Refill: Capillary refill takes less than 2 seconds.     Findings: Rash present. Rash is macular.     Comments: Scattered macular rash to bilateral shins, no vesicles, skin intact.  Neurological:     Mental Status: She is alert and oriented to person, place, and time.     Cranial Nerves: Cranial nerves are intact.     Gait: Gait is intact.     Deep Tendon Reflexes: Reflexes are normal and symmetric.     Reflex Scores:      Brachioradialis reflexes are 2+ on the right side and 2+ on the left side.      Patellar reflexes are 2+ on the right side and 2+ on the left side. Psychiatric:        Attention and Perception: Attention normal.         Mood and Affect: Mood normal.        Speech: Speech normal.        Behavior: Behavior normal. Behavior is cooperative.        Thought Content: Thought content normal.        Judgment: Judgment normal.     Results for orders placed or performed in visit on 11/10/19  Novel Coronavirus, NAA ([facility])   Specimen: Nasopharyngeal(NP) swabs in vial transport medium   Nasopharynge  Screenin  Result Value Ref Range   SARS-CoV-2, NAA Not Detected Not Detected      Assessment & Plan:   Problem List Items  Addressed This Visit      Cardiovascular and Mediastinum   Benign hypertension    Chronic, stable with BP at goal in office today. - Continue Lisinopril at 40 MG and Amlodipine 10 MG daily. - CMP and TSH today - Recommend she continue to monitor BP daily and document + focus on a low sodium (DASH) diet at home.   - Return in 6 months, refills sent in.      Relevant Orders   Comprehensive metabolic panel   Lipid Panel w/o Chol/HDL Ratio   TSH   Hot flashes due to menopause    New onset over past months, has history of hysterectomy, but still has ovaries.  Will start Gabapentin 300 MG QHS, script sent, this may benefit hot flashes at night and sleep pattern.  Recommend she continue light layers at bedtime and using fan.  Return to office in 8 weeks for follow-up.        Other   Obesity (BMI 30.0-34.9)    BMI 34.50.  Recommended eating smaller high protein, low fat meals more frequently and exercising 30 mins a day 5 times a week with a goal of 10-15lb weight loss in the next 3 months. Patient voiced their understanding and motivation to adhere to these recommendations.       Caregiver role strain    Chronic, stable with Sertraline at 100 MG.  Continue this current dose and will adjust in future, possibly reduce if improvement in symptoms and stressors.  Denies SI/HI.        Elevated LDL cholesterol level    Check lipid panel today and recommend heavy focus on diet changes and  weight loss.      Relevant Orders   Lipid Panel w/o Chol/HDL Ratio    Other Visit Diagnoses    Encounter for annual physical exam    -  Primary   Relevant Orders   CBC with Differential/Platelet   Dermatitis       Bilateral lower legs, script for triamcinolone cream sent.   Vitamin D deficiency       History of low levels reported, check today and initiate supplement as needed.   Relevant Orders   VITAMIN D 25 Hydroxy (Vit-D Deficiency, Fractures)   Encounter for screening for HIV       HIV screening on labs today   Relevant Orders   HIV Antibody (routine testing w rflx)   Need for hepatitis C screening test       Hep C screening on labs today   Relevant Orders   Hepatitis C antibody   Colon cancer screening       Cologuard ordered.   Relevant Orders   Cologuard       Follow up plan: Return in about 8 weeks (around 02/29/2020) for Menopause.   LABORATORY TESTING:  - Pap smear: not applicable  IMMUNIZATIONS:   - Tdap: Tetanus vaccination status reviewed: last tetanus booster within 10 years. - Influenza: wishes to wait - Pneumovax: Not applicable - Prevnar: Not applicable - HPV: Not applicable - Zostavax vaccine: ordered  SCREENING: -Mammogram: Ordered today  - Colonoscopy: Cologuard ordered - Bone Density: Not applicable  -Hearing Test: Not applicable  -Spirometry: Not applicable   PATIENT COUNSELING:   Advised to take 1 mg of folate supplement per day if capable of pregnancy.   Sexuality: Discussed sexually transmitted diseases, partner selection, use of condoms, avoidance of unintended pregnancy  and contraceptive alternatives.   Advised to avoid cigarette smoking.  I discussed with the patient that most people either abstain from alcohol or drink within safe limits (<=14/week and <=4 drinks/occasion for males, <=7/weeks and <= 3 drinks/occasion for females) and that the risk for alcohol disorders and other health effects rises proportionally with the  number of drinks per week and how often a drinker exceeds daily limits.  Discussed cessation/primary prevention of drug use and availability of treatment for abuse.   Diet: Encouraged to adjust caloric intake to maintain  or achieve ideal body weight, to reduce intake of dietary saturated fat and total fat, to limit sodium intake by avoiding high sodium foods and not adding table salt, and to maintain adequate dietary potassium and calcium preferably from fresh fruits, vegetables, and low-fat dairy products.    Stressed the importance of regular exercise  Injury prevention: Discussed safety belts, safety helmets, smoke detector, smoking near bedding or upholstery.   Dental health: Discussed importance of regular tooth brushing, flossing, and dental visits.    NEXT PREVENTATIVE PHYSICAL DUE IN 1 YEAR. Return in about 8 weeks (around 02/29/2020) for Menopause.

## 2020-01-04 NOTE — Patient Instructions (Signed)
DASH Eating Plan DASH stands for "Dietary Approaches to Stop Hypertension." The DASH eating plan is a healthy eating plan that has been shown to reduce high blood pressure (hypertension). It may also reduce your risk for type 2 diabetes, heart disease, and stroke. The DASH eating plan may also help with weight loss. What are tips for following this plan?  General guidelines  Avoid eating more than 2,300 mg (milligrams) of salt (sodium) a day. If you have hypertension, you may need to reduce your sodium intake to 1,500 mg a day.  Limit alcohol intake to no more than 1 drink a day for nonpregnant women and 2 drinks a day for men. One drink equals 12 oz of beer, 5 oz of wine, or 1 oz of hard liquor.  Work with your health care provider to maintain a healthy body weight or to lose weight. Ask what an ideal weight is for you.  Get at least 30 minutes of exercise that causes your heart to beat faster (aerobic exercise) most days of the week. Activities may include walking, swimming, or biking.  Work with your health care provider or diet and nutrition specialist (dietitian) to adjust your eating plan to your individual calorie needs. Reading food labels   Check food labels for the amount of sodium per serving. Choose foods with less than 5 percent of the Daily Value of sodium. Generally, foods with less than 300 mg of sodium per serving fit into this eating plan.  To find whole grains, look for the word "whole" as the first word in the ingredient list. Shopping  Buy products labeled as "low-sodium" or "no salt added."  Buy fresh foods. Avoid canned foods and premade or frozen meals. Cooking  Avoid adding salt when cooking. Use salt-free seasonings or herbs instead of table salt or sea salt. Check with your health care provider or pharmacist before using salt substitutes.  Do not fry foods. Cook foods using healthy methods such as baking, boiling, grilling, and broiling instead.  Cook with  heart-healthy oils, such as olive, canola, soybean, or sunflower oil. Meal planning  Eat a balanced diet that includes: ? 5 or more servings of fruits and vegetables each day. At each meal, try to fill half of your plate with fruits and vegetables. ? Up to 6-8 servings of whole grains each day. ? Less than 6 oz of lean meat, poultry, or fish each day. A 3-oz serving of meat is about the same size as a deck of cards. One egg equals 1 oz. ? 2 servings of low-fat dairy each day. ? A serving of nuts, seeds, or beans 5 times each week. ? Heart-healthy fats. Healthy fats called Omega-3 fatty acids are found in foods such as flaxseeds and coldwater fish, like sardines, salmon, and mackerel.  Limit how much you eat of the following: ? Canned or prepackaged foods. ? Food that is high in trans fat, such as fried foods. ? Food that is high in saturated fat, such as fatty meat. ? Sweets, desserts, sugary drinks, and other foods with added sugar. ? Full-fat dairy products.  Do not salt foods before eating.  Try to eat at least 2 vegetarian meals each week.  Eat more home-cooked food and less restaurant, buffet, and fast food.  When eating at a restaurant, ask that your food be prepared with less salt or no salt, if possible. What foods are recommended? The items listed may not be a complete list. Talk with your dietitian about   what dietary choices are best for you. Grains Whole-grain or whole-wheat bread. Whole-grain or whole-wheat pasta. Brown rice. Oatmeal. Quinoa. Bulgur. Whole-grain and low-sodium cereals. Pita bread. Low-fat, low-sodium crackers. Whole-wheat flour tortillas. Vegetables Fresh or frozen vegetables (raw, steamed, roasted, or grilled). Low-sodium or reduced-sodium tomato and vegetable juice. Low-sodium or reduced-sodium tomato sauce and tomato paste. Low-sodium or reduced-sodium canned vegetables. Fruits All fresh, dried, or frozen fruit. Canned fruit in natural juice (without  added sugar). Meat and other protein foods Skinless chicken or turkey. Ground chicken or turkey. Pork with fat trimmed off. Fish and seafood. Egg whites. Dried beans, peas, or lentils. Unsalted nuts, nut butters, and seeds. Unsalted canned beans. Lean cuts of beef with fat trimmed off. Low-sodium, lean deli meat. Dairy Low-fat (1%) or fat-free (skim) milk. Fat-free, low-fat, or reduced-fat cheeses. Nonfat, low-sodium ricotta or cottage cheese. Low-fat or nonfat yogurt. Low-fat, low-sodium cheese. Fats and oils Soft margarine without trans fats. Vegetable oil. Low-fat, reduced-fat, or light mayonnaise and salad dressings (reduced-sodium). Canola, safflower, olive, soybean, and sunflower oils. Avocado. Seasoning and other foods Herbs. Spices. Seasoning mixes without salt. Unsalted popcorn and pretzels. Fat-free sweets. What foods are not recommended? The items listed may not be a complete list. Talk with your dietitian about what dietary choices are best for you. Grains Baked goods made with fat, such as croissants, muffins, or some breads. Dry pasta or rice meal packs. Vegetables Creamed or fried vegetables. Vegetables in a cheese sauce. Regular canned vegetables (not low-sodium or reduced-sodium). Regular canned tomato sauce and paste (not low-sodium or reduced-sodium). Regular tomato and vegetable juice (not low-sodium or reduced-sodium). Pickles. Olives. Fruits Canned fruit in a light or heavy syrup. Fried fruit. Fruit in cream or butter sauce. Meat and other protein foods Fatty cuts of meat. Ribs. Fried meat. Bacon. Sausage. Bologna and other processed lunch meats. Salami. Fatback. Hotdogs. Bratwurst. Salted nuts and seeds. Canned beans with added salt. Canned or smoked fish. Whole eggs or egg yolks. Chicken or turkey with skin. Dairy Whole or 2% milk, cream, and half-and-half. Whole or full-fat cream cheese. Whole-fat or sweetened yogurt. Full-fat cheese. Nondairy creamers. Whipped toppings.  Processed cheese and cheese spreads. Fats and oils Butter. Stick margarine. Lard. Shortening. Ghee. Bacon fat. Tropical oils, such as coconut, palm kernel, or palm oil. Seasoning and other foods Salted popcorn and pretzels. Onion salt, garlic salt, seasoned salt, table salt, and sea salt. Worcestershire sauce. Tartar sauce. Barbecue sauce. Teriyaki sauce. Soy sauce, including reduced-sodium. Steak sauce. Canned and packaged gravies. Fish sauce. Oyster sauce. Cocktail sauce. Horseradish that you find on the shelf. Ketchup. Mustard. Meat flavorings and tenderizers. Bouillon cubes. Hot sauce and Tabasco sauce. Premade or packaged marinades. Premade or packaged taco seasonings. Relishes. Regular salad dressings. Where to find more information:  National Heart, Lung, and Blood Institute: www.nhlbi.nih.gov  American Heart Association: www.heart.org Summary  The DASH eating plan is a healthy eating plan that has been shown to reduce high blood pressure (hypertension). It may also reduce your risk for type 2 diabetes, heart disease, and stroke.  With the DASH eating plan, you should limit salt (sodium) intake to 2,300 mg a day. If you have hypertension, you may need to reduce your sodium intake to 1,500 mg a day.  When on the DASH eating plan, aim to eat more fresh fruits and vegetables, whole grains, lean proteins, low-fat dairy, and heart-healthy fats.  Work with your health care provider or diet and nutrition specialist (dietitian) to adjust your eating plan to your   individual calorie needs. This information is not intended to replace advice given to you by your health care provider. Make sure you discuss any questions you have with your health care provider. Document Revised: 02/07/2017 Document Reviewed: 02/19/2016 Elsevier Patient Education  2020 [employer]  

## 2020-01-04 NOTE — Assessment & Plan Note (Signed)
Check lipid panel today and recommend heavy focus on diet changes and weight loss.

## 2020-01-05 ENCOUNTER — Encounter: Payer: Self-pay | Admitting: Nurse Practitioner

## 2020-01-05 DIAGNOSIS — E559 Vitamin D deficiency, unspecified: Secondary | ICD-10-CM | POA: Insufficient documentation

## 2020-01-05 LAB — COMPREHENSIVE METABOLIC PANEL
ALT: 10 IU/L (ref 0–32)
AST: 13 IU/L (ref 0–40)
Albumin/Globulin Ratio: 1.9 (ref 1.2–2.2)
Albumin: 4.5 g/dL (ref 3.8–4.9)
Alkaline Phosphatase: 113 IU/L (ref 44–121)
BUN/Creatinine Ratio: 32 — ABNORMAL HIGH (ref 9–23)
BUN: 21 mg/dL (ref 6–24)
Bilirubin Total: 0.4 mg/dL (ref 0.0–1.2)
CO2: 24 mmol/L (ref 20–29)
Calcium: 9.5 mg/dL (ref 8.7–10.2)
Chloride: 103 mmol/L (ref 96–106)
Creatinine, Ser: 0.65 mg/dL (ref 0.57–1.00)
GFR calc Af Amer: 116 mL/min/{1.73_m2} (ref >59)
GFR calc non Af Amer: 101 mL/min/{1.73_m2} (ref >59)
Globulin, Total: 2.4 g/dL (ref 1.5–4.5)
Glucose: 100 mg/dL — ABNORMAL HIGH (ref 65–99)
Potassium: 4.4 mmol/L (ref 3.5–5.2)
Sodium: 140 mmol/L (ref 134–144)
Total Protein: 6.9 g/dL (ref 6.0–8.5)

## 2020-01-05 LAB — CBC WITH DIFFERENTIAL/PLATELET
Basophils Absolute: 0.1 10*3/uL (ref 0.0–0.2)
Basos: 1 %
EOS (ABSOLUTE): 0.2 10*3/uL (ref 0.0–0.4)
Eos: 4 %
Hematocrit: 41.5 % (ref 34.0–46.6)
Hemoglobin: 13.8 g/dL (ref 11.1–15.9)
Immature Grans (Abs): 0.1 10*3/uL (ref 0.0–0.1)
Immature Granulocytes: 1 %
Lymphocytes Absolute: 1.6 10*3/uL (ref 0.7–3.1)
Lymphs: 23 %
MCH: 29.8 pg (ref 26.6–33.0)
MCHC: 33.3 g/dL (ref 31.5–35.7)
MCV: 90 fL (ref 79–97)
Monocytes Absolute: 0.4 10*3/uL (ref 0.1–0.9)
Monocytes: 6 %
Neutrophils Absolute: 4.5 10*3/uL (ref 1.4–7.0)
Neutrophils: 65 %
Platelets: 330 10*3/uL (ref 150–450)
RBC: 4.63 x10E6/uL (ref 3.77–5.28)
RDW: 12.9 % (ref 11.7–15.4)
WBC: 6.9 10*3/uL (ref 3.4–10.8)

## 2020-01-05 LAB — HEPATITIS C ANTIBODY: Hep C Virus Ab: <0.1 s/co ratio (ref 0.0–0.9)

## 2020-01-05 LAB — LIPID PANEL W/O CHOL/HDL RATIO
Cholesterol, Total: 240 mg/dL — ABNORMAL HIGH (ref 100–199)
HDL: 70 mg/dL (ref >39)
LDL Chol Calc (NIH): 152 mg/dL — ABNORMAL HIGH (ref 0–99)
Triglycerides: 102 mg/dL (ref 0–149)
VLDL Cholesterol Cal: 18 mg/dL (ref 5–40)

## 2020-01-05 LAB — TSH: TSH: 1.6 u[IU]/mL (ref 0.450–4.500)

## 2020-01-05 LAB — VITAMIN D 25 HYDROXY (VIT D DEFICIENCY, FRACTURES): Vit D, 25-Hydroxy: 23.9 ng/mL — ABNORMAL LOW (ref 30.0–100.0)

## 2020-01-05 LAB — HIV ANTIBODY (ROUTINE TESTING W REFLEX): HIV Screen 4th Generation wRfx: NONREACTIVE

## 2020-01-05 NOTE — Progress Notes (Signed)
Contacted via MyChart The 10-year ASCVD risk score Mikey Bussing DC Jr., et al., 2013) is: 2.2%   Values used to calculate the score:     Age: 54 years     Sex: Female     Is Non-Hispanic African American: No     Diabetic: No     Tobacco smoker: No     Systolic Blood Pressure: 557 mmHg     Is BP treated: Yes     HDL Cholesterol: 70 mg/dL     Total Cholesterol: 240 mg/dL  Good morning Shannon Marsh, your labs have returned.  Overall everything looks good with exception of low Vitamin D level, please start taking Vitamin D3 1000 units daily, this is great for bone and muscle health.  Your cholesterol levels also show elevation. Your cholesterol is still high, but continued recommendations to make lifestyle changes. Your LDL is above normal. The LDL is the bad cholesterol. Over time and in combination with inflammation and other factors, this contributes to plaque which in turn may lead to stroke and/or heart attack down the road. Sometimes high LDL is primarily genetic, and people might be eating all the right foods but still have high numbers. Other times, there is room for improvement in one's diet and eating healthier can bring this number down and potentially reduce one's risk of heart attack and/or stroke.   To reduce your LDL, Remember - more fruits and vegetables, more fish, and limit red meat and dairy products. More soy, nuts, beans, barley, lentils, oats and plant sterol ester enriched margarine instead of butter. I also encourage eliminating sugar and processed food. Remember, shop on the outside of the grocery store and visit your [employer]. If you would like to talk with me about dietary changes for your cholesterol, please let me know. We should recheck your cholesterol in 12 months.  Any questions? Keep being awesome!!  Thank you for allowing me to participate in your care. Kindest regards, Senna Lape

## 2020-01-20 ENCOUNTER — Other Ambulatory Visit: Payer: Self-pay | Admitting: Nurse Practitioner

## 2020-01-20 MED ORDER — TRIAMCINOLONE ACETONIDE 0.1 % EX CREA: 1.0000 "application " | TOPICAL_CREAM | Freq: Two times a day (BID) | CUTANEOUS | 2 refills | Status: DC

## 2020-02-29 ENCOUNTER — Encounter: Payer: Self-pay | Admitting: Nurse Practitioner

## 2020-02-29 ENCOUNTER — Other Ambulatory Visit: Payer: Self-pay

## 2020-02-29 ENCOUNTER — Ambulatory Visit (INDEPENDENT_AMBULATORY_CARE_PROVIDER_SITE_OTHER): Payer: Managed Care, Other (non HMO) | Admitting: Nurse Practitioner

## 2020-02-29 DIAGNOSIS — N951 Menopausal and female climacteric states: Secondary | ICD-10-CM | POA: Diagnosis not present

## 2020-02-29 NOTE — Assessment & Plan Note (Signed)
Stable and improved with Gabapentin, has history of hysterectomy, but still has ovaries.  Cotninue Gabapentin 300 MG QHS is offering benefit to hot flashes at night and sleep pattern.  Recommend she continue light layers at bedtime and using fan.  Return to office in 6 months for chronic disease visit.

## 2020-02-29 NOTE — Patient Instructions (Signed)
Menopause Menopause is the normal time of life when menstrual periods stop completely. It is usually confirmed by 12 months without a menstrual period. The transition to menopause (perimenopause) most often happens between the ages of 45 and 55. During perimenopause, hormone levels change in your body, which can cause symptoms and affect your health. Menopause may increase your risk for:  Loss of bone (osteoporosis), which causes bone breaks (fractures).  Depression.  Hardening and narrowing of the arteries (atherosclerosis), which can cause heart attacks and strokes. What are the causes? This condition is usually caused by a natural change in hormone levels that happens as you get older. The condition may also be caused by surgery to remove both ovaries (bilateral oophorectomy). What increases the risk? This condition is more likely to start at an earlier age if you have certain medical conditions or treatments, including:  A tumor of the pituitary gland in the brain.  A disease that affects the ovaries and hormone production.  Radiation treatment for cancer.  Certain cancer treatments, such as chemotherapy or hormone (anti-estrogen) therapy.  Heavy smoking and excessive alcohol use.  Family history of early menopause. This condition is also more likely to develop earlier in women who are very thin. What are the signs or symptoms? Symptoms of this condition include:  Hot flashes.  Irregular menstrual periods.  Night sweats.  Changes in feelings about sex. This could be a decrease in sex drive or an increased comfort around your sexuality.  Vaginal dryness and thinning of the vaginal walls. This may cause painful intercourse.  Dryness of the skin and development of wrinkles.  Headaches.  Problems sleeping (insomnia).  Mood swings or irritability.  Memory problems.  Weight gain.  Hair growth on the face and chest.  Bladder infections or problems with urinating. How  is this diagnosed? This condition is diagnosed based on your medical history, a physical exam, your age, your menstrual history, and your symptoms. Hormone tests may also be done. How is this treated? In some cases, no treatment is needed. You and your health care provider should make a decision together about whether treatment is necessary. Treatment will be based on your individual condition and preferences. Treatment for this condition focuses on managing symptoms. Treatment may include:  Menopausal hormone therapy (MHT).  Medicines to treat specific symptoms or complications.  Acupuncture.  Vitamin or herbal supplements. Before starting treatment, make sure to let your health care provider know if you have a personal or family history of:  Heart disease.  Breast cancer.  Blood clots.  Diabetes.  Osteoporosis. Follow these instructions at home: Lifestyle  Do not use any products that contain nicotine or tobacco, such as cigarettes and e-cigarettes. If you need help quitting, ask your health care provider.  Get at least 30 minutes of physical activity on 5 or more days each week.  Avoid alcoholic and caffeinated beverages, as well as spicy foods. This may help prevent hot flashes.  Get 7-8 hours of sleep each night.  If you have hot flashes, try: ? Dressing in layers. ? Avoiding things that may trigger hot flashes, such as spicy food, warm places, or stress. ? Taking slow, deep breaths when a hot flash starts. ? Keeping a fan in your home and office.  Find ways to manage stress, such as deep breathing, meditation, or journaling.  Consider going to group therapy with other women who are having menopause symptoms. Ask your health care provider about recommended group therapy meetings. Eating and   drinking  Eat a healthy, balanced diet that contains whole grains, lean protein, low-fat dairy, and plenty of fruits and vegetables.  Your health care provider may recommend  adding more soy to your diet. Foods that contain soy include tofu, tempeh, and soy milk.  Eat plenty of foods that contain calcium and vitamin D for bone health. Items that are rich in calcium include low-fat milk, yogurt, beans, almonds, sardines, broccoli, and kale. Medicines  Take over-the-counter and prescription medicines only as told by your health care provider.  Talk with your health care provider before starting any herbal supplements. If prescribed, take vitamins and supplements as told by your health care provider. These may include: ? Calcium. Women age 51 and older should get 1,200 mg (milligrams) of calcium every day. ? Vitamin D. Women need 600-800 International Units of vitamin D each day. ? Vitamins B12 and B6. Aim for 50 micrograms of B12 and 1.5 mg of B6 each day. General instructions  Keep track of your menstrual periods, including: ? When they occur. ? How heavy they are and how long they last. ? How much time passes between periods.  Keep track of your symptoms, noting when they start, how often you have them, and how long they last.  Use vaginal lubricants or moisturizers to help with vaginal dryness and improve comfort during sex.  Keep all follow-up visits as told by your health care provider. This is important. This includes any group therapy or counseling. Contact a health care provider if:  You are still having menstrual periods after age 55.  You have pain during sex.  You have not had a period for 12 months and you develop vaginal bleeding. Get help right away if:  You have: ? Severe depression. ? Excessive vaginal bleeding. ? Pain when you urinate. ? A fast or irregular heart beat (palpitations). ? Severe headaches. ? Abdomen (abdominal) pain or severe indigestion.  You fell and you think you have a broken bone.  You develop leg or chest pain.  You develop vision problems.  You feel a lump in your breast. Summary  Menopause is the normal  time of life when menstrual periods stop completely. It is usually confirmed by 12 months without a menstrual period.  The transition to menopause (perimenopause) most often happens between the ages of 45 and 55.  Symptoms can be managed through medicines, lifestyle changes, and complementary therapies such as acupuncture.  Eat a balanced diet that is rich in nutrients to promote bone health and heart health and to manage symptoms during menopause. This information is not intended to replace advice given to you by your health care provider. Make sure you discuss any questions you have with your health care provider. Document Revised: 02/07/2017 Document Reviewed: 03/30/2016 Elsevier Patient Education  2020 [employer]  

## 2020-02-29 NOTE — Progress Notes (Signed)
BP 118/80   Pulse 86   Temp 98.1 F (36.7 C) (Oral)   Ht 5' 4.29" (1.633 m)   Wt 204 lb 12.8 oz (92.9 kg)   LMP 02/18/2012   SpO2 99%   BMI 34.84 kg/m    Subjective:    Patient ID: Shannon Marsh, female    DOB: [birth year], 54 y.o.   MRN: [id number]  HPI: Shannon Marsh is a 54 y.o. female  Chief Complaint  Patient presents with  . Menopause    Having night sweats, over heating, gabapentin seems to be helping   MENOPAUSAL SYMPTOMS Started on Gabapentin 300 MG QHS at last visit.  She is reporting benefit from this, had a period it did not work as well due to taking TUMS around the same time, but when stopped this the Gabapentin improved.  Is having better sleep pattern and less hot flashes. Gravida/Para: 2/2 Duration: stable Symptom severity: mild Hot flashes: yes Night sweats: yes Sleep disturbances: yes Vaginal dryness: no Dyspareunia:no Decreased libido: yes Emotional lability: yes Stress incontinence: no Previous HRT/pharmacotherapy: no Hysterectomy: yes -- has ovaries Absolute Contraindications to Hormonal Therapy:     Undiagnosed vaginal bleeding: no    Breast cancer: no    Endometrial cancer: no    Coronary disease: no    Cerebrovascular disease: no    Venous thromboembolic disease: no  Relevant past medical, surgical, family and social history reviewed and updated as indicated. Interim medical history since our last visit reviewed. Allergies and medications reviewed and updated.  Review of Systems  Constitutional: Negative for activity change, appetite change, diaphoresis, fatigue and fever.  Respiratory: Negative for cough, chest tightness and shortness of breath.   Cardiovascular: Negative for chest pain, palpitations and leg swelling.  Gastrointestinal: Negative.   Neurological: Negative.   Psychiatric/Behavioral: Negative.     Per HPI unless specifically indicated above     Objective:    BP 118/80   Pulse 86   Temp 98.1 F (36.7  C) (Oral)   Ht 5' 4.29" (1.633 m)   Wt 204 lb 12.8 oz (92.9 kg)   LMP 02/18/2012   SpO2 99%   BMI 34.84 kg/m   Wt Readings from Last 3 Encounters:  02/29/20 204 lb 12.8 oz (92.9 kg)  01/04/20 201 lb (91.2 kg)  11/10/19 195 lb (88.5 kg)    Physical Exam Vitals and nursing note reviewed.  Constitutional:      General: She is awake. She is not in acute distress.    Appearance: She is well-developed and well-groomed. She is obese. She is not ill-appearing.  HENT:     Head: Normocephalic.     Right Ear: Hearing, ear canal and external ear normal. No drainage.     Left Ear: Hearing, ear canal and external ear normal. No drainage.  Eyes:     General: Lids are normal.        Right eye: No discharge.        Left eye: No discharge.     Conjunctiva/sclera: Conjunctivae normal.     Pupils: Pupils are equal, round, and reactive to light.  Neck:     Vascular: No carotid bruit.  Cardiovascular:     Rate and Rhythm: Normal rate and regular rhythm.     Heart sounds: Normal heart sounds. No murmur heard. No gallop.   Pulmonary:     Effort: Pulmonary effort is normal. No accessory muscle usage.     Breath sounds: Normal breath sounds.  Abdominal:     General: Bowel sounds are normal.     Palpations: Abdomen is soft. There is no hepatomegaly or splenomegaly.  Musculoskeletal:     Cervical back: Normal range of motion and neck supple.     Right lower leg: No edema.     Left lower leg: No edema.  Skin:    General: Skin is warm and dry.  Neurological:     Mental Status: She is alert and oriented to person, place, and time.  Psychiatric:        Attention and Perception: Attention normal.        Mood and Affect: Mood normal.        Speech: Speech normal.        Behavior: Behavior normal. Behavior is cooperative.        Thought Content: Thought content normal.    Results for orders placed or performed in visit on 01/04/20  CBC with Differential/Platelet  Result Value Ref Range   WBC  6.9 3.4 - 10.8 x10E3/uL   RBC 4.63 3.77 - 5.28 x10E6/uL   Hemoglobin 13.8 11.1 - 15.9 g/dL   Hematocrit 41.5 34.0 - 46.6 %   MCV 90 79 - 97 fL   MCH 29.8 26.6 - 33.0 pg   MCHC 33.3 31.5 - 35.7 g/dL   RDW 12.9 11.7 - 15.4 %   Platelets 330 150 - 450 x10E3/uL   Neutrophils 65 Not Estab. %   Lymphs 23 Not Estab. %   Monocytes 6 Not Estab. %   Eos 4 Not Estab. %   Basos 1 Not Estab. %   Neutrophils Absolute 4.5 1.4 - 7.0 x10E3/uL   Lymphocytes Absolute 1.6 0.7 - 3.1 x10E3/uL   Monocytes Absolute 0.4 0.1 - 0.9 x10E3/uL   EOS (ABSOLUTE) 0.2 0.0 - 0.4 x10E3/uL   Basophils Absolute 0.1 0.0 - 0.2 x10E3/uL   Immature Granulocytes 1 Not Estab. %   Immature Grans (Abs) 0.1 0.0 - 0.1 x10E3/uL  Comprehensive metabolic panel  Result Value Ref Range   Glucose 100 (H) 65 - 99 mg/dL   BUN 21 6 - 24 mg/dL   Creatinine, Ser 0.65 0.57 - 1.00 mg/dL   GFR calc non Af Amer 101 >59 mL/min/1.73   GFR calc Af Amer 116 >59 mL/min/1.73   BUN/Creatinine Ratio 32 (H) 9 - 23   Sodium 140 134 - 144 mmol/L   Potassium 4.4 3.5 - 5.2 mmol/L   Chloride 103 96 - 106 mmol/L   CO2 24 20 - 29 mmol/L   Calcium 9.5 8.7 - 10.2 mg/dL   Total Protein 6.9 6.0 - 8.5 g/dL   Albumin 4.5 3.8 - 4.9 g/dL   Globulin, Total 2.4 1.5 - 4.5 g/dL   Albumin/Globulin Ratio 1.9 1.2 - 2.2   Bilirubin Total 0.4 0.0 - 1.2 mg/dL   Alkaline Phosphatase 113 44 - 121 IU/L   AST 13 0 - 40 IU/L   ALT 10 0 - 32 IU/L  Lipid Panel w/o Chol/HDL Ratio  Result Value Ref Range   Cholesterol, Total 240 (H) 100 - 199 mg/dL   Triglycerides 102 0 - 149 mg/dL   HDL 70 >39 mg/dL   VLDL Cholesterol Cal 18 5 - 40 mg/dL   LDL Chol Calc (NIH) 152 (H) 0 - 99 mg/dL  TSH  Result Value Ref Range   TSH 1.600 0.450 - 4.500 uIU/mL  VITAMIN D 25 Hydroxy (Vit-D Deficiency, Fractures)  Result Value Ref Range   Vit D,  25-Hydroxy 23.9 (L) 30.0 - 100.0 ng/mL  Hepatitis C antibody  Result Value Ref Range   Hep C Virus Ab <0.1 0.0 - 0.9 s/co ratio  HIV  Antibody (routine testing w rflx)  Result Value Ref Range   HIV Screen 4th Generation wRfx Non Reactive Non Reactive      Assessment & Plan:   Problem List Items Addressed This Visit      Cardiovascular and Mediastinum   Hot flashes due to menopause    Stable and improved with Gabapentin, has history of hysterectomy, but still has ovaries.  Cotninue Gabapentin 300 MG QHS is offering benefit to hot flashes at night and sleep pattern.  Recommend she continue light layers at bedtime and using fan.  Return to office in 6 months for chronic disease visit.          Follow up plan: Return in about 6 months (around 08/29/2020) for HTN AND MOOD.

## 2020-07-25 ENCOUNTER — Ambulatory Visit
Admission: EM | Admit: 2020-07-25 | Discharge: 2020-07-25 | Disposition: A | Discharge status: Home / Self Care | Payer: Managed Care, Other (non HMO) | Attending: Emergency Medicine | Admitting: Emergency Medicine

## 2020-07-25 ENCOUNTER — Other Ambulatory Visit: Payer: Self-pay

## 2020-07-25 DIAGNOSIS — S71112A Laceration without foreign body, left thigh, initial encounter: Secondary | ICD-10-CM | POA: Diagnosis not present

## 2020-07-25 NOTE — ED Triage Notes (Signed)
Pt presents with complaints of a small laceration to her left upper thigh. Reports she noticed it after she got out of the shower. Reports when she dried off with a towel she had blood start "shooting" out of her leg at the area. Bleeding is controlled at this time with a gauze.

## 2020-07-25 NOTE — Discharge Instructions (Signed)
Follow up with your primary care provider if your symptoms are not improving.     

## 2020-07-25 NOTE — ED Provider Notes (Signed)
Roderic Palau    CSN: [id number] Arrival date & time: 07/25/20  0818      History   Chief Complaint Chief Complaint  Patient presents with  . Laceration    HPI Shannon Marsh is a 55 y.o. female.   Patient presents with a laceration on her left lateral thigh since this morning.  She thinks she may have nicked it while shaving her legs.  The cut occurred over a varicose vein and was "shooting" blood.  Bleeding controlled with direct pressure.  Patient denies other symptoms.  She is not on anticoagulant.  Her medical history includes hypertension, anemia, GERD, varicose veins.  The history is provided by the patient and medical records.    Past Medical History:  Diagnosis Date  . Anemia    prior to hysterectomy  . Bunion, right foot   . Gall stones   . GERD (gastroesophageal reflux disease)    with spicy foods  . Hx gestational diabetes   . Hypertension   . Varicose vein of leg     Patient Active Problem List   Diagnosis Date Noted  . Vitamin D deficiency 01/05/2020  . Hot flashes due to menopause 01/04/2020  . Acute pain of right shoulder 06/09/2019  . Elevated LDL cholesterol level 06/09/2019  . Tendinitis 10/12/2018  . Caregiver role strain 05/27/2018  . Obesity (BMI 30.0-34.9) 03/19/2018  . Cyst of left breast 01/28/2018  . Benign hypertension 07/24/2017    Past Surgical History:  Procedure Laterality Date  . 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 BILATERAL SALPINGECTOMY  03/23/2012   Procedure: BILATERAL SALPINGECTOMY;  Surgeon: Anastasio Auerbach, MD;  Location: [facility];  Service: Gynecology;  Laterality: Bilateral;  . BREAST CYST ASPIRATION Left    neg  . BUNIONECTOMY Left 03/25/2018   Procedure: LAPIDUS - TYPE LEFT;  Surgeon: Samara Deist, DPM;  Location: [facility];  Service: Podiatry;  Laterality: Left;  LAPIPLASTY SET GENERAL WITH LOCAL  . BUNIONECTOMY Left   . BUNIONECTOMY Right 02/10/2019   Procedure: LAPIDUS TYPE RIGHT;  Surgeon: Samara Deist, DPM;  Location: [facility];  Service: Podiatry;  Laterality: Right;  general with local LAPIPLASTY SET  . CHOLECYSTECTOMY  03/09/14    OB History    Gravida  2   Para  2   Term  2   Preterm      AB      Living  2     SAB      IAB      Ectopic      Multiple      Live Births  1            Home Medications    Prior to Admission medications   Medication Sig Start Date End Date Taking? Authorizing Provider  Acetaminophen (TYLENOL 8 HOUR PO) Take by mouth as needed.    [provider]  amLODipine (NORVASC) 10 MG tablet Take 1 tablet (10 mg total) by mouth daily. 11/10/19   Cannady, Henrine Screws T, NP  fluticasone (FLONASE) 50 MCG/ACT nasal spray Place 2 sprays into both nostrils daily. 11/10/19   Cannady, Henrine Screws T, NP  gabapentin (NEURONTIN) 300 MG capsule Take 1 capsule (300 mg total) by mouth at bedtime. 01/04/20   Cannady, Henrine Screws T, NP  lisinopril (ZESTRIL) 40 MG tablet Take 1 tablet (  40 mg total) by mouth daily. 11/10/19   Cannady, Henrine Screws T, NP  Melatonin 1 MG TABS Take 1 mg by mouth as needed.    [provider]  Multiple Vitamin (MULTIVITAMIN) tablet Take 1 tablet by mouth daily. Occasionally a chewable    [provider]  omeprazole (PRILOSEC) 20 MG capsule Take 20 mg by mouth daily.    [provider]  sertraline (ZOLOFT) 100 MG tablet Take 1 tablet (100 mg total) by mouth daily. 11/10/19   Cannady, Henrine Screws T, NP  triamcinolone cream (KENALOG) 0.1 % Apply 1 application topically 2 (two) times daily. 01/20/20   Venita Lick, NP    Family History Family History  Problem Relation Age of Onset  . Diabetes Mother   . Hypertension Mother   . Dementia Mother   . Diabetes Father   . Heart disease Father   . Hypertension Brother    . Hypertension Brother   . Hypertension Brother   . Stroke Brother        mild  . Hypertension Sister   . Bullous pemphigoid Sister   . Cancer Maternal Grandfather        lung  . Diabetes Brother   . Other Neg Hx   . Breast cancer Neg Hx     Social History Social History   Tobacco Use  . Smoking status: Never Smoker  . Smokeless tobacco: Never Used  Vaping Use  . Vaping Use: Never used  Substance Use Topics  . Alcohol use: Yes    Alcohol/week: 3.0 standard drinks    Types: 3 Standard drinks or equivalent per week  . Drug use: No     Allergies   Patient has no known allergies.   Review of Systems Review of Systems  Constitutional: Negative for chills and fever.  Respiratory: Negative for cough and shortness of breath.   Cardiovascular: Negative for chest pain and palpitations.  Gastrointestinal: Negative for abdominal pain and vomiting.  Skin: Positive for wound. Negative for color change.  Neurological: Negative for weakness and numbness.  All other systems reviewed and are negative.    Physical Exam Triage Vital Signs ED Triage Vitals  Enc Vitals Group     BP      Pulse      Resp      Temp      Temp src      SpO2      Weight      Height      Head Circumference      Peak Flow      Pain Score      Pain Loc      Pain Edu?      Excl. in [place]?    No data found.  Updated Vital Signs BP 127/87   Pulse 72   Temp 98.1 F (36.7 C)   Resp 19   LMP 02/18/2012   SpO2 97%   Visual Acuity Right Eye Distance:   Left Eye Distance:   Bilateral Distance:    Right Eye Near:   Left Eye Near:    Bilateral Near:     Physical Exam Vitals and nursing note reviewed.  Constitutional:      General: She is not in acute distress.    Appearance: She is well-developed. She is not ill-appearing.  HENT:     Head: Normocephalic and atraumatic.  Eyes:     Conjunctiva/sclera: Conjunctivae normal.  Cardiovascular:     Rate and Rhythm: Normal  rate and regular  rhythm.     Heart sounds: Normal heart sounds.  Pulmonary:     Effort: Pulmonary effort is normal. No respiratory distress.     Breath sounds: Normal breath sounds.  Abdominal:     Palpations: Abdomen is soft.     Tenderness: There is no abdominal tenderness.  Musculoskeletal:        General: No swelling, tenderness or deformity. Normal range of motion.     Cervical back: Neck supple.  Skin:    General: Skin is warm and dry.     Capillary Refill: Capillary refill takes less than 2 seconds.     Findings: Lesion present.     Comments: 0.5 cm scabbed superficial laceration overlying superficial vein; no active bleeding.    Neurological:     General: No focal deficit present.     Mental Status: She is alert and oriented to person, place, and time.     Gait: Gait normal.  Psychiatric:        Mood and Affect: Mood normal.        Behavior: Behavior normal.      UC Treatments / Results  Labs (all labs ordered are listed, but only abnormal results are displayed) Labs Reviewed - No data to display  EKG   Radiology No results found.  Procedures Laceration Repair  Date/Time: 07/25/2020 8:57 AM Performed by: Sharion Balloon, NP Authorized by: Sharion Balloon, NP   Consent:    Consent obtained:  Verbal   Consent given by:  Patient   Risks discussed:  Infection, pain, poor cosmetic result and poor wound healing Universal protocol:    Procedure explained and questions answered to patient or proxy's satisfaction: yes   Anesthesia:    Anesthesia method:  None Laceration details:    Location:  Leg   Leg location:  L upper leg   Length (cm):  0.5   Depth (mm):  1 Exploration:    Hemostasis achieved with:  Direct pressure   Wound exploration: wound explored through full range of motion   Treatment:    Area cleansed with:  Shur-Clens Skin repair:    Repair method:  Tissue adhesive Repair type:    Repair type:  Simple Post-procedure details:    Dressing:  Open (no dressing)    Procedure completion:  Tolerated well, no immediate complications   (including critical care time)  Medications Ordered in UC Medications - No data to display  Initial Impression / Assessment and Plan / UC Course  I have reviewed the triage vital signs and the nursing notes.  Pertinent labs & imaging results that were available during my care of the patient were reviewed by me and considered in my medical decision making (see chart for details).   Laceration of left thigh.  Treated with Dermabond.  Wound care instructions discussed and written instructions provided to patient.  Instructed her to follow-up with her PCP if her symptoms are not improving.  She agrees to plan of care.   Final Clinical Impressions(s) / UC Diagnoses   Final diagnoses:  Laceration of left thigh, initial encounter     Discharge Instructions     Follow up with your primary care provider if your symptoms are not improving.        ED Prescriptions    None     PDMP not reviewed this encounter.   Sharion Balloon, NP 07/25/20 [phone]

## 2020-08-29 ENCOUNTER — Encounter: Payer: Self-pay | Admitting: Nurse Practitioner

## 2020-08-29 ENCOUNTER — Ambulatory Visit: Payer: Managed Care, Other (non HMO) | Admitting: Nurse Practitioner

## 2020-08-29 ENCOUNTER — Other Ambulatory Visit: Payer: Self-pay

## 2020-08-29 VITALS — BP 130/84 | HR 85 | Temp 98.4°F | Wt 207.4 lb

## 2020-08-29 DIAGNOSIS — I10 Essential (primary) hypertension: Secondary | ICD-10-CM | POA: Diagnosis not present

## 2020-08-29 DIAGNOSIS — Z638 Other specified problems related to primary support group: Secondary | ICD-10-CM | POA: Diagnosis not present

## 2020-08-29 DIAGNOSIS — E559 Vitamin D deficiency, unspecified: Secondary | ICD-10-CM

## 2020-08-29 DIAGNOSIS — E7800 Pure hypercholesterolemia, unspecified: Secondary | ICD-10-CM

## 2020-08-29 DIAGNOSIS — T148XXA Other injury of unspecified body region, initial encounter: Secondary | ICD-10-CM

## 2020-08-29 DIAGNOSIS — E669 Obesity, unspecified: Secondary | ICD-10-CM

## 2020-08-29 MED ORDER — LISINOPRIL 40 MG PO TABS
40.0000 mg | ORAL_TABLET | Freq: Every day | ORAL | 4 refills | Status: DC
Start: 2020-08-29 — End: 2021-07-12

## 2020-08-29 MED ORDER — GABAPENTIN 300 MG PO CAPS
300.0000 mg | ORAL_CAPSULE | Freq: Every day | ORAL | 4 refills | Status: DC
Start: 2020-08-29 — End: 2021-07-12

## 2020-08-29 NOTE — Patient Instructions (Signed)
http://APA.org/depression-guideline"> https://clinicalkey.com"> http://point-of-care.elsevierperformancemanager.com/skills/"> http://point-of-care.elsevierperformancemanager.com">  Managing Depression, Adult Depression is a mental health condition that affects your thoughts, feelings, and actions. Being diagnosed with depression can bring you relief if you did not know why you have felt or behaved a certain way. It could also leave you feeling overwhelmed with uncertainty about your future. Preparing yourself tomanage your symptoms can help you feel more positive about your future. How to manage lifestyle changes Managing stress  Stress is your body's reaction to life changes and events, both good and bad. Stress can add to your feelings of depression. Learning to manage your stresscan help lessen your feelings of depression. Try some of the following approaches to reducing your stress (stress reduction techniques): Listen to music that you enjoy and that inspires you. Try using a meditation app or take a meditation class. Develop a practice that helps you connect with your spiritual self. Walk in nature, pray, or go to a place of worship. Do some deep breathing. To do this, inhale slowly through your nose. Pause at the top of your inhale for a few seconds and then exhale slowly, letting your muscles relax. Practice yoga to help relax and work your muscles. Choose a stress reduction technique that suits your lifestyle and personality. These techniques take time and practice to develop. Set aside 5-15 minutes a day to do them. Therapists can offer training in these techniques. Other things you can do to manage stress include: Keeping a stress diary. Knowing your limits and saying no when you think something is too much. Paying attention to how you react to certain situations. You may not be able to control everything, but you can change your reaction. Adding humor to your life by watching funny films  or TV shows. Making time for activities that you enjoy and that relax you.  Medicines Medicines, such as antidepressants, are often a part of treatment for depression. Talk with your pharmacist or health care provider about all the medicines, supplements, and herbal products that you take, their possible side effects, and what medicines and other products are safe to take together. Make sure to report any side effects you may have to your health care provider. Relationships Your health care provider may suggest family therapy, couples therapy, orindividual therapy as part of your treatment. How to recognize changes Everyone responds differently to treatment for depression. As you recover from depression, you may start to: Have more interest in doing activities. Feel less hopeless. Have more energy. Overeat less often, or have a better appetite. Have better mental focus. It is important to recognize if your depression is not getting better or is getting worse. The symptoms you had in the beginning may return, such as: Tiredness (fatigue) or low energy. Eating too much or too little. Sleeping too much or too little. Feeling restless, agitated, or hopeless. Trouble focusing or making decisions. Unexplained physical complaints. Feeling irritable, angry, or aggressive. If you or your family members notice these symptoms coming back, let yourhealth care provider know right away. Follow these instructions at home: Activity  Try to get some form of exercise each day, such as walking, biking, swimming, or lifting weights. Practice stress reduction techniques. Engage your mind by taking a class or doing some volunteer work.  Lifestyle Get the right amount and quality of sleep. Cut down on using caffeine, tobacco, alcohol, and other potentially harmful substances. Eat a healthy diet that includes plenty of vegetables, fruits, whole grains, low-fat dairy products, and lean protein. Do not   eat  a lot of foods that are high in solid fats, added sugars, or salt (sodium). General instructions Take over-the-counter and prescription medicines only as told by your health care provider. Keep all follow-up visits as told by your health care provider. This is important. Where to find support Talking to others  Friends and family members can be sources of support and guidance. Talk to trusted friends or family members about your condition. Explain your symptoms to them, and let them know that you are working with a health care provider to treat your depression. Tell friends and family members how they also can behelpful. Finances Find appropriate mental health providers that fit with your financial situation. Talk with your health care provider about options to get reduced prices on your medicines. Where to find more information You can find support in your area from: Anxiety and Depression Association of America (ADAA): www.adaa.org Mental Health America: www.mentalhealthamerica.net National Alliance on Mental Illness: www.nami.org Contact a health care provider if: You stop taking your antidepressant medicines, and you have any of these symptoms: Nausea. Headache. Light-headedness. Chills and body aches. Not being able to sleep (insomnia). You or your friends and family think your depression is getting worse. Get help right away if: You have thoughts of hurting yourself or others. If you ever feel like you may hurt yourself or others, or have thoughts about taking your own life, get help right away. Go to your nearest emergency department or: Call your local emergency services (911 in the [place] Call a suicide crisis helpline, such as the National Suicide Prevention Lifeline at 1-800-273-8255. This is open 24 hours a day in the [place] Text the Crisis Text Line at 741741 (in the [place] Summary If you are diagnosed with depression, preparing yourself to manage your symptoms is a good way  to feel positive about your future. Work with your health care provider on a management plan that includes stress reduction techniques, medicines (if applicable), therapy, and healthy lifestyle habits. Keep talking with your health care provider about how your treatment is working. If you have thoughts about taking your own life, call a suicide crisis helpline or text a crisis text line. This information is not intended to replace advice given to you by your health care provider. Make sure you discuss any questions you have with your healthcare provider. Document Revised: 01/06/2019 Document Reviewed: 01/06/2019 Elsevier Patient Education  2022 Elsevier Inc.  

## 2020-08-29 NOTE — Assessment & Plan Note (Signed)
Check lipid panel today and recommend heavy focus on diet changes and weight loss.  ASCVD 2.4%, but does have significant family history of stroke and MI -- would recommend statin at low dose, discussed with her.

## 2020-08-29 NOTE — Assessment & Plan Note (Signed)
Chronic, stable with BP at goal in office today. - Continue Lisinopril at 40 MG and Amlodipine 10 MG daily. - CMP check today - Recommend she continue to monitor BP daily and document + focus on a low sodium (DASH) diet at home.   - Return in 6 months, refills sent in as needed.

## 2020-08-29 NOTE — Assessment & Plan Note (Signed)
History of low levels, continues daily supplement.  Recheck level today and adjust regimen as needed. 

## 2020-08-29 NOTE — Assessment & Plan Note (Signed)
BMI 35.28.  Recommended eating smaller high protein, low fat meals more frequently and exercising 30 mins a day 5 times a week with a goal of 10-15lb weight loss in the next 3 months. Patient voiced their understanding and motivation to adhere to these recommendations.

## 2020-08-29 NOTE — Progress Notes (Signed)
BP 130/84   Pulse 85   Temp 98.4 F (36.9 C) (Oral)   Wt 207 lb 6.4 oz (94.1 kg)   LMP 02/18/2012   SpO2 96%   BMI 35.28 kg/m    Subjective:    Patient ID: Shannon Marsh, female    DOB: 20-Jul-1965, 55 y.o.   MRN: [id number]  HPI: Shannon Marsh is a 55 y.o. female  Chief Complaint  Patient presents with   Hypertension   Mood   Laceration    Patient states she would like to discuss a injury to a vein when shaving. Patient states she went to [facility] and they put Dermabond on it to help close the cut.    LACERATION: Cut herself shaving recently and went to urgent care to have assessed due to blood "squirting" out, Dermabond was applied to area.  By time she got to urgent care it has stopped bleeding.  Denies pain, redness, or edema.    HYPERTENSION Continues on Lisinopril 40 MG and Amlodipine to 10 MG daily.  Tolerating increased without ADR.  Does have history of elevation in LDL in October 152 -- does have family cardiac history. Hypertension status: stable  Satisfied with current treatment? yes Duration of hypertension: chronic BP monitoring frequency:  not checking BP range:  BP medication side effects:  no Medication compliance: good compliance Aspirin: no Recurrent headaches: no Visual changes: no Palpitations: no Dyspnea: no Chest pain: no Lower extremity edema: no Dizzy/lightheaded: no  The 10-year ASCVD risk score Mikey Bussing DC Jr., et al., 2013) is: 2.4%   Values used to calculate the score:     Age: 38 years     Sex: Female     Is Non-Hispanic African American: No     Diabetic: No     Tobacco smoker: No     Systolic Blood Pressure: 824 mmHg     Is BP treated: Yes     HDL Cholesterol: 70 mg/dL     Total Cholesterol: 240 mg/dL  DEPRESSION Continues on Sertraline 100 MG daily.   Mood status: controlled Satisfied with current treatment?: yes Symptom severity: mild  Duration of current treatment : months Side effects: no Medication compliance: good  compliance Psychotherapy/counseling: in past Depressed mood: no Anxious mood: no Anhedonia: no Significant weight loss or gain: no Insomnia: none Fatigue: no Feelings of worthlessness or guilt: no Impaired concentration/indecisiveness: no Suicidal ideations: no Hopelessness: no Crying spells: no Depression screen [facility] 2/9 08/29/2020 01/04/2020 05/07/2019 03/09/2019 06/17/2018  Decreased Interest 0 0 0 0 1  Down, Depressed, Hopeless 0 0 1 0 1  PHQ - 2 Score 0 0 1 0 2  Altered sleeping 0 0 1 1 2   Tired, decreased energy 0 0 1 0 2  Change in appetite 1 0 1 1 2   Feeling bad or failure about yourself  0 1 0 0 2  Trouble concentrating 0 0 1 1 1   Moving slowly or fidgety/restless 0 0 0 0 0  Suicidal thoughts 0 0 0 0 0  PHQ-9 Score 1 1 5 3 11   Difficult doing work/chores - Not difficult at all Not difficult at all Not difficult at all Somewhat difficult     Relevant past medical, surgical, family and social history reviewed and updated as indicated. Interim medical history since our last visit reviewed. Allergies and medications reviewed and updated.  Review of Systems  Constitutional:  Negative for activity change, appetite change, diaphoresis, fatigue and fever.  Respiratory:  Negative for  cough, chest tightness and shortness of breath.   Cardiovascular:  Negative for chest pain, palpitations and leg swelling.  Gastrointestinal: Negative.   Neurological: Negative.   Psychiatric/Behavioral: Negative.     Per HPI unless specifically indicated above     Objective:    BP 130/84   Pulse 85   Temp 98.4 F (36.9 C) (Oral)   Wt 207 lb 6.4 oz (94.1 kg)   LMP 02/18/2012   SpO2 96%   BMI 35.28 kg/m   Wt Readings from Last 3 Encounters:  08/29/20 207 lb 6.4 oz (94.1 kg)  02/29/20 204 lb 12.8 oz (92.9 kg)  01/04/20 201 lb (91.2 kg)    Physical Exam Vitals and nursing note reviewed.  Constitutional:      General: She is awake. She is not in acute distress.    Appearance: She is  well-developed. She is obese. She is not ill-appearing.  HENT:     Head: Normocephalic.     Right Ear: Hearing normal.     Left Ear: Hearing normal.  Eyes:     General: Lids are normal.        Right eye: No discharge.        Left eye: No discharge.     Conjunctiva/sclera: Conjunctivae normal.     Pupils: Pupils are equal, round, and reactive to light.  Neck:     Thyroid: No thyromegaly.     Vascular: No carotid bruit.  Cardiovascular:     Rate and Rhythm: Normal rate and regular rhythm.     Heart sounds: Normal heart sounds. No murmur heard.   No gallop.  Pulmonary:     Effort: Pulmonary effort is normal. No accessory muscle usage or respiratory distress.     Breath sounds: Normal breath sounds.  Abdominal:     General: Bowel sounds are normal.     Palpations: Abdomen is soft.  Musculoskeletal:     Cervical back: Normal range of motion and neck supple.     Right lower leg: No edema.     Left lower leg: No edema.  Skin:    General: Skin is warm and dry.     Findings: Laceration present.     Comments: Small laceration present to upper, outer left thigh with crusting present.  No erythema, edema, or drainage.  Neurological:     Mental Status: She is alert and oriented to person, place, and time.  Psychiatric:        Attention and Perception: Attention normal.        Mood and Affect: Mood normal.        Speech: Speech normal.        Behavior: Behavior normal. Behavior is cooperative.        Thought Content: Thought content normal.   Results for orders placed or performed in visit on 01/04/20  CBC with Differential/Platelet  Result Value Ref Range   WBC 6.9 3.4 - 10.8 x10E3/uL   RBC 4.63 3.77 - 5.28 x10E6/uL   Hemoglobin 13.8 11.1 - 15.9 g/dL   Hematocrit 41.5 34.0 - 46.6 %   MCV 90 79 - 97 fL   MCH 29.8 26.6 - 33.0 pg   MCHC 33.3 31.5 - 35.7 g/dL   RDW 12.9 11.7 - 15.4 %   Platelets 330 150 - 450 x10E3/uL   Neutrophils 65 Not Estab. %   Lymphs 23 Not Estab. %    Monocytes 6 Not Estab. %   Eos 4 Not Estab. %  Basos 1 Not Estab. %   Neutrophils Absolute 4.5 1.4 - 7.0 x10E3/uL   Lymphocytes Absolute 1.6 0.7 - 3.1 x10E3/uL   Monocytes Absolute 0.4 0.1 - 0.9 x10E3/uL   EOS (ABSOLUTE) 0.2 0.0 - 0.4 x10E3/uL   Basophils Absolute 0.1 0.0 - 0.2 x10E3/uL   Immature Granulocytes 1 Not Estab. %   Immature Grans (Abs) 0.1 0.0 - 0.1 x10E3/uL  Comprehensive metabolic panel  Result Value Ref Range   Glucose 100 (H) 65 - 99 mg/dL   BUN 21 6 - 24 mg/dL   Creatinine, Ser 0.65 0.57 - 1.00 mg/dL   GFR calc non Af Amer 101 >59 mL/min/1.73   GFR calc Af Amer 116 >59 mL/min/1.73   BUN/Creatinine Ratio 32 (H) 9 - 23   Sodium 140 134 - 144 mmol/L   Potassium 4.4 3.5 - 5.2 mmol/L   Chloride 103 96 - 106 mmol/L   CO2 24 20 - 29 mmol/L   Calcium 9.5 8.7 - 10.2 mg/dL   Total Protein 6.9 6.0 - 8.5 g/dL   Albumin 4.5 3.8 - 4.9 g/dL   Globulin, Total 2.4 1.5 - 4.5 g/dL   Albumin/Globulin Ratio 1.9 1.2 - 2.2   Bilirubin Total 0.4 0.0 - 1.2 mg/dL   Alkaline Phosphatase 113 44 - 121 IU/L   AST 13 0 - 40 IU/L   ALT 10 0 - 32 IU/L  Lipid Panel w/o Chol/HDL Ratio  Result Value Ref Range   Cholesterol, Total 240 (H) 100 - 199 mg/dL   Triglycerides 102 0 - 149 mg/dL   HDL 70 >39 mg/dL   VLDL Cholesterol Cal 18 5 - 40 mg/dL   LDL Chol Calc (NIH) 152 (H) 0 - 99 mg/dL  TSH  Result Value Ref Range   TSH 1.600 0.450 - 4.500 uIU/mL  VITAMIN D 25 Hydroxy (Vit-D Deficiency, Fractures)  Result Value Ref Range   Vit D, 25-Hydroxy 23.9 (L) 30.0 - 100.0 ng/mL  Hepatitis C antibody  Result Value Ref Range   Hep C Virus Ab <0.1 0.0 - 0.9 s/co ratio  HIV Antibody (routine testing w rflx)  Result Value Ref Range   HIV Screen 4th Generation wRfx Non Reactive Non Reactive      Assessment & Plan:   Problem List Items Addressed This Visit       Cardiovascular and Mediastinum   Benign hypertension - Primary    Chronic, stable with BP at goal in office today. - Continue  Lisinopril at 40 MG and Amlodipine 10 MG daily. - CMP check today - Recommend she continue to monitor BP daily and document + focus on a low sodium (DASH) diet at home.   - Return in 6 months, refills sent in as needed.       Relevant Medications   lisinopril (ZESTRIL) 40 MG tablet   Other Relevant Orders   Comprehensive metabolic panel     Other   Obesity (BMI 30.0-34.9)    BMI 35.28.  Recommended eating smaller high protein, low fat meals more frequently and exercising 30 mins a day 5 times a week with a goal of 10-15lb weight loss in the next 3 months. Patient voiced their understanding and motivation to adhere to these recommendations.         Caregiver role strain    Chronic, stable with Sertraline at 100 MG.  Continue this current dose and will adjust in future, possibly reduce if improvement in symptoms and stressors.  Denies SI/HI.  Elevated LDL cholesterol level    Check lipid panel today and recommend heavy focus on diet changes and weight loss.  ASCVD 2.4%, but does have significant family history of stroke and MI -- would recommend statin at low dose, discussed with her.        Relevant Orders   Lipid Panel w/o Chol/HDL Ratio   Vitamin D deficiency    History of low levels, continues daily supplement.  Recheck level today and adjust regimen as needed.       Relevant Orders   VITAMIN D 25 Hydroxy (Vit-D Deficiency, Fractures)   Other Visit Diagnoses     Abrasion       To left upper leg, healing.  No s/s infection.        Follow up plan: Return in about 4 months (around 01/04/2021) for Annual physical.

## 2020-08-29 NOTE — Assessment & Plan Note (Signed)
Chronic, stable with Sertraline at 100 MG.  Continue this current dose and will adjust in future, possibly reduce if improvement in symptoms and stressors.  Denies SI/HI.   

## 2020-08-30 ENCOUNTER — Other Ambulatory Visit: Payer: Self-pay | Admitting: Nurse Practitioner

## 2020-08-30 LAB — COMPREHENSIVE METABOLIC PANEL
ALT: 14 IU/L (ref 0–32)
AST: 16 IU/L (ref 0–40)
Albumin/Globulin Ratio: 1.9 (ref 1.2–2.2)
Albumin: 4.6 g/dL (ref 3.8–4.9)
Alkaline Phosphatase: 118 IU/L (ref 44–121)
BUN/Creatinine Ratio: 26 — ABNORMAL HIGH (ref 9–23)
BUN: 18 mg/dL (ref 6–24)
Bilirubin Total: 0.3 mg/dL (ref 0.0–1.2)
CO2: 21 mmol/L (ref 20–29)
Calcium: 9.5 mg/dL (ref 8.7–10.2)
Chloride: 102 mmol/L (ref 96–106)
Creatinine, Ser: 0.68 mg/dL (ref 0.57–1.00)
Globulin, Total: 2.4 g/dL (ref 1.5–4.5)
Glucose: 103 mg/dL — ABNORMAL HIGH (ref 65–99)
Potassium: 4.6 mmol/L (ref 3.5–5.2)
Sodium: 139 mmol/L (ref 134–144)
Total Protein: 7 g/dL (ref 6.0–8.5)
eGFR: 103 mL/min/{1.73_m2} (ref >59)

## 2020-08-30 LAB — LIPID PANEL W/O CHOL/HDL RATIO
Cholesterol, Total: 262 mg/dL — ABNORMAL HIGH (ref 100–199)
HDL: 62 mg/dL (ref >39)
LDL Chol Calc (NIH): 178 mg/dL — ABNORMAL HIGH (ref 0–99)
Triglycerides: 126 mg/dL (ref 0–149)
VLDL Cholesterol Cal: 22 mg/dL (ref 5–40)

## 2020-08-30 LAB — VITAMIN D 25 HYDROXY (VIT D DEFICIENCY, FRACTURES): Vit D, 25-Hydroxy: 28.4 ng/mL — ABNORMAL LOW (ref 30.0–100.0)

## 2020-08-30 MED ORDER — ROSUVASTATIN CALCIUM 10 MG PO TABS: 10.0000 mg | ORAL_TABLET | Freq: Every day | ORAL | 4 refills | Status: DC

## 2020-08-30 NOTE — Progress Notes (Signed)
Contacted via MyChart   Good evening [name], your labs have returned.  Vitamin D level remains a little low, recommend increasing your Vitamin D to 2000 units daily.  Kidney and liver function are normal.  Cholesterol levels continue to trend upwards.  I recommend we start you on a low dose of Rosuvastatin, since your family history has stroke and heart attack.  I will send this in, if any muscle pains in legs or arms present OR increased fatigue, let me know.  We will recheck levels at next visit.  Any questions? Keep being awesome!!  Thank you for allowing me to participate in your care.  I appreciate you. Kindest regards, Copper Basnett

## 2020-11-15 ENCOUNTER — Other Ambulatory Visit: Payer: Self-pay | Admitting: Nurse Practitioner

## 2020-11-16 NOTE — Telephone Encounter (Signed)
Patient last seen in June and has appointment in November.

## 2020-12-04 ENCOUNTER — Other Ambulatory Visit: Payer: Self-pay | Admitting: Nurse Practitioner

## 2021-01-12 ENCOUNTER — Ambulatory Visit (INDEPENDENT_AMBULATORY_CARE_PROVIDER_SITE_OTHER): Payer: Managed Care, Other (non HMO) | Admitting: Nurse Practitioner

## 2021-01-12 ENCOUNTER — Other Ambulatory Visit: Payer: Self-pay

## 2021-01-12 ENCOUNTER — Encounter: Payer: Self-pay | Admitting: Nurse Practitioner

## 2021-01-12 VITALS — BP 130/84 | HR 82 | Temp 98.3°F | Ht 64.0 in | Wt 209.8 lb

## 2021-01-12 DIAGNOSIS — E782 Mixed hyperlipidemia: Secondary | ICD-10-CM | POA: Diagnosis not present

## 2021-01-12 DIAGNOSIS — E559 Vitamin D deficiency, unspecified: Secondary | ICD-10-CM

## 2021-01-12 DIAGNOSIS — Z1211 Encounter for screening for malignant neoplasm of colon: Secondary | ICD-10-CM

## 2021-01-12 DIAGNOSIS — R7301 Impaired fasting glucose: Secondary | ICD-10-CM | POA: Diagnosis not present

## 2021-01-12 DIAGNOSIS — R195 Other fecal abnormalities: Secondary | ICD-10-CM

## 2021-01-12 DIAGNOSIS — E7800 Pure hypercholesterolemia, unspecified: Secondary | ICD-10-CM

## 2021-01-12 DIAGNOSIS — K219 Gastro-esophageal reflux disease without esophagitis: Secondary | ICD-10-CM | POA: Diagnosis not present

## 2021-01-12 DIAGNOSIS — N951 Menopausal and female climacteric states: Secondary | ICD-10-CM

## 2021-01-12 DIAGNOSIS — N6002 Solitary cyst of left breast: Secondary | ICD-10-CM

## 2021-01-12 DIAGNOSIS — I10 Essential (primary) hypertension: Secondary | ICD-10-CM | POA: Diagnosis not present

## 2021-01-12 DIAGNOSIS — Z23 Encounter for immunization: Secondary | ICD-10-CM

## 2021-01-12 DIAGNOSIS — Z1231 Encounter for screening mammogram for malignant neoplasm of breast: Secondary | ICD-10-CM | POA: Diagnosis not present

## 2021-01-12 DIAGNOSIS — E669 Obesity, unspecified: Secondary | ICD-10-CM

## 2021-01-12 DIAGNOSIS — Z0000 Encounter for general adult medical examination without abnormal findings: Secondary | ICD-10-CM

## 2021-01-12 LAB — BAYER DCA HB A1C WAIVED: HB A1C (BAYER DCA - WAIVED): 5.5 % (ref 4.8–5.6)

## 2021-01-12 LAB — MICROALBUMIN, URINE WAIVED
Creatinine, Urine Waived: 300 mg/dL (ref 10–300)
Microalb, Ur Waived: 30 mg/L — ABNORMAL HIGH (ref 0–19)
Microalb/Creat Ratio: <30 mg/g (ref <30)

## 2021-01-12 MED ORDER — SERTRALINE HCL 100 MG PO TABS: 100.0000 mg | ORAL_TABLET | Freq: Every day | ORAL | 4 refills | Status: DC

## 2021-01-12 NOTE — Assessment & Plan Note (Signed)
Chronic, ongoing.  Continue Rosuvastatin and adjust as needed.  Check CMP and lipid panel today.

## 2021-01-12 NOTE — Assessment & Plan Note (Signed)
History of low levels, continues daily supplement.  Recheck level today and adjust regimen as needed. 

## 2021-01-12 NOTE — Progress Notes (Signed)
BP 130/84   Pulse 82   Temp 98.3 F (36.8 C) (Oral)   Ht '5\' 4"'  (1.626 m)   Wt 209 lb 12.8 oz (95.2 kg)   LMP 02/18/2012   SpO2 98%   BMI 36.01 kg/m    Subjective:    Patient ID: Shannon Marsh, female    DOB: 24-Apr-1965, 55 y.o.   MRN: [id number]  HPI: Shannon Marsh is a 55 y.o. female presenting on 01/12/2021 for comprehensive medical examination. Current medical complaints include:none  She currently lives with: husband Menopausal Symptoms: yes  HYPERTENSION Continues on Lisinopril 40 MG and Amlodipine to 10 MG daily.  Rosuvastatin for HLD.  Has history of elevations in glucose on labs and history of gestational diabetes.  Continues to take as needed Omeprazole for GERD with benefit. Hypertension status: stable  Satisfied with current treatment? yes Duration of hypertension: chronic BP monitoring frequency:  occasional BP range: this morning 130/90 and 111/74 BP medication side effects:  no Medication compliance: good compliance Aspirin: no Recurrent headaches: no Visual changes: no Palpitations: no Dyspnea: no Chest pain: no Lower extremity edema: no Dizzy/lightheaded: no  The 10-year ASCVD risk score (Arnett DK, et al., 2019) is: 3.3%   Values used to calculate the score:     Age: 59 years     Sex: Female     Is Non-Hispanic African American: No     Diabetic: No     Tobacco smoker: No     Systolic Blood Pressure: 734 mmHg     Is BP treated: Yes     HDL Cholesterol: 62 mg/dL     Total Cholesterol: 262 mg/dL  DEPRESSION Continues on Sertraline 100 MG daily.  She reports this works well for mood.  Gabapentin 300 MG at bedtime for hot flashes with menopause taken.  Vitamin D continues for history of low levels. Mood status: stable Satisfied with current treatment?: yes Symptom severity: moderate  Duration of current treatment : chronic Side effects: no Medication compliance: good compliance Psychotherapy/counseling: none Depressed mood:  no Anxious mood: no Anhedonia: no Significant weight loss or gain: no Insomnia: none -- takes Melatonin Fatigue: no Feelings of worthlessness or guilt: no Impaired concentration/indecisiveness: no Suicidal ideations: no Hopelessness: no Crying spells: no Depression screen [facility] 2/9 01/12/2021 08/29/2020 01/04/2020 05/07/2019 03/09/2019  Decreased Interest 1 0 0 0 0  Down, Depressed, Hopeless 1 0 0 1 0  PHQ - 2 Score 2 0 0 1 0  Altered sleeping 1 0 0 1 1  Tired, decreased energy 0 0 0 1 0  Change in appetite 1 1 0 1 1  Feeling bad or failure about yourself  0 0 1 0 0  Trouble concentrating 0 0 0 1 1  Moving slowly or fidgety/restless 0 0 0 0 0  Suicidal thoughts 0 0 0 0 0  PHQ-9 Score '4 1 1 5 3  ' Difficult doing work/chores Not difficult at all - Not difficult at all Not difficult at all Not difficult at all   The patient does not have a history of falls. I did not complete a risk assessment for falls. A plan of care for falls was not documented.  Functional Status Survey: Is the patient deaf or have difficulty hearing?: No Does the patient have difficulty seeing, even when wearing glasses/contacts?: No Does the patient have difficulty concentrating, remembering, or making decisions?: No Does the patient have difficulty walking or climbing stairs?: No Does the patient have difficulty dressing or bathing?:  No Does the patient have difficulty doing errands alone such as visiting a doctor's office or shopping?: No   Past Medical History:  Past Medical History:  Diagnosis Date   Anemia    prior to hysterectomy   Bunion, right foot    Gall stones    GERD (gastroesophageal reflux disease)    with spicy foods   Hx gestational diabetes    Hypertension    Varicose vein of leg     Surgical History:  Past Surgical History:  Procedure Laterality Date   ABDOMINAL HYSTERECTOMY  03/23/2012   Procedure: HYSTERECTOMY ABDOMINAL;  Surgeon: Anastasio Auerbach, MD;  Location: [facility];   Service: Gynecology;  Laterality: N/A;  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   BILATERAL SALPINGECTOMY  03/23/2012   Procedure: BILATERAL SALPINGECTOMY;  Surgeon: Anastasio Auerbach, MD;  Location: [facility];  Service: Gynecology;  Laterality: Bilateral;   BREAST CYST ASPIRATION Left    neg   BUNIONECTOMY Left 03/25/2018   Procedure: LAPIDUS - TYPE LEFT;  Surgeon: Samara Deist, DPM;  Location: [facility];  Service: Podiatry;  Laterality: Left;  LAPIPLASTY SET GENERAL WITH LOCAL   BUNIONECTOMY Left    BUNIONECTOMY Right 02/10/2019   Procedure: LAPIDUS TYPE RIGHT;  Surgeon: Samara Deist, DPM;  Location: [facility];  Service: Podiatry;  Laterality: Right;  general with local LAPIPLASTY SET   CHOLECYSTECTOMY  03/09/2014   TOTAL ABDOMINAL HYSTERECTOMY W/ BILATERAL SALPINGOOPHORECTOMY      Medications:  Current Outpatient Medications on File Prior to Visit  Medication Sig   Acetaminophen (TYLENOL 8 HOUR PO) Take by mouth as needed.   amLODipine (NORVASC) 10 MG tablet TAKE 1 TABLET BY MOUTH ONCE DAILY   cholecalciferol (VITAMIN D3) 25 MCG (1000 UNIT) tablet Take 1,000 Units by mouth daily.   fluticasone (FLONASE) 50 MCG/ACT nasal spray Place 2 sprays into both nostrils daily.   gabapentin (NEURONTIN) 300 MG capsule Take 1 capsule (300 mg total) by mouth at bedtime.   lisinopril (ZESTRIL) 40 MG tablet Take 1 tablet (40 mg total) by mouth daily.   Melatonin 1 MG TABS Take 1 mg by mouth as needed.   Multiple Vitamin (MULTIVITAMIN) tablet Take 1 tablet by mouth daily. Occasionally a chewable   omeprazole (PRILOSEC) 20 MG capsule Take 20 mg by mouth daily.   rosuvastatin (CRESTOR) 10 MG tablet Take 1 tablet (10 mg total) by mouth daily.   No current facility-administered medications on file prior to visit.    Allergies:  No Known Allergies  Social History:  Social History   Socioeconomic History   Marital status: Married    Spouse name: Not on file    Number of children: Not on file   Years of education: Not on file   Highest education level: Not on file  Occupational History   Not on file  Tobacco Use   Smoking status: Never   Smokeless tobacco: Never  Vaping Use   Vaping Use: Never used  Substance and Sexual Activity   Alcohol use: Yes    Alcohol/week: 3.0 standard drinks    Types: 3 Standard drinks or equivalent per week   Drug use: No   Sexual activity: Yes    Partners: Male    Birth control/protection: Surgical    Comment: 1st intercourse 55 yo-Fewer than 5 partners  Other Topics Concern   Not on file  Social History Narrative   Not on file   Social Determinants of Health   Financial Resource Strain: Low Risk  Difficulty of Paying Living Expenses: Not hard at all  Food Insecurity: No Food Insecurity   Worried About [place] in the Last Year: Never true   Ran Out of Food in the Last Year: Never true  Transportation Needs: No Transportation Needs   Lack of Transportation (Medical): No   Lack of Transportation (Non-Medical): No  Physical Activity: Insufficiently Active   Days of Exercise per Week: 2 days   Minutes of Exercise per Session: 30 min  Stress: No Stress Concern Present   Feeling of Stress : Not at all  Social Connections: Moderately Integrated   Frequency of Communication with Friends and Family: More than three times a week   Frequency of Social Gatherings with Friends and Family: More than three times a week   Attends Religious Services: More than 4 times per year   Active Member of [employer] or Organizations: No   Attends Music therapist: Never   Marital Status: Married  Human resources officer Violence: Not At Risk   Fear of Current or Ex-Partner: No   Emotionally Abused: No   Physically Abused: No   Sexually Abused: No   Social History   Tobacco Use  Smoking Status Never  Smokeless Tobacco Never   Social History   Substance and Sexual Activity  Alcohol Use Yes    Alcohol/week: 3.0 standard drinks   Types: 3 Standard drinks or equivalent per week    Family History:  Family History  Problem Relation Age of Onset   Diabetes Mother    Hypertension Mother    Dementia Mother    Diabetes Father    Heart disease Father    Hypertension Sister    Bullous pemphigoid Sister    Hypertension Brother    Hypertension Brother    Hypertension Brother    Stroke Brother        mild   Diabetes Brother    Cancer Maternal Grandfather        lung   Other Neg Hx    Breast cancer Neg Hx     Past medical history, surgical history, medications, allergies, family history and social history reviewed with patient today and changes made to appropriate areas of the chart.   Review of Systems - negative All other ROS negative except what is listed above and in the HPI.      Objective:    BP 130/84   Pulse 82   Temp 98.3 F (36.8 C) (Oral)   Ht '5\' 4"'  (1.626 m)   Wt 209 lb 12.8 oz (95.2 kg)   LMP 02/18/2012   SpO2 98%   BMI 36.01 kg/m   Wt Readings from Last 3 Encounters:  01/12/21 209 lb 12.8 oz (95.2 kg)  08/29/20 207 lb 6.4 oz (94.1 kg)  02/29/20 204 lb 12.8 oz (92.9 kg)    Physical Exam Vitals and nursing note reviewed.  Constitutional:      General: She is awake. She is not in acute distress.    Appearance: She is well-developed and well-groomed. She is obese. She is not ill-appearing or toxic-appearing.  HENT:     Head: Normocephalic and atraumatic.     Right Ear: Hearing, tympanic membrane, ear canal and external ear normal. No drainage.     Left Ear: Hearing, tympanic membrane, ear canal and external ear normal. No drainage.     Nose: Nose normal.     Right Sinus: No maxillary sinus tenderness or frontal sinus tenderness.  Left Sinus: No maxillary sinus tenderness or frontal sinus tenderness.     Mouth/Throat:     Mouth: Mucous membranes are moist.     Pharynx: Oropharynx is clear. Uvula midline. No pharyngeal swelling, oropharyngeal  exudate or posterior oropharyngeal erythema.  Eyes:     General: Lids are normal.        Right eye: No discharge.        Left eye: No discharge.     Extraocular Movements: Extraocular movements intact.     Conjunctiva/sclera: Conjunctivae normal.     Pupils: Pupils are equal, round, and reactive to light.     Visual Fields: Right eye visual fields normal and left eye visual fields normal.  Neck:     Thyroid: No thyromegaly.     Vascular: No carotid bruit.     Trachea: Trachea normal.  Cardiovascular:     Rate and Rhythm: Normal rate and regular rhythm.     Heart sounds: Normal heart sounds. No murmur heard.   No gallop.  Pulmonary:     Effort: Pulmonary effort is normal. No accessory muscle usage or respiratory distress.     Breath sounds: Normal breath sounds.  Chest:  Breasts:    Right: Normal.     Left: Normal.  Abdominal:     General: Bowel sounds are normal.     Palpations: Abdomen is soft. There is no hepatomegaly or splenomegaly.     Tenderness: There is no abdominal tenderness.  Musculoskeletal:        General: Normal range of motion.     Cervical back: Normal range of motion and neck supple.     Right lower leg: No edema.     Left lower leg: No edema.  Lymphadenopathy:     Head:     Right side of head: No submental, submandibular, tonsillar, preauricular or posterior auricular adenopathy.     Left side of head: No submental, submandibular, tonsillar, preauricular or posterior auricular adenopathy.     Cervical: No cervical adenopathy.     Upper Body:     Right upper body: No supraclavicular, axillary or pectoral adenopathy.     Left upper body: No supraclavicular, axillary or pectoral adenopathy.  Skin:    General: Skin is warm and dry.     Capillary Refill: Capillary refill takes less than 2 seconds.  Neurological:     Mental Status: She is alert and oriented to person, place, and time.     Gait: Gait is intact.     Deep Tendon Reflexes: Reflexes are normal  and symmetric.     Reflex Scores:      Brachioradialis reflexes are 2+ on the right side and 2+ on the left side.      Patellar reflexes are 2+ on the right side and 2+ on the left side. Psychiatric:        Attention and Perception: Attention normal.        Mood and Affect: Mood normal.        Speech: Speech normal.        Behavior: Behavior normal. Behavior is cooperative.        Thought Content: Thought content normal.        Judgment: Judgment normal.   Results for orders placed or performed in visit on 08/29/20  Lipid Panel w/o Chol/HDL Ratio  Result Value Ref Range   Cholesterol, Total 262 (H) 100 - 199 mg/dL   Triglycerides 126 0 - 149 mg/dL   HDL  62 >39 mg/dL   VLDL Cholesterol Cal 22 5 - 40 mg/dL   LDL Chol Calc (NIH) 178 (H) 0 - 99 mg/dL  Comprehensive metabolic panel  Result Value Ref Range   Glucose 103 (H) 65 - 99 mg/dL   BUN 18 6 - 24 mg/dL   Creatinine, Ser 0.68 0.57 - 1.00 mg/dL   eGFR 103 >59 mL/min/1.73   BUN/Creatinine Ratio 26 (H) 9 - 23   Sodium 139 134 - 144 mmol/L   Potassium 4.6 3.5 - 5.2 mmol/L   Chloride 102 96 - 106 mmol/L   CO2 21 20 - 29 mmol/L   Calcium 9.5 8.7 - 10.2 mg/dL   Total Protein 7.0 6.0 - 8.5 g/dL   Albumin 4.6 3.8 - 4.9 g/dL   Globulin, Total 2.4 1.5 - 4.5 g/dL   Albumin/Globulin Ratio 1.9 1.2 - 2.2   Bilirubin Total 0.3 0.0 - 1.2 mg/dL   Alkaline Phosphatase 118 44 - 121 IU/L   AST 16 0 - 40 IU/L   ALT 14 0 - 32 IU/L  VITAMIN D 25 Hydroxy (Vit-D Deficiency, Fractures)  Result Value Ref Range   Vit D, 25-Hydroxy 28.4 (L) 30.0 - 100.0 ng/mL      Assessment & Plan:   Problem List Items Addressed This Visit       Cardiovascular and Mediastinum   Benign hypertension - Primary    Chronic, stable with BP at goal in office today and often at goal at home. Continue Lisinopril at 40 MG and Amlodipine 10 MG daily.  Recommend she monitor BP at least a few mornings a week at home and document.  DASH diet at home.  Labs today: CMP, CBC,  TSH, urine ALB.  Urine ALB returned 30, continue ACE for kidney protection.  Return in 6 months.       Relevant Orders   Microalbumin, Urine Waived   Comprehensive metabolic panel   CBC with Differential/Platelet   TSH   Hot flashes due to menopause    Stable and improved with Gabapentin, has history of hysterectomy, but still has ovaries.  Continue Gabapentin 300 MG QHS is offering benefit to hot flashes at night and sleep pattern.  Recommend she continue light layers at bedtime and using fan.  Return to office in 6 months for chronic disease visit.        Digestive   Gastroesophageal reflux disease without esophagitis    Chronic, stable.  Continue Omeprazole as needed.  Mag level today.      Relevant Orders   Magnesium     Endocrine   IFG (impaired fasting glucose)    History of gestational diabetes, check A1c today and yearly.  A1c today 5.5%.      Relevant Orders   Bayer DCA Hb A1c Waived   Microalbumin, Urine Waived     Other   Cyst of left breast    Continue yearly mammograms.      Mixed hyperlipidemia    Chronic, ongoing.  Continue Rosuvastatin and adjust as needed.  Check CMP and lipid panel today.      Relevant Orders   Lipid Panel w/o Chol/HDL Ratio   Obesity (BMI 30.0-34.9)    BMI 36.01.  Recommended eating smaller high protein, low fat meals more frequently and exercising 30 mins a day 5 times a week with a goal of 10-15lb weight loss in the next 3 months. Patient voiced their understanding and motivation to adhere to these recommendations.  Vitamin D deficiency    History of low levels, continues daily supplement.  Recheck level today and adjust regimen as needed.      Relevant Orders   VITAMIN D 25 Hydroxy (Vit-D Deficiency, Fractures)   Other Visit Diagnoses     Flu vaccine need       Flu vaccine today.   Relevant Orders   Flu Vaccine QUAD 6+ mos PF IM (Fluarix Quad PF)   Encounter for screening mammogram for malignant neoplasm of breast        Mammogram ordered today.   Relevant Orders   MM 3D SCREEN BREAST BILATERAL   Screening for colon cancer       Cologuard ordered today.   Relevant Orders   Cologuard   Encounter for annual physical exam       Annual physical today with labs.  Health maintenance reviewed.          Follow up plan: Return in about 6 months (around 07/12/2021) for HTN/HLD, IFG, MOOD.   LABORATORY TESTING:  - Pap smear: not applicable  IMMUNIZATIONS:   - Tdap: Tetanus vaccination status reviewed: last tetanus booster within 10 years. - Influenza: given today - Pneumovax: Not applicable - Prevnar: Not applicable - HPV: Not applicable - Zostavax vaccine: completed  SCREENING: -Mammogram: Ordered today  - Colonoscopy: Cologuard ordered - Bone Density: Not applicable  -Hearing Test: Not applicable  -Spirometry: Not applicable   PATIENT COUNSELING:   Advised to take 1 mg of folate supplement per day if capable of pregnancy.   Sexuality: Discussed sexually transmitted diseases, partner selection, use of condoms, avoidance of unintended pregnancy  and contraceptive alternatives.   Advised to avoid cigarette smoking.  I discussed with the patient that most people either abstain from alcohol or drink within safe limits (<=14/week and <=4 drinks/occasion for males, <=7/weeks and <= 3 drinks/occasion for females) and that the risk for alcohol disorders and other health effects rises proportionally with the number of drinks per week and how often a drinker exceeds daily limits.  Discussed cessation/primary prevention of drug use and availability of treatment for abuse.   Diet: Encouraged to adjust caloric intake to maintain  or achieve ideal body weight, to reduce intake of dietary saturated fat and total fat, to limit sodium intake by avoiding high sodium foods and not adding table salt, and to maintain adequate dietary potassium and calcium preferably from fresh fruits, vegetables, and low-fat  dairy products.    Stressed the importance of regular exercise  Injury prevention: Discussed safety belts, safety helmets, smoke detector, smoking near bedding or upholstery.   Dental health: Discussed importance of regular tooth brushing, flossing, and dental visits.    NEXT PREVENTATIVE PHYSICAL DUE IN 1 YEAR. Return in about 6 months (around 07/12/2021) for HTN/HLD, IFG, MOOD.

## 2021-01-12 NOTE — Assessment & Plan Note (Signed)
Continue yearly mammograms 

## 2021-01-12 NOTE — Assessment & Plan Note (Signed)
Chronic, stable with BP at goal in office today and often at goal at home. Continue Lisinopril at 40 MG and Amlodipine 10 MG daily.  Recommend she monitor BP at least a few mornings a week at home and document.  DASH diet at home.  Labs today: CMP, CBC, TSH, urine ALB.  Urine ALB returned 30, continue ACE for kidney protection.  Return in 6 months.

## 2021-01-12 NOTE — Assessment & Plan Note (Signed)
History of gestational diabetes, check A1c today and yearly.  A1c today 5.5%.

## 2021-01-12 NOTE — Assessment & Plan Note (Signed)
BMI 36.01.  Recommended eating smaller high protein, low fat meals more frequently and exercising 30 mins a day 5 times a week with a goal of 10-15lb weight loss in the next 3 months. Patient voiced their understanding and motivation to adhere to these recommendations.

## 2021-01-12 NOTE — Assessment & Plan Note (Signed)
Chronic, stable.  Continue Omeprazole as needed.  Mag level today.

## 2021-01-12 NOTE — Assessment & Plan Note (Signed)
Stable and improved with Gabapentin, has history of hysterectomy, but still has ovaries.  Continue Gabapentin 300 MG QHS is offering benefit to hot flashes at night and sleep pattern.  Recommend she continue light layers at bedtime and using fan.  Return to office in 6 months for chronic disease visit.

## 2021-01-12 NOTE — Patient Instructions (Signed)
[facility]  Address: [place], [place],  [address]  Phone: [phone]  Mammogram A mammogram is an X-ray of the breasts. This is done to check for changes that are not normal. This test can look for changes that may be caused by breast cancer or other problems. Mammograms are regularly done on women beginning at age 55. A man may have a mammogram if he has a lump or swelling in his breast. Tell a doctor: About any allergies you have. If you have breast implants. If you have had breast disease, biopsy, or surgery. If you have a family history of breast cancer. If you are breastfeeding. Whether you are pregnant or may be pregnant. What are the risks? Generally, this is a safe procedure. But problems may occur, including: Being exposed to radiation. Radiation levels are very low with this test. The need for more tests. The results were not read properly. Trouble finding breast cancer in women with dense breasts. What happens before the test? Have this test done about 1-2 weeks after your menstrual period. This is often when your breasts are the least tender. If you are visiting a new doctor or clinic, have any past mammogram images sent to your new doctor's office. Wash your breasts and under your arms on the day of the test. Do not use deodorants, perfumes, lotions, or powders on the day of the test. Take off any jewelry from your neck. Wear clothes that you can change into and out of easily. What happens during the test?  You will take off your clothes from the waist up. You will put on a gown. You will stand in front of the X-ray machine. Each breast will be placed between two plastic or glass plates. The plates will press down on your breast for a few seconds. Try to relax. This does not cause any harm to your breasts. It may not feel comfortable, but it will be very brief. X-rays will be taken from different angles of each  breast. The procedure may vary among doctors and hospitals. What can I expect after the test? The mammogram will be read by a specialist (radiologist). You may need to do parts of the test again. This depends on the quality of the images. You may go back to your normal activities. It is up to you to get the results of your test. Ask how to get your results when they are ready. Summary A mammogram is an X-ray of the breasts. It looks for changes that may be caused by breast cancer or other problems. A man may have this test if he has a lump or swelling in his breast. Before the test, tell your doctor about any breast problems that you have had in the past. Have this test done about 1-2 weeks after your menstrual period. Ask when your test results will be ready. Make sure you get your test results. This information is not intended to replace advice given to you by your health care provider. Make sure you discuss any questions you have with your health care provider. Document Revised: 11/08/2020 Document Reviewed: 12/27/2019 Elsevier Patient Education  2022 [employer].

## 2021-01-13 LAB — CBC WITH DIFFERENTIAL/PLATELET
Basophils Absolute: 0.1 10*3/uL (ref 0.0–0.2)
Basos: 1 %
EOS (ABSOLUTE): 0.2 10*3/uL (ref 0.0–0.4)
Eos: 3 %
Hematocrit: 41.2 % (ref 34.0–46.6)
Hemoglobin: 13.7 g/dL (ref 11.1–15.9)
Immature Grans (Abs): 0 10*3/uL (ref 0.0–0.1)
Immature Granulocytes: 1 %
Lymphocytes Absolute: 1.9 10*3/uL (ref 0.7–3.1)
Lymphs: 24 %
MCH: 30 pg (ref 26.6–33.0)
MCHC: 33.3 g/dL (ref 31.5–35.7)
MCV: 90 fL (ref 79–97)
Monocytes Absolute: 0.5 10*3/uL (ref 0.1–0.9)
Monocytes: 6 %
Neutrophils Absolute: 5.3 10*3/uL (ref 1.4–7.0)
Neutrophils: 65 %
Platelets: 327 10*3/uL (ref 150–450)
RBC: 4.57 x10E6/uL (ref 3.77–5.28)
RDW: 12.5 % (ref 11.7–15.4)
WBC: 7.9 10*3/uL (ref 3.4–10.8)

## 2021-01-13 LAB — TSH: TSH: 1.55 u[IU]/mL (ref 0.450–4.500)

## 2021-01-13 LAB — COMPREHENSIVE METABOLIC PANEL
ALT: 17 IU/L (ref 0–32)
AST: 18 IU/L (ref 0–40)
Albumin/Globulin Ratio: 1.9 (ref 1.2–2.2)
Albumin: 4.5 g/dL (ref 3.8–4.9)
Alkaline Phosphatase: 115 IU/L (ref 44–121)
BUN/Creatinine Ratio: 24 — ABNORMAL HIGH (ref 9–23)
BUN: 17 mg/dL (ref 6–24)
Bilirubin Total: 0.3 mg/dL (ref 0.0–1.2)
CO2: 23 mmol/L (ref 20–29)
Calcium: 9.2 mg/dL (ref 8.7–10.2)
Chloride: 104 mmol/L (ref 96–106)
Creatinine, Ser: 0.72 mg/dL (ref 0.57–1.00)
Globulin, Total: 2.4 g/dL (ref 1.5–4.5)
Glucose: 112 mg/dL — ABNORMAL HIGH (ref 70–99)
Potassium: 4.2 mmol/L (ref 3.5–5.2)
Sodium: 141 mmol/L (ref 134–144)
Total Protein: 6.9 g/dL (ref 6.0–8.5)
eGFR: 99 mL/min/{1.73_m2} (ref >59)

## 2021-01-13 LAB — LIPID PANEL W/O CHOL/HDL RATIO
Cholesterol, Total: 151 mg/dL (ref 100–199)
HDL: 56 mg/dL (ref >39)
LDL Chol Calc (NIH): 77 mg/dL (ref 0–99)
Triglycerides: 95 mg/dL (ref 0–149)
VLDL Cholesterol Cal: 18 mg/dL (ref 5–40)

## 2021-01-13 LAB — VITAMIN D 25 HYDROXY (VIT D DEFICIENCY, FRACTURES): Vit D, 25-Hydroxy: 48.9 ng/mL (ref 30.0–100.0)

## 2021-01-13 LAB — MAGNESIUM: Magnesium: 2.3 mg/dL (ref 1.6–2.3)

## 2021-01-13 NOTE — Progress Notes (Signed)
Contacted via [place] morning Shannon Marsh, your labs have returned and overall are wonderful: - Kidney function, creatinine and eGFR is normal.  You are passing a little protein in urine, but your Lisinopril is kidney protective with this and will offer kidney protection. - Liver function, AST and ALT, is normal - CBC shows no anemia or infection - Your cholesterol levels are much improved with Rosuvastatin on board, continue this.  Your LDL went from 178 to now 77 and total cholesterol from 262 to now 151 -- this is wonderful!!!!  Stroke preventative levels. - Thyroid and Vitamin D are norma.  Continue Vitamin D supplement daily. - Magnesium normal - A1c shows no diabetes or prediabetes Great news all around!!  Have a wonderful weekend and if any questions let me know. Keep being incredible!!  Thank you for allowing me to participate in your care.  I appreciate you. Kindest regards, [name]

## 2021-01-26 LAB — COLOGUARD: COLOGUARD: POSITIVE — AB

## 2021-01-28 NOTE — Addendum Note (Signed)
Addended by: Marnee Guarneri T on: 01/28/2021 08:45 AM   Modules accepted: Orders

## 2021-01-28 NOTE — Progress Notes (Signed)
Contacted via MyChart = please contact patient though to ensure she received below message: Good morning Shannon Marsh, your Cologuard has returned and unfortunately is positive.  This means that we do need to send you for a colonoscopy to further assess.  There may be possible polyps that need removing, does not mean there is cancer present, but may be polyps that are precancerous which would benefit removal.  I will place referral to GI and they will call you to schedule.  Any questions? Keep being awesome!!  Thank you for allowing me to participate in your care.  I appreciate you. Kindest regards, Dennard Vezina

## 2021-01-31 ENCOUNTER — Telehealth: Payer: Self-pay | Admitting: Gastroenterology

## 2021-01-31 ENCOUNTER — Telehealth: Payer: Self-pay

## 2021-01-31 ENCOUNTER — Other Ambulatory Visit: Payer: Self-pay

## 2021-01-31 MED ORDER — NA SULFATE-K SULFATE-MG SULF 17.5-3.13-1.6 GM/177ML PO SOLN: 1.0000 | ORAL | 0 refills | Status: DC

## 2021-01-31 NOTE — Telephone Encounter (Signed)
Inbound call from pt requesting a call back to r/s her procedure. Please advise. Thank you.

## 2021-01-31 NOTE — Telephone Encounter (Signed)
Procedure has been rescheduled for 03/09/21.

## 2021-01-31 NOTE — Telephone Encounter (Signed)
Gastroenterology Pre-Procedure Review  Request Date: 02/23/21 Requesting Physician: Dr. Allen Norris  PATIENT REVIEW QUESTIONS: The patient responded to the following health history questions as indicated:    1. Are you having any GI issues? no 2. Do you have a personal history of Polyps? no 3. Do you have a family history of Colon Cancer or Polyps? yes (brother had polyps) 4. Diabetes Mellitus? no 5. Joint replacements in the past 12 months?no 6. Major health problems in the past 3 months?no 7. Any artificial heart valves, MVP, or defibrillator?no    MEDICATIONS & ALLERGIES:    Patient reports the following regarding taking any anticoagulation/antiplatelet therapy:   Plavix, Coumadin, Eliquis, Xarelto, Lovenox, Pradaxa, Brilinta, or Effient? no Aspirin? no  Patient confirms/reports the following medications:  Current Outpatient Medications  Medication Sig Dispense Refill   Acetaminophen (TYLENOL 8 HOUR PO) Take by mouth as needed.     amLODipine (NORVASC) 10 MG tablet TAKE 1 TABLET BY MOUTH ONCE DAILY 90 tablet 4   cholecalciferol (VITAMIN D3) 25 MCG (1000 UNIT) tablet Take 1,000 Units by mouth daily.     fluticasone (FLONASE) 50 MCG/ACT nasal spray Place 2 sprays into both nostrils daily.     gabapentin (NEURONTIN) 300 MG capsule Take 1 capsule (300 mg total) by mouth at bedtime. 90 capsule 4   lisinopril (ZESTRIL) 40 MG tablet Take 1 tablet (40 mg total) by mouth daily. 90 tablet 4   Melatonin 1 MG TABS Take 1 mg by mouth as needed.     Multiple Vitamin (MULTIVITAMIN) tablet Take 1 tablet by mouth daily. Occasionally a chewable     omeprazole (PRILOSEC) 20 MG capsule Take 20 mg by mouth daily.     rosuvastatin (CRESTOR) 10 MG tablet Take 1 tablet (10 mg total) by mouth daily. 90 tablet 4   sertraline (ZOLOFT) 100 MG tablet Take 1 tablet (100 mg total) by mouth daily. 90 tablet 4   No current facility-administered medications for this visit.    Patient confirms/reports the following  allergies:  No Known Allergies  No orders of the defined types were placed in this encounter.   AUTHORIZATION INFORMATION Primary Insurance: 1D#: Group #:  Secondary Insurance: 1D#: Group #:  SCHEDULE INFORMATION: Date:  Time: Location:

## 2021-02-05 ENCOUNTER — Encounter: Payer: Self-pay | Admitting: Nurse Practitioner

## 2021-02-05 ENCOUNTER — Telehealth (INDEPENDENT_AMBULATORY_CARE_PROVIDER_SITE_OTHER): Payer: Managed Care, Other (non HMO) | Admitting: Nurse Practitioner

## 2021-02-05 ENCOUNTER — Ambulatory Visit: Payer: Self-pay

## 2021-02-05 DIAGNOSIS — U071 COVID-19: Secondary | ICD-10-CM | POA: Diagnosis not present

## 2021-02-05 MED ORDER — MOLNUPIRAVIR EUA 200MG CAPSULE: 4.0000 | ORAL_CAPSULE | Freq: Two times a day (BID) | ORAL | 0 refills | Status: AC

## 2021-02-05 NOTE — Progress Notes (Signed)
LMP 02/18/2012    Subjective:    Patient ID: Shannon Marsh, female    DOB: Aug 12, 1965, 55 y.o.   MRN: [id number]  HPI: Shannon Marsh is a 55 y.o. female  Chief Complaint  Patient presents with   Covid Positive    Patient states she tested positive for COVID on an at-home today. Patient states she became symptomatic Saturday. Patient states she has had headaches, body aches, chills and had a low-grade fever (Sunday). Patient states she has tried Tylenol. Patient denies having a headache today.    This visit was completed via telephone due to the restrictions of the COVID-19 pandemic. All issues as above were discussed and addressed but no physical exam was performed. If it was felt that the patient should be evaluated in the office, they were directed there. The patient verbally consented to this visit. Patient was unable to complete an audio/visual visit due to telephone only. Due to the catastrophic nature of the COVID-19 pandemic, this visit was done through audio contact only. Location of the patient: home Location of the provider: work Those involved with this call:  Provider: Marnee Guarneri, DNP CMA: Irena Reichmann, [place] Desk/Registration: Leota Jacobsen  Time spent on call:  21 minutes on the phone discussing health concerns. 15 minutes total spent in review of patient's record and preparation of their chart.  I verified patient identity using two factors (patient name and date of birth). Patient consents verbally to being seen via telemedicine visit today.    COVID POSITIVE Tested positive on 02/05/21, symptoms started on 02/03/21 at night with headache and body aches. Her daughter had been sick and thought it was sinus infection.  Has had Covid vaccinations. Fever: yes low grade and chills Cough: no Shortness of breath: no Wheezing: no Chest pain: none Chest tightness: no Chest congestion: no Nasal congestion: yes Runny nose: yes Post nasal drip:  yes Sneezing: no Sore throat: no Swollen glands: no Sinus pressure: yes Headache: yes Face pain: no Toothache: no Ear pain: none Ear pressure: none Eyes red/itching:no Eye drainage/crusting: no  Vomiting: no Rash: no Fatigue: yes Sick contacts: yes Strep contacts: no  Context: stable Recurrent sinusitis: no Relief with OTC cold/cough medications: yes  Treatments attempted:  Coricidin and Flonase     Relevant past medical, surgical, family and social history reviewed and updated as indicated. Interim medical history since our last visit reviewed. Allergies and medications reviewed and updated.  Review of Systems  Constitutional:  Positive for chills, fatigue and fever. Negative for activity change and appetite change.  HENT:  Positive for congestion, postnasal drip and rhinorrhea. Negative for ear discharge, ear pain, facial swelling, sinus pressure, sinus pain, sneezing, sore throat and voice change.   Respiratory:  Negative for cough, chest tightness, shortness of breath and wheezing.   Cardiovascular:  Negative for chest pain, palpitations and leg swelling.  Gastrointestinal: Negative.   Endocrine: Negative.   Musculoskeletal:  Positive for myalgias.  Neurological: Negative.   Psychiatric/Behavioral: Negative.     Per HPI unless specifically indicated above     Objective:    LMP 02/18/2012   Wt Readings from Last 3 Encounters:  01/12/21 209 lb 12.8 oz (95.2 kg)  08/29/20 207 lb 6.4 oz (94.1 kg)  02/29/20 204 lb 12.8 oz (92.9 kg)    Physical Exam  Unable to assess due to telephone visit.    Results for orders placed or performed in visit on 01/12/21  Bayer [place] Hb A1c Waived  Result Value Ref Range   HB A1C (BAYER DCA - WAIVED) 5.5 4.8 - 5.6 %  Microalbumin, Urine Waived  Result Value Ref Range   Microalb, Ur Waived 30 (H) 0 - 19 mg/L   Creatinine, Urine Waived 300 10 - 300 mg/dL   Microalb/Creat Ratio <30 <30 mg/g  Comprehensive metabolic panel  Result  Value Ref Range   Glucose 112 (H) 70 - 99 mg/dL   BUN 17 6 - 24 mg/dL   Creatinine, Ser 0.72 0.57 - 1.00 mg/dL   eGFR 99 >59 mL/min/1.73   BUN/Creatinine Ratio 24 (H) 9 - 23   Sodium 141 134 - 144 mmol/L   Potassium 4.2 3.5 - 5.2 mmol/L   Chloride 104 96 - 106 mmol/L   CO2 23 20 - 29 mmol/L   Calcium 9.2 8.7 - 10.2 mg/dL   Total Protein 6.9 6.0 - 8.5 g/dL   Albumin 4.5 3.8 - 4.9 g/dL   Globulin, Total 2.4 1.5 - 4.5 g/dL   Albumin/Globulin Ratio 1.9 1.2 - 2.2   Bilirubin Total 0.3 0.0 - 1.2 mg/dL   Alkaline Phosphatase 115 44 - 121 IU/L   AST 18 0 - 40 IU/L   ALT 17 0 - 32 IU/L  CBC with Differential/Platelet  Result Value Ref Range   WBC 7.9 3.4 - 10.8 x10E3/uL   RBC 4.57 3.77 - 5.28 x10E6/uL   Hemoglobin 13.7 11.1 - 15.9 g/dL   Hematocrit 41.2 34.0 - 46.6 %   MCV 90 79 - 97 fL   MCH 30.0 26.6 - 33.0 pg   MCHC 33.3 31.5 - 35.7 g/dL   RDW 12.5 11.7 - 15.4 %   Platelets 327 150 - 450 x10E3/uL   Neutrophils 65 Not Estab. %   Lymphs 24 Not Estab. %   Monocytes 6 Not Estab. %   Eos 3 Not Estab. %   Basos 1 Not Estab. %   Neutrophils Absolute 5.3 1.4 - 7.0 x10E3/uL   Lymphocytes Absolute 1.9 0.7 - 3.1 x10E3/uL   Monocytes Absolute 0.5 0.1 - 0.9 x10E3/uL   EOS (ABSOLUTE) 0.2 0.0 - 0.4 x10E3/uL   Basophils Absolute 0.1 0.0 - 0.2 x10E3/uL   Immature Granulocytes 1 Not Estab. %   Immature Grans (Abs) 0.0 0.0 - 0.1 x10E3/uL  Lipid Panel w/o Chol/HDL Ratio  Result Value Ref Range   Cholesterol, Total 151 100 - 199 mg/dL   Triglycerides 95 0 - 149 mg/dL   HDL 56 >39 mg/dL   VLDL Cholesterol Cal 18 5 - 40 mg/dL   LDL Chol Calc (NIH) 77 0 - 99 mg/dL  TSH  Result Value Ref Range   TSH 1.550 0.450 - 4.500 uIU/mL  VITAMIN D 25 Hydroxy (Vit-D Deficiency, Fractures)  Result Value Ref Range   Vit D, 25-Hydroxy 48.9 30.0 - 100.0 ng/mL  Magnesium  Result Value Ref Range   Magnesium 2.3 1.6 - 2.3 mg/dL  Cologuard  Result Value Ref Range   COLOGUARD Positive (A) Negative       Assessment & Plan:   Problem List Items Addressed This Visit       Other   Lab test positive for detection of COVID-19 virus    Acute since 02/03/21, positive on home testing.  Husband and daughter are both sick as well.  Educated on treatment options available. She would like to try Molnupiravir, script sent in for this and educated her on this + side effects.  Recommend she self quarantine for 5 days and  then 5 days of masking.  If any worsening symptoms immediately alert provider.  Continue at home OTC regimen for symptom relief.  Return to office as needed.       I discussed the assessment and treatment plan with the patient. The patient was provided an opportunity to ask questions and all were answered. The patient agreed with the plan and demonstrated an understanding of the instructions.   The patient was advised to call back or seek an in-person evaluation if the symptoms worsen or if the condition fails to improve as anticipated.   I provided 21+ minutes of time during this encounter.   Follow up plan: Return if symptoms worsen or fail to improve.

## 2021-02-05 NOTE — Telephone Encounter (Signed)
Reason for Disposition  [1] COVID-19 diagnosed by positive lab test (e.g., PCR, rapid self-test kit) AND [2] mild symptoms (e.g., cough, fever, others) AND [1] no complications or SOB  Answer Assessment - Initial Assessment Questions 1. COVID-19 DIAGNOSIS: "Who made your COVID-19 diagnosis?" "Was it confirmed by a positive lab test or self-test?" If not diagnosed by a doctor (or NP/PA), ask "Are there lots of cases (community spread) where you live?" Note: See public health department website, if unsure.     Home test+ 2. COVID-19 EXPOSURE: "Was there any known exposure to COVID before the symptoms began?" CDC Definition of close contact: within 6 feet (2 meters) for a total of 15 minutes or more over a 24-hour period.       3. ONSET: "When did the COVID-19 symptoms start?"      Saturday 4. WORST SYMPTOM: "What is your worst symptom?" (e.g., cough, fever, shortness of breath, muscle aches)     HA & BA 5. COUGH: "Do you have a cough?" If Yes, ask: "How bad is the cough?"       A little 6. FEVER: "Do you have a fever?" If Yes, ask: "What is your temperature, how was it measured, and when did it start?"     99.1 7. RESPIRATORY STATUS: "Describe your breathing?" (e.g., shortness of breath, wheezing, unable to speak)      ok 8. BETTER-SAME-WORSE: "Are you getting better, staying the same or getting worse compared to yesterday?"  If getting worse, ask, "In what way?"     A little better 9. HIGH RISK DISEASE: "Do you have any chronic medical problems?" (e.g., asthma, heart or lung disease, weak immune system, obesity, etc.)     no 10. VACCINE: "Have you had the COVID-19 vaccine?" If Yes, ask: "Which one, how many shots, when did you get it?"        11. BOOSTER: "Have you received your COVID-19 booster?" If Yes, ask: "Which one and when did you get it?"        12. PREGNANCY: "Is there any chance you are pregnant?" "When was your last menstrual period?"       no 13. OTHER SYMPTOMS: "Do you have  any other symptoms?"  (e.g., chills, fatigue, headache, loss of smell or taste, muscle pain, sore throat)        14. O2 SATURATION MONITOR:  "Do you use an oxygen saturation monitor (pulse oximeter) at home?" If Yes, ask "What is your reading (oxygen level) today?" "What is your usual oxygen saturation reading?" (e.g., 95%)  Protocols used: Coronavirus (COVID-19) Diagnosed or Suspected-A-AH

## 2021-02-05 NOTE — Patient Instructions (Signed)

## 2021-02-05 NOTE — Assessment & Plan Note (Signed)
Acute since 02/03/21, positive on home testing.  Husband and daughter are both sick as well.  Educated on treatment options available. She would like to try Molnupiravir, script sent in for this and educated her on this + side effects.  Recommend she self quarantine for 5 days and then 5 days of masking.  If any worsening symptoms immediately alert provider.  Continue at home OTC regimen for symptom relief.  Return to office as needed.

## 2021-02-05 NOTE — Telephone Encounter (Signed)
Pt called. She test + with a home COVID test.   S/S include HA, BA, Chills. Symptoms seem quite mild. Pt is taking OTC medications for S/S.  Gave care advice.   Pt will c/b if needed.

## 2021-02-06 ENCOUNTER — Telehealth: Payer: Managed Care, Other (non HMO) | Admitting: Nurse Practitioner

## 2021-02-06 NOTE — Telephone Encounter (Signed)
Okay thank you

## 2021-02-06 NOTE — Telephone Encounter (Signed)
I added pt to the 1:00 slot, was unable to talk with Pt to confirm with her but I asked pt to call back.

## 2021-02-14 ENCOUNTER — Other Ambulatory Visit: Payer: Self-pay

## 2021-04-12 ENCOUNTER — Encounter: Admission: RE | Payer: Self-pay | Source: Ambulatory Visit

## 2021-04-12 ENCOUNTER — Ambulatory Visit
Admission: RE | Admit: 2021-04-12 | Payer: Managed Care, Other (non HMO) | Source: Ambulatory Visit | Admitting: Gastroenterology

## 2021-04-12 SURGERY — COLONOSCOPY
Anesthesia: General

## 2021-07-08 DIAGNOSIS — R195 Other fecal abnormalities: Secondary | ICD-10-CM | POA: Insufficient documentation

## 2021-07-08 NOTE — Patient Instructions (Addendum)
Please call to schedule your mammogram and/or bone density: ?[facility]  ?Address: [address], [place], [place] [address] ?Phone: [phone]  ? ? ?DASH Eating Plan ?DASH stands for Dietary Approaches to Stop Hypertension. The DASH eating plan is a healthy eating plan that has been shown to: ?Reduce high blood pressure (hypertension). ?Reduce your risk for type 2 diabetes, heart disease, and stroke. ?Help with weight loss. ?What are tips for following this plan? ?Reading food labels ?Check food labels for the amount of salt (sodium) per serving. Choose foods with less than 5 percent of the Daily Value of sodium. Generally, foods with less than 300 milligrams (mg) of sodium per serving fit into this eating plan. ?To find whole grains, look for the word "whole" as the first word in the ingredient list. ?Shopping ?Buy products labeled as "low-sodium" or "no salt added." ?Buy fresh foods. Avoid canned foods and pre-made or frozen meals. ?Cooking ?Avoid adding salt when cooking. Use salt-free seasonings or herbs instead of table salt or sea salt. Check with your health care provider or pharmacist before using salt substitutes. ?Do not fry foods. Cook foods using healthy methods such as baking, boiling, grilling, roasting, and broiling instead. ?Cook with heart-healthy oils, such as olive, canola, avocado, soybean, or sunflower oil. ?Meal planning ? ?Eat a balanced diet that includes: ?4 or more servings of fruits and 4 or more servings of vegetables each day. Try to fill one-half of your plate with fruits and vegetables. ?6-8 servings of whole grains each day. ?Less than 6 oz (170 g) of lean meat, poultry, or fish each day. A 3-oz (85-g) serving of meat is about the same size as a deck of cards. One egg equals 1 oz (28 g). ?2-3 servings of low-fat dairy each day. One serving is 1 cup (237 mL). ?1 serving of nuts, seeds, or beans 5 times each week. ?2-3 servings of  heart-healthy fats. Healthy fats called omega-3 fatty acids are found in foods such as walnuts, flaxseeds, fortified milks, and eggs. These fats are also found in cold-water fish, such as sardines, salmon, and mackerel. ?Limit how much you eat of: ?Canned or prepackaged foods. ?Food that is high in trans fat, such as some fried foods. ?Food that is high in saturated fat, such as fatty meat. ?Desserts and other sweets, sugary drinks, and other foods with added sugar. ?Full-fat dairy products. ?Do not salt foods before eating. ?Do not eat more than 4 egg yolks a week. ?Try to eat at least 2 vegetarian meals a week. ?Eat more home-cooked food and less restaurant, buffet, and fast food. ?Lifestyle ?When eating at a restaurant, ask that your food be prepared with less salt or no salt, if possible. ?If you drink alcohol: ?Limit how much you use to: ?0-1 drink a day for women who are not pregnant. ?0-2 drinks a day for men. ?Be aware of how much alcohol is in your drink. In the [place] one drink equals one 12 oz bottle of beer (355 mL), one 5 oz glass of wine (148 mL), or one 1? oz glass of hard liquor (44 mL). ?General information ?Avoid eating more than 2,300 mg of salt a day. If you have hypertension, you may need to reduce your sodium intake to 1,500 mg a day. ?Work with your health care provider to maintain a healthy body weight or to lose weight. Ask what an ideal weight is for you. ?Get at least 30 minutes  of exercise that causes your heart to beat faster (aerobic exercise) most days of the week. Activities may include walking, swimming, or biking. ?Work with your health care provider or dietitian to adjust your eating plan to your individual calorie needs. ?What foods should I eat? ?Fruits ?All fresh, dried, or frozen fruit. Canned fruit in natural juice (without added sugar). ?Vegetables ?Fresh or frozen vegetables (raw, steamed, roasted, or grilled). Low-sodium or reduced-sodium tomato and vegetable juice.  Low-sodium or reduced-sodium tomato sauce and tomato paste. Low-sodium or reduced-sodium canned vegetables. ?Grains ?Whole-grain or whole-wheat bread. Whole-grain or whole-wheat pasta. Brown rice. [place]. Bulgur. Whole-grain and low-sodium cereals. Pita bread. Low-fat, low-sodium crackers. Whole-wheat flour tortillas. ?Meats and other proteins ?Skinless chicken or [place]. Ground chicken or [place]. Pork with fat trimmed off. Fish and seafood. Egg whites. Dried beans, peas, or lentils. Unsalted nuts, nut butters, and seeds. Unsalted canned beans. Lean cuts of beef with fat trimmed off. Low-sodium, lean precooked or cured meat, such as sausages or meat loaves. ?Dairy ?Low-fat (1%) or fat-free (skim) milk. Reduced-fat, low-fat, or fat-free cheeses. Nonfat, low-sodium ricotta or cottage cheese. Low-fat or nonfat yogurt. Low-fat, low-sodium cheese. ?Fats and oils ?Soft margarine without trans fats. Vegetable oil. Reduced-fat, low-fat, or light mayonnaise and salad dressings (reduced-sodium). Canola, safflower, olive, avocado, soybean, and sunflower oils. Avocado. ?Seasonings and condiments ?Herbs. Spices. Seasoning mixes without salt. ?Other foods ?Unsalted popcorn and pretzels. Fat-free sweets. ?The items listed above may not be a complete list of foods and beverages you can eat. Contact a dietitian for more information. ?What foods should I avoid? ?Fruits ?Canned fruit in a light or heavy syrup. Fried fruit. Fruit in cream or butter sauce. ?Vegetables ?Creamed or fried vegetables. Vegetables in a cheese sauce. Regular canned vegetables (not low-sodium or reduced-sodium). Regular canned tomato sauce and paste (not low-sodium or reduced-sodium). Regular tomato and vegetable juice (not low-sodium or reduced-sodium). Angie Fava. Olives. ?Grains ?Baked goods made with fat, such as croissants, muffins, or some breads. Dry pasta or rice meal packs. ?Meats and other proteins ?Fatty cuts of meat. Ribs. Fried meat. Berniece Salines.  Bologna, salami, and other precooked or cured meats, such as sausages or meat loaves. Fat from the back of a pig (fatback). Bratwurst. Salted nuts and seeds. Canned beans with added salt. Canned or smoked fish. Whole eggs or egg yolks. Chicken or [place] with skin. ?Dairy ?Whole or 2% milk, cream, and half-and-half. Whole or full-fat cream cheese. Whole-fat or sweetened yogurt. Full-fat cheese. Nondairy creamers. Whipped toppings. Processed cheese and cheese spreads. ?Fats and oils ?Butter. Stick margarine. Lard. Shortening. Ghee. Bacon fat. Tropical oils, such as coconut, palm kernel, or palm oil. ?Seasonings and condiments ?Onion salt, garlic salt, seasoned salt, table salt, and sea salt. Worcestershire sauce. Tartar sauce. Barbecue sauce. Teriyaki sauce. Soy sauce, including reduced-sodium. Steak sauce. Canned and packaged gravies. Fish sauce. Oyster sauce. Cocktail sauce. Store-bought horseradish. Ketchup. Mustard. Meat flavorings and tenderizers. Bouillon cubes. Hot sauces. Pre-made or packaged marinades. Pre-made or packaged taco seasonings. Relishes. Regular salad dressings. ?Other foods ?Salted popcorn and pretzels. ?The items listed above may not be a complete list of foods and beverages you should avoid. Contact a dietitian for more information. ?Where to find more information ?National Heart, Lung, and Blood Institute: https://wilson-eaton.com/ ?American Heart Association: www.heart.org ?Academy of Nutrition and Dietetics: www.eatright.org ?[place]: www.kidney.org ?Summary ?The DASH eating plan is a healthy eating plan that has been shown to reduce high blood pressure (hypertension). It may also reduce your risk for type  2 diabetes, heart disease, and stroke. ?When on the DASH eating plan, aim to eat more fresh fruits and vegetables, whole grains, lean proteins, low-fat dairy, and heart-healthy fats. ?With the DASH eating plan, you should limit salt (sodium) intake to 2,300 mg a day. If you have  hypertension, you may need to reduce your sodium intake to 1,500 mg a day. ?Work with your health care provider or dietitian to adjust your eating plan to your individual calorie needs. ?This information is not inte

## 2021-07-12 ENCOUNTER — Encounter: Payer: Self-pay | Admitting: Nurse Practitioner

## 2021-07-12 ENCOUNTER — Ambulatory Visit: Payer: Managed Care, Other (non HMO) | Admitting: Nurse Practitioner

## 2021-07-12 VITALS — BP 122/82 | HR 79 | Temp 98.4°F | Wt 197.6 lb

## 2021-07-12 DIAGNOSIS — E66811 Obesity, class 1: Secondary | ICD-10-CM

## 2021-07-12 DIAGNOSIS — R195 Other fecal abnormalities: Secondary | ICD-10-CM

## 2021-07-12 DIAGNOSIS — E782 Mixed hyperlipidemia: Secondary | ICD-10-CM

## 2021-07-12 DIAGNOSIS — N951 Menopausal and female climacteric states: Secondary | ICD-10-CM | POA: Diagnosis not present

## 2021-07-12 DIAGNOSIS — Z638 Other specified problems related to primary support group: Secondary | ICD-10-CM | POA: Diagnosis not present

## 2021-07-12 DIAGNOSIS — E669 Obesity, unspecified: Secondary | ICD-10-CM

## 2021-07-12 DIAGNOSIS — I10 Essential (primary) hypertension: Secondary | ICD-10-CM | POA: Diagnosis not present

## 2021-07-12 MED ORDER — LISINOPRIL 40 MG PO TABS
40.0000 mg | ORAL_TABLET | Freq: Every day | ORAL | 4 refills | Status: DC
Start: 2021-07-12 — End: 2022-07-30

## 2021-07-12 MED ORDER — SERTRALINE HCL 100 MG PO TABS
100.0000 mg | ORAL_TABLET | Freq: Every day | ORAL | 4 refills | Status: DC
Start: 2021-07-12 — End: 2022-07-30

## 2021-07-12 MED ORDER — AMLODIPINE BESYLATE 10 MG PO TABS
10.0000 mg | ORAL_TABLET | Freq: Every day | ORAL | 4 refills | Status: DC
Start: 2021-07-12 — End: 2022-07-30

## 2021-07-12 MED ORDER — ROSUVASTATIN CALCIUM 10 MG PO TABS: 10.0000 mg | ORAL_TABLET | Freq: Every day | ORAL | 4 refills | Status: DC

## 2021-07-12 MED ORDER — GABAPENTIN 300 MG PO CAPS: 300.0000 mg | ORAL_CAPSULE | Freq: Every day | ORAL | 4 refills | Status: DC

## 2021-07-12 MED ORDER — WEGOVY 0.25 MG/0.5ML ~~LOC~~ SOAJ: 0.2500 mg | SUBCUTANEOUS | 4 refills | Status: DC

## 2021-07-12 NOTE — Assessment & Plan Note (Signed)
BMI 333.92, has lost 12 pounds but is stuck.  Discussed weight loss options with patient.  Has attempted for 6 months to lose weight via regular exercise and diet changes.  At this time she would like to try [facility].  No family history of thyroid cancer or pancreatitis.  Will send in Wegovy 0.25 MG to take weekly, educated her on use and side effects.  Recommended eating smaller high protein, low fat meals more frequently and exercising 30 mins a day 5 times a week with a goal of 10-15lb weight loss in the next 3 months. Patient voiced their understanding and motivation to adhere to these recommendations.  Return in 6 weeks. ? ?

## 2021-07-12 NOTE — Assessment & Plan Note (Signed)
Was scheduled for colonoscopy, but has not attended due to cost and insurance not covering due to + Cologuard.  Plans for next year.  Denies symptoms. ?

## 2021-07-12 NOTE — Progress Notes (Signed)
? ?BP 122/82   Pulse 79   Temp 98.4 ?F (36.9 ?C) (Oral)   Wt 197 lb 9.6 oz (89.6 kg)   LMP 02/18/2012   SpO2 97%   BMI 33.92 kg/m?   ? ?Subjective:  ? ? Patient ID: Shannon Marsh, female    DOB: 07-31-1965, 56 y.o.   MRN: [id number] ? ?HPI: ?Shannon Marsh is a 56 y.o. female ? ?Chief Complaint  ?Patient presents with  ? Depression  ? Hyperlipidemia  ? Hypertension  ? ?Has not gone for colonoscopy, had + Cologuard, she was scheduled but was going to cost her $3000 out of pocket and could not afford at this time.  Plans to obtain next year.  No symptoms at this time. ? ?HYPERTENSION / HYPERLIPIDEMIA ?Currently continues on Amlodipine 10 MG daily, Lisinopril 40 MG daily, and Crestor 10 MG daily.  Has been working on weight loss, has lost 12 pounds at this time -- attempting not to snack as often.  She has been trying to lose weight for 6 months and feels stuck at current loss.  Is exercising, walking.  Discussed weight loss options with her at length.  No family history of thyroid cancer or personal history of pancreatitis.Marland Kitchen ?Satisfied with current treatment? yes ?Duration of hypertension: chronic ?BP monitoring frequency: not checking ?BP range:  ?BP medication side effects: no ?Duration of hyperlipidemia: chronic ?Cholesterol medication side effects: no ?Cholesterol supplements: none ?Medication compliance: good compliance ?Aspirin: no ?Recent stressors: no ?Recurrent headaches: no ?Visual changes: no ?Palpitations: no ?Dyspnea: no ?Chest pain: no ?Lower extremity edema: no ?Dizzy/lightheaded: no ? ?DEPRESSION ?Continues on Zoloft 100 MG daily. One of her brothers passed away last month + daughter struggling with mental health. ?Mood status: controlled ?Satisfied with current treatment?: yes ?Symptom severity: mild  ?Duration of current treatment : chronic ?Side effects: no ?Medication compliance: good compliance ?Psychotherapy/counseling: none ?Depressed mood: yes with loss of brother ?Anxious mood:  no ?Anhedonia: no ?Significant weight loss or gain: no ?Insomnia: none ?Fatigue: no ?Feelings of worthlessness or guilt: no ?Impaired concentration/indecisiveness: no ?Suicidal ideations: no ?Hopelessness: no ?Crying spells: no ? ?  07/12/2021  ?  9:14 AM 01/12/2021  ? 10:39 AM 08/29/2020  ? 11:18 AM 01/04/2020  ? 10:39 AM 05/07/2019  ?  2:12 PM  ?Depression screen PHQ 2/9  ?Decreased Interest 0 1 0 0 0  ?Down, Depressed, Hopeless 0 1 0 0 1  ?PHQ - 2 Score 0 2 0 0 1  ?Altered sleeping 0 1 0 0 1  ?Tired, decreased energy 0 0 0 0 1  ?Change in appetite '1 1 1 ' 0 1  ?Feeling bad or failure about yourself  0 0 0 1 0  ?Trouble concentrating 3 0 0 0 1  ?Moving slowly or fidgety/restless 0 0 0 0 0  ?Suicidal thoughts 0 0 0 0 0  ?PHQ-9 Score '4 4 1 1 5  ' ?Difficult doing work/chores Somewhat difficult Not difficult at all  Not difficult at all Not difficult at all  ?  ? ?  07/12/2021  ?  9:14 AM 05/07/2019  ?  2:14 PM 06/17/2018  ?  1:54 PM 05/27/2018  ? 10:27 AM  ?GAD 7 : Generalized Anxiety Score  ?Nervous, Anxious, on Edge '1 1 1 1  ' ?Control/stop worrying 0 0 2 3  ?Worry too much - different things '1 1 2 3  ' ?Trouble relaxing '1 1 2 2  ' ?Restless '1 1 2 2  ' ?Easily annoyed or irritable 2 1  2 3  ?Afraid - awful might happen '1 1 2 2  ' ?Total GAD 7 Score '7 6 13 16  ' ?Anxiety Difficulty Somewhat difficult Not difficult at all Somewhat difficult Very difficult  ? ? ?Relevant past medical, surgical, family and social history reviewed and updated as indicated. Interim medical history since our last visit reviewed. ?Allergies and medications reviewed and updated. ? ?Review of Systems  ?Constitutional:  Negative for activity change, appetite change, diaphoresis, fatigue and fever.  ?Respiratory:  Negative for cough, chest tightness and shortness of breath.   ?Cardiovascular:  Negative for chest pain, palpitations and leg swelling.  ?Gastrointestinal: Negative.   ?Neurological: Negative.   ?Psychiatric/Behavioral: Negative.    ? ?Per HPI unless  specifically indicated above ? ?   ?Objective:  ?  ?BP 122/82   Pulse 79   Temp 98.4 ?F (36.9 ?C) (Oral)   Wt 197 lb 9.6 oz (89.6 kg)   LMP 02/18/2012   SpO2 97%   BMI 33.92 kg/m?   ?Wt Readings from Last 3 Encounters:  ?07/12/21 197 lb 9.6 oz (89.6 kg)  ?01/12/21 209 lb 12.8 oz (95.2 kg)  ?08/29/20 207 lb 6.4 oz (94.1 kg)  ?  ?Physical Exam ?Vitals and nursing note reviewed.  ?Constitutional:   ?   General: She is awake. She is not in acute distress. ?   Appearance: She is well-developed. She is obese. She is not ill-appearing.  ?HENT:  ?   Head: Normocephalic.  ?   Right Ear: Hearing normal.  ?   Left Ear: Hearing normal.  ?Eyes:  ?   General: Lids are normal.     ?   Right eye: No discharge.     ?   Left eye: No discharge.  ?   Conjunctiva/sclera: Conjunctivae normal.  ?   Pupils: Pupils are equal, round, and reactive to light.  ?Neck:  ?   Thyroid: No thyromegaly.  ?   Vascular: No carotid bruit.  ?Cardiovascular:  ?   Rate and Rhythm: Normal rate and regular rhythm.  ?   Heart sounds: Normal heart sounds. No murmur heard. ?  No gallop.  ?Pulmonary:  ?   Effort: Pulmonary effort is normal. No accessory muscle usage or respiratory distress.  ?   Breath sounds: Normal breath sounds.  ?Abdominal:  ?   General: Bowel sounds are normal.  ?   Palpations: Abdomen is soft.  ?Musculoskeletal:  ?   Cervical back: Normal range of motion and neck supple.  ?   Right lower leg: No edema.  ?   Left lower leg: No edema.  ?Skin: ?   General: Skin is warm and dry.  ?Neurological:  ?   Mental Status: She is alert and oriented to person, place, and time.  ?Psychiatric:     ?   Attention and Perception: Attention normal.     ?   Mood and Affect: Mood normal.     ?   Speech: Speech normal.     ?   Behavior: Behavior normal. Behavior is cooperative.     ?   Thought Content: Thought content normal.  ? ?Results for orders placed or performed in visit on 01/12/21  ?Bayer DCA Hb A1c Waived  ?Result Value Ref Range  ? HB A1C (BAYER  DCA - WAIVED) 5.5 4.8 - 5.6 %  ?Microalbumin, Urine Waived  ?Result Value Ref Range  ? Microalb, Ur Waived 30 (H) 0 - 19 mg/L  ? Creatinine, Urine Waived 300 10 -  300 mg/dL  ? Microalb/Creat Ratio <30 <30 mg/g  ?Comprehensive metabolic panel  ?Result Value Ref Range  ? Glucose 112 (H) 70 - 99 mg/dL  ? BUN 17 6 - 24 mg/dL  ? Creatinine, Ser 0.72 0.57 - 1.00 mg/dL  ? eGFR 99 >59 mL/min/1.73  ? BUN/Creatinine Ratio 24 (H) 9 - 23  ? Sodium 141 134 - 144 mmol/L  ? Potassium 4.2 3.5 - 5.2 mmol/L  ? Chloride 104 96 - 106 mmol/L  ? CO2 23 20 - 29 mmol/L  ? Calcium 9.2 8.7 - 10.2 mg/dL  ? Total Protein 6.9 6.0 - 8.5 g/dL  ? Albumin 4.5 3.8 - 4.9 g/dL  ? Globulin, Total 2.4 1.5 - 4.5 g/dL  ? Albumin/Globulin Ratio 1.9 1.2 - 2.2  ? Bilirubin Total 0.3 0.0 - 1.2 mg/dL  ? Alkaline Phosphatase 115 44 - 121 IU/L  ? AST 18 0 - 40 IU/L  ? ALT 17 0 - 32 IU/L  ?CBC with Differential/Platelet  ?Result Value Ref Range  ? WBC 7.9 3.4 - 10.8 x10E3/uL  ? RBC 4.57 3.77 - 5.28 x10E6/uL  ? Hemoglobin 13.7 11.1 - 15.9 g/dL  ? Hematocrit 41.2 34.0 - 46.6 %  ? MCV 90 79 - 97 fL  ? MCH 30.0 26.6 - 33.0 pg  ? MCHC 33.3 31.5 - 35.7 g/dL  ? RDW 12.5 11.7 - 15.4 %  ? Platelets 327 150 - 450 x10E3/uL  ? Neutrophils 65 Not Estab. %  ? Lymphs 24 Not Estab. %  ? Monocytes 6 Not Estab. %  ? Eos 3 Not Estab. %  ? Basos 1 Not Estab. %  ? Neutrophils Absolute 5.3 1.4 - 7.0 x10E3/uL  ? Lymphocytes Absolute 1.9 0.7 - 3.1 x10E3/uL  ? Monocytes Absolute 0.5 0.1 - 0.9 x10E3/uL  ? EOS (ABSOLUTE) 0.2 0.0 - 0.4 x10E3/uL  ? Basophils Absolute 0.1 0.0 - 0.2 x10E3/uL  ? Immature Granulocytes 1 Not Estab. %  ? Immature Grans (Abs) 0.0 0.0 - 0.1 x10E3/uL  ?Lipid Panel w/o Chol/HDL Ratio  ?Result Value Ref Range  ? Cholesterol, Total 151 100 - 199 mg/dL  ? Triglycerides 95 0 - 149 mg/dL  ? HDL 56 >39 mg/dL  ? VLDL Cholesterol Cal 18 5 - 40 mg/dL  ? LDL Chol Calc (NIH) 77 0 - 99 mg/dL  ?TSH  ?Result Value Ref Range  ? TSH 1.550 0.450 - 4.500 uIU/mL  ?VITAMIN D 25 Hydroxy  (Vit-D Deficiency, Fractures)  ?Result Value Ref Range  ? Vit D, 25-Hydroxy 48.9 30.0 - 100.0 ng/mL  ?Magnesium  ?Result Value Ref Range  ? Magnesium 2.3 1.6 - 2.3 mg/dL  ?Cologuard  ?Result Value Ref Range  ? COLOGUARD P

## 2021-07-12 NOTE — Assessment & Plan Note (Signed)
Chronic, stable with Sertraline at 100 MG.  Continue this current dose and will adjust in future, possibly reduce if improvement in symptoms and stressors.  Denies SI/HI.   

## 2021-07-12 NOTE — Assessment & Plan Note (Signed)
Chronic, stable with BP at goal in office today. Continue Lisinopril at 40 MG and Amlodipine 10 MG daily. Recommend she monitor BP at least a few mornings a week at home and document.  DASH diet at home.  Labs today: BMP.  Urine ALB returned 30 at physical, continue ACE for kidney protection.  Return in 6 months. ? ?

## 2021-07-12 NOTE — Assessment & Plan Note (Signed)
Chronic, ongoing.  Continue Rosuvastatin and adjust as needed.  Check lipid panel today. 

## 2021-07-13 LAB — LIPID PANEL W/O CHOL/HDL RATIO
Cholesterol, Total: 137 mg/dL (ref 100–199)
HDL: 56 mg/dL (ref >39)
LDL Chol Calc (NIH): 64 mg/dL (ref 0–99)
Triglycerides: 90 mg/dL (ref 0–149)
VLDL Cholesterol Cal: 17 mg/dL (ref 5–40)

## 2021-07-13 LAB — BASIC METABOLIC PANEL
BUN/Creatinine Ratio: 29 — ABNORMAL HIGH (ref 9–23)
BUN: 18 mg/dL (ref 6–24)
CO2: 22 mmol/L (ref 20–29)
Calcium: 9.5 mg/dL (ref 8.7–10.2)
Chloride: 105 mmol/L (ref 96–106)
Creatinine, Ser: 0.63 mg/dL (ref 0.57–1.00)
Glucose: 117 mg/dL — ABNORMAL HIGH (ref 70–99)
Potassium: 4.4 mmol/L (ref 3.5–5.2)
Sodium: 140 mmol/L (ref 134–144)
eGFR: 105 mL/min/{1.73_m2} (ref >59)

## 2021-07-13 NOTE — Progress Notes (Signed)
Contacted via [place] -- did you see about need for PA for [facility]? ? ? ?Good afternoon Shannon Marsh, your labs have returned.  Sugar level remains a little elevated, this will be helped by [facility] too.  Kidney function, creatinine and eGFR, remains normal and cholesterol labs at goal!!  Great news!! Any questions? ?Keep being stellar!!  Thank you for allowing me to participate in your care.  I appreciate you. ?Kindest regards, ?Mahamed Zalewski ?

## 2021-07-18 ENCOUNTER — Encounter: Payer: Self-pay | Admitting: Nurse Practitioner

## 2021-07-30 ENCOUNTER — Ambulatory Visit: Payer: Managed Care, Other (non HMO) | Admitting: Family Medicine

## 2021-07-30 ENCOUNTER — Encounter: Payer: Self-pay | Admitting: Family Medicine

## 2021-07-30 VITALS — BP 116/80 | HR 95 | Temp 98.4°F | Wt 198.0 lb

## 2021-07-30 DIAGNOSIS — J020 Streptococcal pharyngitis: Secondary | ICD-10-CM

## 2021-07-30 DIAGNOSIS — J029 Acute pharyngitis, unspecified: Secondary | ICD-10-CM | POA: Diagnosis not present

## 2021-07-30 LAB — RAPID STREP SCREEN (MED CTR MEBANE ONLY): Strep Gp A Ag, IA W/Reflex: POSITIVE — AB

## 2021-07-30 MED ORDER — AMOXICILLIN-POT CLAVULANATE 875-125 MG PO TABS: 1.0000 | ORAL_TABLET | Freq: Two times a day (BID) | ORAL | 0 refills | Status: DC

## 2021-07-30 NOTE — Progress Notes (Signed)
BP 116/80   Pulse 95   Temp 98.4 F (36.9 C)   Wt 198 lb (89.8 kg)   LMP 02/18/2012   SpO2 99%   BMI 33.99 kg/m    Subjective:    Patient ID: Shannon Marsh, female    DOB: [birth year], 56 y.o.   MRN: [id number]  HPI: Shannon Marsh is a 57 y.o. female  Chief Complaint  Patient presents with   URI    Patient states she had fever on Saturday night, has ST, and drainage. Patient states her nephew had strep.    UPPER RESPIRATORY TRACT INFECTION Duration: 3 days Worst symptom: sore throat Fever: yes Cough: no Shortness of breath: no Wheezing: no Chest pain: no Chest tightness: no Chest congestion: no Nasal congestion: no Runny nose: no Post nasal drip: yes Sneezing: no Sore throat: yes Swollen glands: no Sinus pressure: no Headache: no Face pain: no Toothache: no Ear pain: no  Ear pressure: no  Eyes red/itching:no Eye drainage/crusting: no  Vomiting: no Rash: no Fatigue: yes Sick contacts: no Strep contacts: yes  Context: worse Recurrent sinusitis: no Relief with OTC cold/cough medications: no  Treatments attempted: tylenol   Relevant past medical, surgical, family and social history reviewed and updated as indicated. Interim medical history since our last visit reviewed. Allergies and medications reviewed and updated.  Review of Systems  Constitutional: Negative.   HENT:  Positive for congestion and sore throat. Negative for dental problem, drooling, ear discharge, ear pain, facial swelling, hearing loss, mouth sores, nosebleeds, postnasal drip, rhinorrhea, sinus pressure, sinus pain, sneezing, tinnitus, trouble swallowing and voice change.   Respiratory: Negative.    Cardiovascular: Negative.   Musculoskeletal: Negative.   Psychiatric/Behavioral: Negative.     Per HPI unless specifically indicated above     Objective:    BP 116/80   Pulse 95   Temp 98.4 F (36.9 C)   Wt 198 lb (89.8 kg)   LMP 02/18/2012   SpO2 99%   BMI 33.99  kg/m   Wt Readings from Last 3 Encounters:  07/30/21 198 lb (89.8 kg)  07/12/21 197 lb 9.6 oz (89.6 kg)  01/12/21 209 lb 12.8 oz (95.2 kg)    Physical Exam Vitals and nursing note reviewed.  Constitutional:      General: She is not in acute distress.    Appearance: Normal appearance. She is not ill-appearing, toxic-appearing or diaphoretic.  HENT:     Head: Normocephalic and atraumatic.     Right Ear: Tympanic membrane, ear canal and external ear normal.     Left Ear: Tympanic membrane, ear canal and external ear normal.     Nose: Nose normal. No congestion or rhinorrhea.     Mouth/Throat:     Mouth: Mucous membranes are moist.     Pharynx: Oropharyngeal exudate and posterior oropharyngeal erythema present.  Eyes:     General: No scleral icterus.       Right eye: No discharge.        Left eye: No discharge.     Extraocular Movements: Extraocular movements intact.     Conjunctiva/sclera: Conjunctivae normal.     Pupils: Pupils are equal, round, and reactive to light.  Cardiovascular:     Rate and Rhythm: Normal rate and regular rhythm.     Pulses: Normal pulses.     Heart sounds: Normal heart sounds. No murmur heard.   No friction rub. No gallop.  Pulmonary:     Effort: Pulmonary effort is normal.  No respiratory distress.     Breath sounds: Normal breath sounds. No stridor. No wheezing, rhonchi or rales.  Chest:     Chest wall: No tenderness.  Musculoskeletal:        General: Normal range of motion.     Cervical back: Normal range of motion and neck supple.  Skin:    General: Skin is warm and dry.     Capillary Refill: Capillary refill takes less than 2 seconds.     Coloration: Skin is not jaundiced or pale.     Findings: No bruising, erythema, lesion or rash.  Neurological:     General: No focal deficit present.     Mental Status: She is alert and oriented to person, place, and time. Mental status is at baseline.  Psychiatric:        Mood and Affect: Mood normal.         Behavior: Behavior normal.        Thought Content: Thought content normal.        Judgment: Judgment normal.    Results for orders placed or performed in visit on 07/30/21  Rapid Strep screen([facility])   Specimen: Other   Other  Result Value Ref Range   Strep Gp A Ag, IA W/Reflex Positive (A) Negative      Assessment & Plan:   Problem List Items Addressed This Visit   None Visit Diagnoses     Strep throat    -  Primary   Will treat with augmentin. Call with any concerns. Continue to monitor.    Sore throat       + Strep   Relevant Orders   Novel Coronavirus, NAA ([facility])   Rapid Strep screen([facility]) (Completed)        Follow up plan: Return if symptoms worsen or fail to improve.

## 2021-08-01 LAB — NOVEL CORONAVIRUS, NAA: SARS-CoV-2, NAA: NOT DETECTED

## 2021-08-28 ENCOUNTER — Ambulatory Visit: Payer: Managed Care, Other (non HMO) | Admitting: Nurse Practitioner

## 2021-09-22 NOTE — Patient Instructions (Incomplete)
Saxenda (similar to [place], but daily injection), Contrave (oral)  Calorie Counting for Weight Loss Calories are units of energy. Your body needs a certain number of calories from food to keep going throughout the day. When you eat or drink more calories than your body needs, your body stores the extra calories mostly as fat. When you eat or drink fewer calories than your body needs, your body burns fat to get the energy it needs. Calorie counting means keeping track of how many calories you eat and drink each day. Calorie counting can be helpful if you need to lose weight. If you eat fewer calories than your body needs, you should lose weight. Ask your health care provider what a healthy weight is for you. For calorie counting to work, you will need to eat the right number of calories each day to lose a healthy amount of weight per week. A dietitian can help you figure out how many calories you need in a day and will suggest ways to reach your calorie goal. A healthy amount of weight to lose each week is usually 1-2 lb (0.5-0.9 kg). This usually means that your daily calorie intake should be reduced by 500-750 calories. Eating 1,200-1,500 calories a day can help most women lose weight. Eating 1,500-1,800 calories a day can help most men lose weight. What do I need to know about calorie counting? Work with your health care provider or dietitian to determine how many calories you should get each day. To meet your daily calorie goal, you will need to: Find out how many calories are in each food that you would like to eat. Try to do this before you eat. Decide how much of the food you plan to eat. Keep a food log. Do this by writing down what you ate and how many calories it had. To successfully lose weight, it is important to balance calorie counting with a healthy lifestyle that includes regular activity. Where do I find calorie information?  The number of calories in a food can be found on a  Nutrition Facts label. If a food does not have a Nutrition Facts label, try to look up the calories online or ask your dietitian for help. Remember that calories are listed per serving. If you choose to have more than one serving of a food, you will have to multiply the calories per serving by the number of servings you plan to eat. For example, the label on a package of bread might say that a serving size is 1 slice and that there are 90 calories in a serving. If you eat 1 slice, you will have eaten 90 calories. If you eat 2 slices, you will have eaten 180 calories. How do I keep a food log? After each time that you eat, record the following in your food log as soon as possible: What you ate. Be sure to include toppings, sauces, and other extras on the food. How much you ate. This can be measured in cups, ounces, or number of items. How many calories were in each food and drink. The total number of calories in the food you ate. Keep your food log near you, such as in a pocket-sized notebook or on an app or website on your mobile phone. Some programs will calculate calories for you and show you how many calories you have left to meet your daily goal. What are some portion-control tips? Know how many calories are in a serving. This will help you  know how many servings you can have of a certain food. Use a measuring cup to measure serving sizes. You could also try weighing out portions on a kitchen scale. With time, you will be able to estimate serving sizes for some foods. Take time to put servings of different foods on your favorite plates or in your favorite bowls and cups so you know what a serving looks like. Try not to eat straight from a food's packaging, such as from a bag or box. Eating straight from the package makes it hard to see how much you are eating and can lead to overeating. Put the amount you would like to eat in a cup or on a plate to make sure you are eating the right portion. Use  smaller plates, glasses, and bowls for smaller portions and to prevent overeating. Try not to multitask. For example, avoid watching TV or using your computer while eating. If it is time to eat, sit down at a table and enjoy your food. This will help you recognize when you are full. It will also help you be more mindful of what and how much you are eating. What are tips for following this plan? Reading food labels Check the calorie count compared with the serving size. The serving size may be smaller than what you are used to eating. Check the source of the calories. Try to choose foods that are high in protein, fiber, and vitamins, and low in saturated fat, trans fat, and sodium. Shopping Read nutrition labels while you shop. This will help you make healthy decisions about which foods to buy. Pay attention to nutrition labels for low-fat or fat-free foods. These foods sometimes have the same number of calories or more calories than the full-fat versions. They also often have added sugar, starch, or salt to make up for flavor that was removed with the fat. Make a grocery list of lower-calorie foods and stick to it. Cooking Try to cook your favorite foods in a healthier way. For example, try baking instead of frying. Use low-fat dairy products. Meal planning Use more fruits and vegetables. One-half of your plate should be fruits and vegetables. Include lean proteins, such as chicken, [place], and fish. Lifestyle Each week, aim to do one of the following: 150 minutes of moderate exercise, such as walking. 75 minutes of vigorous exercise, such as running. General information Know how many calories are in the foods you eat most often. This will help you calculate calorie counts faster. Find a way of tracking calories that works for you. Get creative. Try different apps or programs if writing down calories does not work for you. What foods should I eat?  Eat nutritious foods. It is better to have  a nutritious, high-calorie food, such as an avocado, than a food with few nutrients, such as a bag of potato chips. Use your calories on foods and drinks that will fill you up and will not leave you hungry soon after eating. Examples of foods that fill you up are nuts and nut butters, vegetables, lean proteins, and high-fiber foods such as whole grains. High-fiber foods are foods with more than 5 g of fiber per serving. Pay attention to calories in drinks. Low-calorie drinks include water and unsweetened drinks. The items listed above may not be a complete list of foods and beverages you can eat. Contact a dietitian for more information. What foods should I limit? Limit foods or drinks that are not good sources of vitamins, minerals,  or protein or that are high in unhealthy fats. These include: Candy. Other sweets. Sodas, specialty coffee drinks, alcohol, and juice. The items listed above may not be a complete list of foods and beverages you should avoid. Contact a dietitian for more information. How do I count calories when eating out? Pay attention to portions. Often, portions are much larger when eating out. Try these tips to keep portions smaller: Consider sharing a meal instead of getting your own. If you get your own meal, eat only half of it. Before you start eating, ask for a container and put half of your meal into it. When available, consider ordering smaller portions from the menu instead of full portions. Pay attention to your food and drink choices. Knowing the way food is cooked and what is included with the meal can help you eat fewer calories. If calories are listed on the menu, choose the lower-calorie options. Choose dishes that include vegetables, fruits, whole grains, low-fat dairy products, and lean proteins. Choose items that are boiled, broiled, grilled, or steamed. Avoid items that are buttered, battered, fried, or served with cream sauce. Items labeled as crispy are  usually fried, unless stated otherwise. Choose water, low-fat milk, unsweetened iced tea, or other drinks without added sugar. If you want an alcoholic beverage, choose a lower-calorie option, such as a glass of wine or light beer. Ask for dressings, sauces, and syrups on the side. These are usually high in calories, so you should limit the amount you eat. If you want a salad, choose a garden salad and ask for grilled meats. Avoid extra toppings such as bacon, cheese, or fried items. Ask for the dressing on the side, or ask for olive oil and vinegar or lemon to use as dressing. Estimate how many servings of a food you are given. Knowing serving sizes will help you be aware of how much food you are eating at restaurants. Where to find more information Centers for Disease Control and Prevention: http://www.wolf.info/ U.[place]. Department of Agriculture: http://www.wilson-mendoza.org/ Summary Calorie counting means keeping track of how many calories you eat and drink each day. If you eat fewer calories than your body needs, you should lose weight. A healthy amount of weight to lose per week is usually 1-2 lb (0.5-0.9 kg). This usually means reducing your daily calorie intake by 500-750 calories. The number of calories in a food can be found on a Nutrition Facts label. If a food does not have a Nutrition Facts label, try to look up the calories online or ask your dietitian for help. Use smaller plates, glasses, and bowls for smaller portions and to prevent overeating. Use your calories on foods and drinks that will fill you up and not leave you hungry shortly after a meal. This information is not intended to replace advice given to you by your health care provider. Make sure you discuss any questions you have with your health care provider. Document Revised: 04/08/2019 Document Reviewed: 04/08/2019 Elsevier Patient Education  [place].

## 2021-09-25 ENCOUNTER — Encounter: Payer: Self-pay | Admitting: Nurse Practitioner

## 2021-09-25 ENCOUNTER — Ambulatory Visit: Payer: Managed Care, Other (non HMO) | Admitting: Nurse Practitioner

## 2021-09-25 DIAGNOSIS — E669 Obesity, unspecified: Secondary | ICD-10-CM | POA: Diagnosis not present

## 2021-09-25 NOTE — Progress Notes (Signed)
BP 119/82   Pulse 81   Temp 98.2 F (36.8 C) (Oral)   Ht '5\' 4"'$  (1.626 m)   Wt 195 lb 3.2 oz (88.5 kg)   LMP 02/18/2012   SpO2 94%   BMI 33.51 kg/m    Subjective:    Patient ID: Shannon Marsh, female    DOB: 26-Sep-1965, 56 y.o.   MRN: [id number]  HPI: Shannon Marsh is a 56 y.o. female  Chief Complaint  Patient presents with   Weight Check    Patient is here for a Weight Check with [facility] prescription. Patient says she is actually not on the medication. Patient says she was informed even with her insurance, the prescription was still going to be $1500 a month.   WEIGHT CHECK: Presents today for weight check, ordered Wegovy last visit but was unable to attain due to cost.  Was going to be $1500 a month, as not at deductible for year.  Has been working on weight loss with diet changes and exercise (walking dog several times a day), has lost 15 pounds total at this time -- attempting not to snack as often.  She has been trying to lose weight for 6 months and feels stuck at current loss. Is exercising, walking.  Discussed weight loss options with her at length. No family history of thyroid cancer or personal history of pancreatitis.  Relevant past medical, surgical, family and social history reviewed and updated as indicated. Interim medical history since our last visit reviewed. Allergies and medications reviewed and updated.  Review of Systems  Constitutional:  Negative for activity change, appetite change, diaphoresis, fatigue and fever.  Respiratory:  Negative for cough, chest tightness and shortness of breath.   Cardiovascular:  Negative for chest pain, palpitations and leg swelling.  Gastrointestinal: Negative.   Neurological: Negative.   Psychiatric/Behavioral: Negative.      Per HPI unless specifically indicated above     Objective:    BP 119/82   Pulse 81   Temp 98.2 F (36.8 C) (Oral)   Ht '5\' 4"'$  (1.626 m)   Wt 195 lb 3.2 oz (88.5 kg)   LMP 02/18/2012    SpO2 94%   BMI 33.51 kg/m   Wt Readings from Last 3 Encounters:  09/25/21 195 lb 3.2 oz (88.5 kg)  07/30/21 198 lb (89.8 kg)  07/12/21 197 lb 9.6 oz (89.6 kg)    Physical Exam Vitals and nursing note reviewed.  Constitutional:      General: She is awake. She is not in acute distress.    Appearance: She is well-developed. She is obese. She is not ill-appearing.  HENT:     Head: Normocephalic.     Right Ear: Hearing normal.     Left Ear: Hearing normal.  Eyes:     General: Lids are normal.        Right eye: No discharge.        Left eye: No discharge.     Conjunctiva/sclera: Conjunctivae normal.     Pupils: Pupils are equal, round, and reactive to light.  Neck:     Thyroid: No thyromegaly.     Vascular: No carotid bruit.  Cardiovascular:     Rate and Rhythm: Normal rate and regular rhythm.     Heart sounds: Normal heart sounds. No murmur heard.    No gallop.  Pulmonary:     Effort: Pulmonary effort is normal. No accessory muscle usage or respiratory distress.     Breath sounds:  Normal breath sounds.  Abdominal:     General: Bowel sounds are normal.     Palpations: Abdomen is soft.  Musculoskeletal:     Cervical back: Normal range of motion and neck supple.     Right lower leg: No edema.     Left lower leg: No edema.  Skin:    General: Skin is warm and dry.  Neurological:     Mental Status: She is alert and oriented to person, place, and time.  Psychiatric:        Attention and Perception: Attention normal.        Mood and Affect: Mood normal.        Speech: Speech normal.        Behavior: Behavior normal. Behavior is cooperative.        Thought Content: Thought content normal.     Results for orders placed or performed in visit on 07/30/21  Novel Coronavirus, NAA ([facility])   Specimen: Saline  Result Value Ref Range   SARS-CoV-2, NAA Not Detected Not Detected  Rapid Strep screen([facility])   Specimen: Other   Other  Result Value Ref Range   Strep  Gp A Ag, IA W/Reflex Positive (A) Negative      Assessment & Plan:   Problem List Items Addressed This Visit       Other   Obesity (BMI 30.0-34.9)    BMI 33.51, has lost 15 pounds but is stuck.  Was unable to get [facility] due to cost.  Recommended eating smaller high protein, low fat meals more frequently and exercising 30 mins a day 5 times a week with a goal of 10-15lb weight loss in the next 3 months. Patient voiced their understanding and motivation to adhere to these recommendations.  Return in November for physical.         Follow up plan: Return in about 17 weeks (around 01/22/2022) for Annual physical -- after 01/12/22.

## 2021-09-25 NOTE — Assessment & Plan Note (Signed)
BMI 33.51, has lost 15 pounds but is stuck.  Was unable to get [facility] due to cost.  Recommended eating smaller high protein, low fat meals more frequently and exercising 30 mins a day 5 times a week with a goal of 10-15lb weight loss in the next 3 months. Patient voiced their understanding and motivation to adhere to these recommendations.  Return in November for physical.

## 2021-10-11 ENCOUNTER — Ambulatory Visit
Admission: RE | Admit: 2021-10-11 | Discharge: 2021-10-11 | Disposition: A | Discharge status: Home / Self Care | Payer: Managed Care, Other (non HMO) | Source: Ambulatory Visit | Attending: Nurse Practitioner | Admitting: Nurse Practitioner

## 2021-10-11 DIAGNOSIS — Z1231 Encounter for screening mammogram for malignant neoplasm of breast: Secondary | ICD-10-CM | POA: Insufficient documentation

## 2021-10-12 NOTE — Progress Notes (Signed)
Contacted via MyChart   Normal mammogram, may repeat in one year:)

## 2022-01-27 NOTE — Patient Instructions (Signed)

## 2022-01-29 ENCOUNTER — Ambulatory Visit (INDEPENDENT_AMBULATORY_CARE_PROVIDER_SITE_OTHER): Payer: Managed Care, Other (non HMO) | Admitting: Nurse Practitioner

## 2022-01-29 ENCOUNTER — Encounter: Payer: Self-pay | Admitting: Nurse Practitioner

## 2022-01-29 VITALS — BP 121/80 | HR 79 | Temp 98.2°F | Ht 64.02 in | Wt 201.2 lb

## 2022-01-29 DIAGNOSIS — K219 Gastro-esophageal reflux disease without esophagitis: Secondary | ICD-10-CM | POA: Diagnosis not present

## 2022-01-29 DIAGNOSIS — N6002 Solitary cyst of left breast: Secondary | ICD-10-CM

## 2022-01-29 DIAGNOSIS — I10 Essential (primary) hypertension: Secondary | ICD-10-CM

## 2022-01-29 DIAGNOSIS — Z23 Encounter for immunization: Secondary | ICD-10-CM

## 2022-01-29 DIAGNOSIS — Z638 Other specified problems related to primary support group: Secondary | ICD-10-CM | POA: Diagnosis not present

## 2022-01-29 DIAGNOSIS — E559 Vitamin D deficiency, unspecified: Secondary | ICD-10-CM

## 2022-01-29 DIAGNOSIS — Z0000 Encounter for general adult medical examination without abnormal findings: Secondary | ICD-10-CM | POA: Diagnosis not present

## 2022-01-29 DIAGNOSIS — E669 Obesity, unspecified: Secondary | ICD-10-CM

## 2022-01-29 DIAGNOSIS — E782 Mixed hyperlipidemia: Secondary | ICD-10-CM | POA: Diagnosis not present

## 2022-01-29 DIAGNOSIS — R7301 Impaired fasting glucose: Secondary | ICD-10-CM

## 2022-01-29 DIAGNOSIS — N951 Menopausal and female climacteric states: Secondary | ICD-10-CM

## 2022-01-29 NOTE — Assessment & Plan Note (Signed)
Chronic, stable.  BP at goal in office today. Continue Lisinopril at 40 MG and Amlodipine 10 MG daily as offering benefit. Recommend she monitor BP at least a few mornings a week at home and document.  DASH diet at home.  Labs today: CBC, CMP, TSH.  Urine ALB 30 last check, continue ACE for kidney protection.  Return in 6 months.

## 2022-01-29 NOTE — Progress Notes (Signed)
BP 121/80   Pulse 79   Temp 98.2 F (36.8 C) (Oral)   Ht 5' 4.02" (1.626 m)   Wt 201 lb 3.2 oz (91.3 kg)   LMP 02/18/2012   SpO2 98%   BMI 34.52 kg/m    Subjective:    Patient ID: Shannon Marsh, female    DOB: 02-17-1966, 56 y.o.   MRN: [id number]  HPI: Shannon Marsh is a 56 y.o. female presenting on 01/29/2022 for comprehensive medical examination. Current medical complaints include:none  She currently lives with: husband Menopausal Symptoms: yes  HYPERTENSION Continues on Lisinopril 40 MG and Amlodipine to 10 MG daily.  Rosuvastatin for HLD.  Has history of elevations in glucose on labs + history of gestational diabetes -- A1c one year ago 5.5%. Continues to take as needed Omeprazole for GERD with benefit -- has been taking a little more recently. Hypertension status: stable  Satisfied with current treatment? yes Duration of hypertension: chronic BP monitoring frequency: not checking BP range: not checking BP medication side effects:  no Medication compliance: good compliance Aspirin: no Recurrent headaches: no Visual changes: no Palpitations: no Dyspnea: no Chest pain: no Lower extremity edema: no Dizzy/lightheaded: no  The 10-year ASCVD risk score (Arnett DK, et al., 2019) is: 1.8%   Values used to calculate the score:     Age: 75 years     Sex: Female     Is Non-Hispanic African American: No     Diabetic: No     Tobacco smoker: No     Systolic Blood Pressure: 053 mmHg     Is BP treated: Yes     HDL Cholesterol: 56 mg/dL     Total Cholesterol: 137 mg/dL  DEPRESSION Continues on Sertraline 100 MG daily + Gabapentin 300 MG at bedtime for hot flashes with menopause taken.  Vitamin D continues for history of low levels. Mood status: stable Satisfied with current treatment?: yes Symptom severity: moderate  Duration of current treatment : chronic Side effects: no Medication compliance: good compliance Psychotherapy/counseling: none Depressed  mood: no Anxious mood: no Anhedonia: no Significant weight loss or gain: no Insomnia: no Fatigue: no Feelings of worthlessness or guilt: no Impaired concentration/indecisiveness: no Suicidal ideations: no Hopelessness: no Crying spells: no    01/29/2022    1:15 PM 09/25/2021    9:15 AM 07/12/2021    9:14 AM 01/12/2021   10:39 AM 08/29/2020   11:18 AM  Depression screen PHQ 2/9  Decreased Interest 0 0 0 1 0  Down, Depressed, Hopeless 0 0 0 1 0  PHQ - 2 Score 0 0 0 2 0  Altered sleeping 1 0 0 1 0  Tired, decreased energy 0 0 0 0 0  Change in appetite 1 0 '1 1 1  '$ Feeling bad or failure about yourself  0 0 0 0 0  Trouble concentrating 0 1 3 0 0  Moving slowly or fidgety/restless 0 0 0 0 0  Suicidal thoughts 0 0 0 0 0  PHQ-9 Score '2 1 4 4 1  '$ Difficult doing work/chores Not difficult at all Not difficult at all Somewhat difficult Not difficult at all       01/29/2022    1:15 PM 09/25/2021    9:16 AM 07/12/2021    9:14 AM 05/07/2019    2:14 PM  GAD 7 : Generalized Anxiety Score  Nervous, Anxious, on Edge 0 0 1 1  Control/stop worrying 0 0 0 0  Worry too much - different  things 0 0 1 1  Trouble relaxing 0 0 1 1  Restless 0 0 1 1  Easily annoyed or irritable 0 0 2 1  Afraid - awful might happen 0 0 1 1  Total GAD 7 Score 0 0 7 6  Anxiety Difficulty Not difficult at all Not difficult at all Somewhat difficult Not difficult at all      02/29/2020   11:13 AM 07/25/2020    8:30 AM 07/12/2021    9:13 AM 09/25/2021    9:15 AM 01/29/2022    1:15 PM  Fall Risk  Falls in the past year? 0  0 0 0  Was there an injury with Fall? 0  0 0 0  Fall Risk Category Calculator 0  0 0 0  Fall Risk Category Low  Low Low Low  Patient Fall Risk Level  Low fall risk Low fall risk Low fall risk   Patient at Risk for Falls Due to   No Fall Risks No Fall Risks No Fall Risks  Fall risk Follow up   Falls evaluation completed Falls evaluation completed Falls evaluation completed    Functional Status  Survey: Is the patient deaf or have difficulty hearing?: No Does the patient have difficulty seeing, even when wearing glasses/contacts?: No Does the patient have difficulty concentrating, remembering, or making decisions?: No Does the patient have difficulty walking or climbing stairs?: No Does the patient have difficulty dressing or bathing?: No Does the patient have difficulty doing errands alone such as visiting a doctor's office or shopping?: No   Past Medical History:  Past Medical History:  Diagnosis Date   Anemia    prior to hysterectomy   Bunion, right foot    Gall stones    GERD (gastroesophageal reflux disease)    with spicy foods   Hx gestational diabetes    Hypertension    Varicose vein of leg     Surgical History:  Past Surgical History:  Procedure Laterality Date   ABDOMINAL HYSTERECTOMY  03/23/2012   Procedure: HYSTERECTOMY ABDOMINAL;  Surgeon: Anastasio Auerbach, MD;  Location: [facility];  Service: Gynecology;  Laterality: N/A;  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   BILATERAL SALPINGECTOMY  03/23/2012   Procedure: BILATERAL SALPINGECTOMY;  Surgeon: Anastasio Auerbach, MD;  Location: [facility];  Service: Gynecology;  Laterality: Bilateral;   BREAST CYST ASPIRATION Left    neg   BUNIONECTOMY Left 03/25/2018   Procedure: LAPIDUS - TYPE LEFT;  Surgeon: Samara Deist, DPM;  Location: [facility];  Service: Podiatry;  Laterality: Left;  LAPIPLASTY SET GENERAL WITH LOCAL   BUNIONECTOMY Left    BUNIONECTOMY Right 02/10/2019   Procedure: LAPIDUS TYPE RIGHT;  Surgeon: Samara Deist, DPM;  Location: [place];  Service: Podiatry;  Laterality: Right;  general with local LAPIPLASTY SET   CHOLECYSTECTOMY  03/09/2014   TOTAL ABDOMINAL HYSTERECTOMY W/ BILATERAL SALPINGOOPHORECTOMY      Medications:  Current Outpatient Medications on File Prior to Visit  Medication Sig   Acetaminophen (TYLENOL 8 HOUR PO) Take by mouth as needed.    amLODipine (NORVASC) 10 MG tablet Take 1 tablet (10 mg total) by mouth daily.   cholecalciferol (VITAMIN D3) 25 MCG (1000 UNIT) tablet Take 1,000 Units by mouth daily.   gabapentin (NEURONTIN) 300 MG capsule Take 1 capsule (300 mg total) by mouth at bedtime.   lisinopril (ZESTRIL) 40 MG tablet Take 1 tablet (40 mg total) by mouth daily.   Melatonin 1 MG TABS Take 1 mg  by mouth as needed.   Multiple Vitamin (MULTIVITAMIN) tablet Take 1 tablet by mouth daily. Occasionally a chewable   omeprazole (PRILOSEC) 20 MG capsule Take 20 mg by mouth daily.   rosuvastatin (CRESTOR) 10 MG tablet Take 1 tablet (10 mg total) by mouth daily.   sertraline (ZOLOFT) 100 MG tablet Take 1 tablet (100 mg total) by mouth daily.   No current facility-administered medications on file prior to visit.    Allergies:  No Known Allergies  Social History:  Social History   Socioeconomic History   Marital status: Married    Spouse name: Not on file   Number of children: Not on file   Years of education: Not on file   Highest education level: Not on file  Occupational History   Not on file  Tobacco Use   Smoking status: Never   Smokeless tobacco: Never  Vaping Use   Vaping Use: Never used  Substance and Sexual Activity   Alcohol use: Yes    Alcohol/week: 3.0 standard drinks of alcohol    Types: 3 Standard drinks or equivalent per week   Drug use: No   Sexual activity: Yes    Partners: Male    Birth control/protection: Surgical    Comment: 1st intercourse 56 yo-Fewer than 5 partners  Other Topics Concern   Not on file  Social History Narrative   Not on file   Social Determinants of Health   Financial Resource Strain: Low Risk  (01/12/2021)   Overall Financial Resource Strain (CARDIA)    Difficulty of Paying Living Expenses: Not hard at all  Food Insecurity: No Food Insecurity (01/12/2021)   Hunger Vital Sign    Worried About Running Out of Food in the Last Year: Never true    [place] in the  Last Year: Never true  Transportation Needs: No Transportation Needs (01/12/2021)   PRAPARE - Hydrologist (Medical): No    Lack of Transportation (Non-Medical): No  Physical Activity: Insufficiently Active (01/12/2021)   Exercise Vital Sign    Days of Exercise per Week: 2 days    Minutes of Exercise per Session: 30 min  Stress: No Stress Concern Present (01/12/2021)   [place]    Feeling of Stress : Not at all  Social Connections: Moderately Integrated (01/12/2021)   Social Connection and Isolation Panel [NHANES]    Frequency of Communication with Friends and Family: More than three times a week    Frequency of Social Gatherings with Friends and Family: More than three times a week    Attends Religious Services: More than 4 times per year    Active Member of [employer] or Organizations: No    Attends Archivist Meetings: Never    Marital Status: Married  Human resources officer Violence: Not At Risk (01/12/2021)   Humiliation, Afraid, Rape, and Kick questionnaire    Fear of Current or Ex-Partner: No    Emotionally Abused: No    Physically Abused: No    Sexually Abused: No   Social History   Tobacco Use  Smoking Status Never  Smokeless Tobacco Never   Social History   Substance and Sexual Activity  Alcohol Use Yes   Alcohol/week: 3.0 standard drinks of alcohol   Types: 3 Standard drinks or equivalent per week    Family History:  Family History  Problem Relation Age of Onset   Diabetes Mother    Hypertension Mother  Dementia Mother    Diabetes Father    Heart disease Father    Hypertension Sister    Bullous pemphigoid Sister    Hypertension Brother    Hypertension Brother    Hypertension Brother    Heart attack Brother    Diabetes Brother    Cancer Maternal Grandfather        lung   Other Neg Hx    Breast cancer Neg Hx     Past medical history, surgical history,  medications, allergies, family history and social history reviewed with patient today and changes made to appropriate areas of the chart.   Review of Systems - negative All other ROS negative except what is listed above and in the HPI.      Objective:    BP 121/80   Pulse 79   Temp 98.2 F (36.8 C) (Oral)   Ht 5' 4.02" (1.626 m)   Wt 201 lb 3.2 oz (91.3 kg)   LMP 02/18/2012   SpO2 98%   BMI 34.52 kg/m   Wt Readings from Last 3 Encounters:  01/29/22 201 lb 3.2 oz (91.3 kg)  09/25/21 195 lb 3.2 oz (88.5 kg)  07/30/21 198 lb (89.8 kg)    Physical Exam Vitals and nursing note reviewed.  Constitutional:      General: She is awake. She is not in acute distress.    Appearance: She is well-developed and well-groomed. She is obese. She is not ill-appearing or toxic-appearing.  HENT:     Head: Normocephalic and atraumatic.     Right Ear: Hearing, tympanic membrane, ear canal and external ear normal. No drainage.     Left Ear: Hearing, tympanic membrane, ear canal and external ear normal. No drainage.     Nose: Nose normal.     Right Sinus: No maxillary sinus tenderness or frontal sinus tenderness.     Left Sinus: No maxillary sinus tenderness or frontal sinus tenderness.     Mouth/Throat:     Mouth: Mucous membranes are moist.     Pharynx: Oropharynx is clear. Uvula midline. No pharyngeal swelling, oropharyngeal exudate or posterior oropharyngeal erythema.  Eyes:     General: Lids are normal.        Right eye: No discharge.        Left eye: No discharge.     Extraocular Movements: Extraocular movements intact.     Conjunctiva/sclera: Conjunctivae normal.     Pupils: Pupils are equal, round, and reactive to light.     Visual Fields: Right eye visual fields normal and left eye visual fields normal.  Neck:     Thyroid: No thyromegaly.     Vascular: No carotid bruit.     Trachea: Trachea normal.  Cardiovascular:     Rate and Rhythm: Normal rate and regular rhythm.     Heart  sounds: Normal heart sounds. No murmur heard.    No gallop.  Pulmonary:     Effort: Pulmonary effort is normal. No accessory muscle usage or respiratory distress.     Breath sounds: Normal breath sounds.  Chest:  Breasts:    Right: Normal.     Left: Normal.  Abdominal:     General: Bowel sounds are normal.     Palpations: Abdomen is soft. There is no hepatomegaly or splenomegaly.     Tenderness: There is no abdominal tenderness.  Musculoskeletal:        General: Normal range of motion.     Cervical back: Normal range of motion  and neck supple.     Right lower leg: No edema.     Left lower leg: No edema.  Lymphadenopathy:     Head:     Right side of head: No submental, submandibular, tonsillar, preauricular or posterior auricular adenopathy.     Left side of head: No submental, submandibular, tonsillar, preauricular or posterior auricular adenopathy.     Cervical: No cervical adenopathy.     Upper Body:     Right upper body: No supraclavicular, axillary or pectoral adenopathy.     Left upper body: No supraclavicular, axillary or pectoral adenopathy.  Skin:    General: Skin is warm and dry.     Capillary Refill: Capillary refill takes less than 2 seconds.  Neurological:     Mental Status: She is alert and oriented to person, place, and time.     Gait: Gait is intact.     Deep Tendon Reflexes: Reflexes are normal and symmetric.     Reflex Scores:      Brachioradialis reflexes are 2+ on the right side and 2+ on the left side.      Patellar reflexes are 2+ on the right side and 2+ on the left side. Psychiatric:        Attention and Perception: Attention normal.        Mood and Affect: Mood normal.        Speech: Speech normal.        Behavior: Behavior normal. Behavior is cooperative.        Thought Content: Thought content normal.        Judgment: Judgment normal.    Results for orders placed or performed in visit on 07/30/21  Novel Coronavirus, NAA ([facility])   Specimen:  Saline  Result Value Ref Range   SARS-CoV-2, NAA Not Detected Not Detected  Rapid Strep screen([facility])   Specimen: Other   Other  Result Value Ref Range   Strep Gp A Ag, IA W/Reflex Positive (A) Negative      Assessment & Plan:   Problem List Items Addressed This Visit       Cardiovascular and Mediastinum   Benign hypertension - Primary    Chronic, stable.  BP at goal in office today. Continue Lisinopril at 40 MG and Amlodipine 10 MG daily as offering benefit. Recommend she monitor BP at least a few mornings a week at home and document.  DASH diet at home.  Labs today: CBC, CMP, TSH.  Urine ALB 30 last check, continue ACE for kidney protection.  Return in 6 months.       Relevant Orders   CBC with Differential/Platelet   Comprehensive metabolic panel   TSH   Hot flashes due to menopause    Stable with Gabapentin, has history of hysterectomy, but still has ovaries.  Continue Gabapentin 300 MG QHS is offering benefit to hot flashes at night and sleep pattern.  Recommend she continue light layers at bedtime and using fan.  Return to office in 6 months for chronic disease visit.        Digestive   Gastroesophageal reflux disease without esophagitis    Chronic, stable.  Continue Omeprazole as needed.  Mag level today. Risks of PPI use were discussed with patient including bone loss, C. Diff diarrhea, pneumonia, infections, CKD, electrolyte abnormalities.  Verbalizes understanding and chooses to continue the medication.       Relevant Orders   Magnesium     Endocrine   IFG (impaired fasting glucose)  History of gestational diabetes, check A1c today and yearly.  Recent A1c 5.5%.      Relevant Orders   HgB A1c     Other   Caregiver role strain    Chronic, stable with Sertraline at 100 MG.  Continue this current dose and will adjust in future, possibly reduce if improvement in symptoms and stressors.  Denies SI/HI.        Cyst of left breast    Continue yearly  mammograms.      Mixed hyperlipidemia    Chronic, ongoing.  Continue Rosuvastatin and adjust as needed.  Check lipid panel today.      Relevant Orders   Comprehensive metabolic panel   Lipid Panel w/o Chol/HDL Ratio   Obesity (BMI 30.0-34.9)    BMI 34.52.  Was unable to get [facility] due to cost.  Recommended eating smaller high protein, low fat meals more frequently and exercising 30 mins a day 5 times a week with a goal of 10-15lb weight loss in the next 3 months. Patient voiced their understanding and motivation to adhere to these recommendations.         Vitamin D deficiency    History of low levels, continues daily supplement.  Recheck level today and adjust regimen as needed.      Relevant Orders   VITAMIN D 25 Hydroxy (Vit-D Deficiency, Fractures)   Other Visit Diagnoses     Encounter for annual physical exam       Annual physical today with labs and health maintenance reviewed, discussed with patient.        Follow up plan: Return in about 6 months (around 07/30/2022) for HTN/HLD, MOOD.   LABORATORY TESTING:  - Pap smear: not applicable  IMMUNIZATIONS:   - Tdap: Tetanus vaccination status reviewed: last tetanus booster within 10 years. - Influenza: refuses today - Pneumovax: Not applicable - Prevnar: Not applicable - HPV: Not applicable - Zostavax vaccine: completed  SCREENING: -Mammogram: Up To Date 10/11/21 - Colonoscopy: Colonoscopy to be ordered next visit, had positive Cologuard in past and insurance is not going to cover colonoscopy until a year after this testing - Bone Density: Not applicable  -Hearing Test: Not applicable  -Spirometry: Not applicable   PATIENT COUNSELING:   Advised to take 1 mg of folate supplement per day if capable of pregnancy.   Sexuality: Discussed sexually transmitted diseases, partner selection, use of condoms, avoidance of unintended pregnancy  and contraceptive alternatives.   Advised to avoid cigarette smoking.  I  discussed with the patient that most people either abstain from alcohol or drink within safe limits (<=14/week and <=4 drinks/occasion for males, <=7/weeks and <= 3 drinks/occasion for females) and that the risk for alcohol disorders and other health effects rises proportionally with the number of drinks per week and how often a drinker exceeds daily limits.  Discussed cessation/primary prevention of drug use and availability of treatment for abuse.   Diet: Encouraged to adjust caloric intake to maintain  or achieve ideal body weight, to reduce intake of dietary saturated fat and total fat, to limit sodium intake by avoiding high sodium foods and not adding table salt, and to maintain adequate dietary potassium and calcium preferably from fresh fruits, vegetables, and low-fat dairy products.    Stressed the importance of regular exercise  Injury prevention: Discussed safety belts, safety helmets, smoke detector, smoking near bedding or upholstery.   Dental health: Discussed importance of regular tooth brushing, flossing, and dental visits.    NEXT  PREVENTATIVE PHYSICAL DUE IN 1 YEAR. Return in about 6 months (around 07/30/2022) for HTN/HLD, MOOD.

## 2022-01-29 NOTE — Assessment & Plan Note (Signed)
BMI 34.52.  Was unable to get [facility] due to cost.  Recommended eating smaller high protein, low fat meals more frequently and exercising 30 mins a day 5 times a week with a goal of 10-15lb weight loss in the next 3 months. Patient voiced their understanding and motivation to adhere to these recommendations.

## 2022-01-29 NOTE — Assessment & Plan Note (Signed)
Chronic, stable with Sertraline at 100 MG.  Continue this current dose and will adjust in future, possibly reduce if improvement in symptoms and stressors.  Denies SI/HI.

## 2022-01-29 NOTE — Assessment & Plan Note (Signed)
Chronic, stable.  Continue Omeprazole as needed.  Mag level today. Risks of PPI use were discussed with patient including bone loss, C. Diff diarrhea, pneumonia, infections, CKD, electrolyte abnormalities.  Verbalizes understanding and chooses to continue the medication.

## 2022-01-29 NOTE — Assessment & Plan Note (Signed)
History of low levels, continues daily supplement.  Recheck level today and adjust regimen as needed.

## 2022-01-29 NOTE — Assessment & Plan Note (Signed)
Continue yearly mammograms 

## 2022-01-29 NOTE — Assessment & Plan Note (Signed)
History of gestational diabetes, check A1c today and yearly.  Recent A1c 5.5%.

## 2022-01-29 NOTE — Assessment & Plan Note (Signed)
Stable with Gabapentin, has history of hysterectomy, but still has ovaries.  Continue Gabapentin 300 MG QHS is offering benefit to hot flashes at night and sleep pattern.  Recommend she continue light layers at bedtime and using fan.  Return to office in 6 months for chronic disease visit.

## 2022-01-29 NOTE — Assessment & Plan Note (Signed)
Chronic, ongoing.  Continue Rosuvastatin and adjust as needed.  Check lipid panel today.

## 2022-01-30 LAB — CBC WITH DIFFERENTIAL/PLATELET
Basophils Absolute: 0.1 10*3/uL (ref 0.0–0.2)
Basos: 1 %
EOS (ABSOLUTE): 0.2 10*3/uL (ref 0.0–0.4)
Eos: 3 %
Hematocrit: 40.7 % (ref 34.0–46.6)
Hemoglobin: 13.6 g/dL (ref 11.1–15.9)
Immature Grans (Abs): 0 10*3/uL (ref 0.0–0.1)
Immature Granulocytes: 0 %
Lymphocytes Absolute: 1.8 10*3/uL (ref 0.7–3.1)
Lymphs: 28 %
MCH: 29.2 pg (ref 26.6–33.0)
MCHC: 33.4 g/dL (ref 31.5–35.7)
MCV: 87 fL (ref 79–97)
Monocytes Absolute: 0.4 10*3/uL (ref 0.1–0.9)
Monocytes: 6 %
Neutrophils Absolute: 4.1 10*3/uL (ref 1.4–7.0)
Neutrophils: 62 %
Platelets: 321 10*3/uL (ref 150–450)
RBC: 4.66 x10E6/uL (ref 3.77–5.28)
RDW: 12.4 % (ref 11.7–15.4)
WBC: 6.6 10*3/uL (ref 3.4–10.8)

## 2022-01-30 LAB — COMPREHENSIVE METABOLIC PANEL
ALT: 13 IU/L (ref 0–32)
AST: 16 IU/L (ref 0–40)
Albumin/Globulin Ratio: 1.9 (ref 1.2–2.2)
Albumin: 4.4 g/dL (ref 3.8–4.9)
Alkaline Phosphatase: 117 IU/L (ref 44–121)
BUN/Creatinine Ratio: 29 — ABNORMAL HIGH (ref 9–23)
BUN: 18 mg/dL (ref 6–24)
Bilirubin Total: 0.4 mg/dL (ref 0.0–1.2)
CO2: 22 mmol/L (ref 20–29)
Calcium: 9.5 mg/dL (ref 8.7–10.2)
Chloride: 104 mmol/L (ref 96–106)
Creatinine, Ser: 0.63 mg/dL (ref 0.57–1.00)
Globulin, Total: 2.3 g/dL (ref 1.5–4.5)
Glucose: 96 mg/dL (ref 70–99)
Potassium: 4.4 mmol/L (ref 3.5–5.2)
Sodium: 141 mmol/L (ref 134–144)
Total Protein: 6.7 g/dL (ref 6.0–8.5)
eGFR: 104 mL/min/{1.73_m2} (ref >59)

## 2022-01-30 LAB — LIPID PANEL W/O CHOL/HDL RATIO
Cholesterol, Total: 161 mg/dL (ref 100–199)
HDL: 56 mg/dL (ref >39)
LDL Chol Calc (NIH): 73 mg/dL (ref 0–99)
Triglycerides: 196 mg/dL — ABNORMAL HIGH (ref 0–149)
VLDL Cholesterol Cal: 32 mg/dL (ref 5–40)

## 2022-01-30 LAB — TSH: TSH: 1.6 u[IU]/mL (ref 0.450–4.500)

## 2022-01-30 LAB — VITAMIN D 25 HYDROXY (VIT D DEFICIENCY, FRACTURES): Vit D, 25-Hydroxy: 40.3 ng/mL (ref 30.0–100.0)

## 2022-01-30 LAB — HEMOGLOBIN A1C
Est. average glucose Bld gHb Est-mCnc: 123 mg/dL
Hgb A1c MFr Bld: 5.9 % — ABNORMAL HIGH (ref 4.8–5.6)

## 2022-01-30 LAB — MAGNESIUM: Magnesium: 2.4 mg/dL — ABNORMAL HIGH (ref 1.6–2.3)

## 2022-01-30 NOTE — Progress Notes (Signed)
Contacted via MyChart   Good afternoon Shannon Marsh, your labs have returned: - Kidney function, creatinine and eGFR, remains normal, as is liver function, AST and ALT.  - Cholesterol levels stable, continue Rosuvastatin. - Magnesium level mild elevation, if taking supplement cut back to every other day. - CBC shows no anemia or infection and Vitamin D and thyroid lab (TSH) normal. - The A1C is the diabetes testing we talked about, this looks at your blood sugars over the past 3 months and turns the average into a number.  Your number is 5.9%, meaning you are prediabetic.  Any number 5.7 to 6.4 is considered prediabetes and any number 6.5 or greater is considered diabetes.   I would recommend heavy focus on decreasing foods high in sugar and your intake of things like bread products, pasta, and rice.  The American Diabetes Association online has a large amount of information on diet changes to make. We will recheck next visit.  Any questions? Keep being amazing!!  Thank you for allowing me to participate in your care.  I appreciate you. Kindest regards, Nadie Fiumara

## 2022-02-20 ENCOUNTER — Encounter: Payer: Self-pay | Admitting: Nurse Practitioner

## 2022-02-20 NOTE — Telephone Encounter (Signed)
Copied from [place] [phone] [id number]. Topic: Appointment Scheduling - Scheduling Inquiry for Clinic >> Feb 20, 2022 10:03 AM Chapman Fitch wrote: Reason for CRM: Pt has an appt for tomorrow but would like a call if anyone cancels an appt today / she is 5 minutes away from the office / please advise

## 2022-02-21 ENCOUNTER — Ambulatory Visit (INDEPENDENT_AMBULATORY_CARE_PROVIDER_SITE_OTHER): Payer: Managed Care, Other (non HMO) | Admitting: Physician Assistant

## 2022-02-21 ENCOUNTER — Encounter: Payer: Self-pay | Admitting: Physician Assistant

## 2022-02-21 VITALS — BP 132/86 | HR 87 | Temp 98.5°F | Wt 202.1 lb

## 2022-02-21 DIAGNOSIS — J019 Acute sinusitis, unspecified: Secondary | ICD-10-CM | POA: Diagnosis not present

## 2022-02-21 MED ORDER — CEFDINIR 300 MG PO CAPS: 300.0000 mg | ORAL_CAPSULE | Freq: Two times a day (BID) | ORAL | 0 refills | Status: AC

## 2022-02-21 NOTE — Patient Instructions (Addendum)
Based on your described symptoms and the duration of symptoms it is likely that you have a viral upper respiratory infection (often called a "cold")  Symptoms can last for 3-10 days with lingering cough and intermittent symptoms lasting weeks after that.  The goal of treatment at this time is to reduce your symptoms and discomfort   I have sent in a script for [facility] - this is an antibiotic and should be taken by mouth with food twice per day. Please finish the entire course unless you develop an allergic reaction.   If preferred you can use Coricidin to manage your symptoms rather than those medications mentioned above.  You can continue to take Mucinex, robitussin, and Tylenol to provide relief of your symptoms    If your symptoms do not improve or become worse in the next 5-7 days please make an apt at the office so we can see you  Go to the ER if you begin to have more serious symptoms such as shortness of breath, trouble breathing, loss of consciousness, swelling around the eyes, high fever, severe lasting headaches, vision changes or neck pain/stiffness.

## 2022-02-21 NOTE — Progress Notes (Signed)
Acute Office Visit   Patient: Shannon Marsh   DOB: 1965/05/08   56 y.o. Female  MRN: [id number] Visit Date: 02/21/2022  Today's healthcare provider: Dani Gobble Xayden Linsey, PA-C  Introduced myself to the patient as a Journalist, newspaper and provided education on APPs in clinical practice.    Chief Complaint  Patient presents with   URI    Pt states she has been having a cough, congestion, sinus pressure, body aches, and a headache for the past 6 days    Subjective    HPI HPI     URI    Additional comments: Pt states she has been having a cough, congestion, sinus pressure, body aches, and a headache for the past 6 days       Last edited by Georgina Peer, CMA on 02/21/2022  9:35 AM.        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   amLODipine (NORVASC) 10 MG tablet Take 1 tablet (10 mg total) by mouth daily.   cholecalciferol (VITAMIN D3) 25 MCG (1000 UNIT) tablet Take 1,000 Units by mouth daily.   gabapentin (NEURONTIN) 300 MG capsule Take 1 capsule (300 mg total) by mouth at bedtime.   lisinopril (ZESTRIL) 40 MG tablet Take 1 tablet (40 mg total) by mouth daily.   Melatonin 1 MG TABS Take 1 mg by mouth as needed.   Multiple Vitamin (MULTIVITAMIN) tablet Take 1 tablet by mouth daily. Occasionally a chewable   omeprazole (PRILOSEC) 20 MG capsule Take 20 mg by mouth daily.   rosuvastatin (CRESTOR) 10 MG tablet Take 1  tablet (10 mg total) by mouth daily.   sertraline (ZOLOFT) 100 MG tablet Take 1 tablet (100 mg total) by mouth daily.   No facility-administered medications prior to visit.    Review of Systems  Constitutional:  Positive for fatigue and fever. Negative for chills.  HENT:  Positive for congestion, postnasal drip and sinus pressure. Negative for ear pain and sore throat.   Respiratory:  Positive for cough and chest tightness. Negative for shortness of breath and wheezing.   Gastrointestinal:  Negative for diarrhea, nausea and vomiting.  Musculoskeletal:  Positive for myalgias.  Neurological:  Positive for headaches. Negative for dizziness and light-headedness.       Objective    BP 132/86   Pulse 87   Temp 98.5 F (36.9 C) (Oral)   Wt 202 lb 1.6 oz (91.7 kg)   LMP 02/18/2012   SpO2 97%   BMI 34.67 kg/m    Physical Exam Vitals reviewed.  Constitutional:      General: She is awake.     Appearance: Normal appearance. She is well-developed and well-groomed.  HENT:     Head: Normocephalic and atraumatic.     Right Ear: Tympanic membrane, ear canal and external ear normal.     Left Ear: Tympanic membrane, ear canal and external ear  normal.     Mouth/Throat:     Lips: Pink.     Tongue: No lesions.     Pharynx: Oropharynx is clear. Uvula midline. Posterior oropharyngeal erythema present. No oropharyngeal exudate or uvula swelling.     Tonsils: No tonsillar exudate or tonsillar abscesses.  Eyes:     General: Lids are normal. Gaze aligned appropriately.  Cardiovascular:     Rate and Rhythm: Normal rate and regular rhythm.     Pulses: Normal pulses.     Heart sounds: Normal heart sounds.  Pulmonary:     Effort: Pulmonary effort is normal.     Breath sounds: Normal breath sounds. No decreased air movement. No decreased breath sounds, wheezing, rhonchi or rales.  Musculoskeletal:     Cervical back: Normal range of motion and neck supple. No rigidity. Normal range of motion.   Lymphadenopathy:     Head:     Right side of head: No submental, submandibular or preauricular adenopathy.     Left side of head: No submental, submandibular or preauricular adenopathy.     Cervical:     Right cervical: No superficial or posterior cervical adenopathy.    Left cervical: No superficial or posterior cervical adenopathy.  Neurological:     Mental Status: She is alert.  Psychiatric:        Behavior: Behavior is cooperative.       No results found for any visits on 02/21/22.  Assessment & Plan      No follow-ups on file.      Problem List Items Addressed This Visit   None Visit Diagnoses     Acute sinusitis, recurrence not specified, unspecified location    -  Primary Acute, new concern Reports productive cough, sinus congestion, headaches, frontal sinus pressure for the last 6 days that is worsening and not improved with home measures Recommend she continue to use OTC multi]symptom medications to assist with symptom management Will send in script for Omnicef to assist with resolution of suspected bacterial sinusitis Reviewed return and ED precautions Follow up as needed for persistent or progressing symptoms.     Relevant Medications   cefdinir (OMNICEF) 300 MG capsule        No follow-ups on file.   I, Cayenne Breault E Rachel Rison, PA-C, have reviewed all documentation for this visit. The documentation on 02/21/22 for the exam, diagnosis, procedures, and orders are all accurate and complete.   Talitha Givens, MHS, PA-C [facility]

## 2022-03-07 ENCOUNTER — Encounter: Payer: Self-pay | Admitting: Physician Assistant

## 2022-03-07 ENCOUNTER — Ambulatory Visit (HOSPITAL_COMMUNITY)
Admission: RE | Admit: 2022-03-07 | Discharge: 2022-03-07 | Disposition: A | Discharge status: Home / Self Care | Payer: 59 | Source: Ambulatory Visit | Attending: Physician Assistant | Admitting: Physician Assistant

## 2022-03-07 ENCOUNTER — Ambulatory Visit: Payer: Managed Care, Other (non HMO) | Admitting: Physician Assistant

## 2022-03-07 VITALS — BP 127/81 | HR 105 | Temp 98.6°F | Wt 202.0 lb

## 2022-03-07 DIAGNOSIS — R051 Acute cough: Secondary | ICD-10-CM

## 2022-03-07 DIAGNOSIS — R0689 Other abnormalities of breathing: Secondary | ICD-10-CM

## 2022-03-07 MED ORDER — PREDNISONE 20 MG PO TABS: ORAL_TABLET | ORAL | 0 refills | Status: DC

## 2022-03-07 MED ORDER — PROMETHAZINE-DM 6.25-15 MG/5ML PO SYRP: 5.0000 mL | ORAL_SOLUTION | Freq: Four times a day (QID) | ORAL | 0 refills | Status: DC | PRN

## 2022-03-07 MED ORDER — AZITHROMYCIN 250 MG PO TABS: ORAL_TABLET | ORAL | 0 refills | Status: DC

## 2022-03-07 MED ORDER — AMOXICILLIN 500 MG PO CAPS: 1000.0000 mg | ORAL_CAPSULE | Freq: Three times a day (TID) | ORAL | 0 refills | Status: AC

## 2022-03-07 NOTE — Progress Notes (Signed)
Acute Office Visit   Patient: Shannon Marsh   DOB: 1965-03-13   56 y.o. Female  MRN: [id number] Visit Date: 03/07/2022  Today's healthcare provider: Dani Gobble Jeany Seville, PA-C  Introduced myself to the patient as a Journalist, newspaper and provided education on APPs in clinical practice.    Chief Complaint  Patient presents with   URI    Pt states she was seen recently and treated with an antibiotic, cefdinir. States she felt better for a couple of day after finishing the antibiotic but has started feeling bad again. States she feels like the congestion is in her chest and states she has a deep cough that is productive.    Subjective    URI  Associated symptoms include congestion, coughing, headaches, nausea and a sore throat. Pertinent negatives include no diarrhea, vomiting or wheezing.   HPI     URI    Additional comments: Pt states she was seen recently and treated with an antibiotic, cefdinir. States she felt better for a couple of day after finishing the antibiotic but has started feeling bad again. States she feels like the congestion is in her chest and states she has a deep cough that is productive.       Last edited by Georgina Peer, CMA on 03/07/2022  3:35 PM.      URI-type symptoms Onset: gradual  Duration: over 2 weeks- potential continuation of previous illness  Associated symptoms: reports chest congestion, productive cough of yellow-green mucus, fever (tmax 99-101), fatigue  She has tested twice for COVID and both tests were negative  Was previously treated with Cefdinir for acute sinusitis on 02/21/22 States she started to feel better for 2-3 days after she was treated with Cefdinir but then started to feel bad again and now feels even worse  Reports symptoms are worse at night  Interventions: coricidin cold and cough    Medications: Outpatient Medications Prior to Visit  Medication Sig   Acetaminophen (TYLENOL 8 HOUR PO) Take by mouth as needed.    amLODipine (NORVASC) 10 MG tablet Take 1 tablet (10 mg total) by mouth daily.   cholecalciferol (VITAMIN D3) 25 MCG (1000 UNIT) tablet Take 1,000 Units by mouth daily.   gabapentin (NEURONTIN) 300 MG capsule Take 1 capsule (300 mg total) by mouth at bedtime.   lisinopril (ZESTRIL) 40 MG tablet Take 1 tablet (40 mg total) by mouth daily.   Melatonin 1 MG TABS Take 1 mg by mouth as needed.   Multiple Vitamin (MULTIVITAMIN) tablet Take 1 tablet by mouth daily. Occasionally a chewable   omeprazole (PRILOSEC) 20 MG capsule Take 20 mg by mouth daily.   rosuvastatin (CRESTOR) 10 MG tablet Take 1 tablet (10 mg total) by mouth daily.   sertraline (ZOLOFT) 100 MG tablet Take 1 tablet (100 mg total) by mouth daily.   No facility-administered medications prior to visit.    Review of Systems  Constitutional:  Positive for chills, fatigue and fever.  HENT:  Positive for congestion, sinus pressure and sore throat.   Respiratory:  Positive for cough, chest tightness and shortness of breath. Negative for wheezing.   Gastrointestinal:  Positive for nausea. Negative for diarrhea and vomiting.  Neurological:  Positive for headaches.       Objective    BP 127/81   Pulse (!) 105   Temp 98.6 F (37 C) (Oral)   Wt 202 lb (91.6 kg)   LMP 02/18/2012   SpO2  98%   BMI 34.66 kg/m    Physical Exam Vitals reviewed.  Constitutional:      General: She is awake.     Appearance: Normal appearance. She is well-developed and well-groomed. She is ill-appearing and toxic-appearing.  HENT:     Head: Normocephalic and atraumatic.     Right Ear: Ear canal and external ear normal. Tympanic membrane is injected.     Left Ear: Tympanic membrane, ear canal and external ear normal.     Mouth/Throat:     Lips: Pink.     Mouth: Mucous membranes are moist.     Tongue: No lesions.     Pharynx: Oropharynx is clear. Uvula midline. No pharyngeal swelling, oropharyngeal exudate, posterior oropharyngeal erythema or uvula  swelling.     Tonsils: No tonsillar exudate or tonsillar abscesses.  Eyes:     General: Lids are normal. Gaze aligned appropriately. Allergic shiner present.     Extraocular Movements: Extraocular movements intact.     Conjunctiva/sclera: Conjunctivae normal.  Cardiovascular:     Rate and Rhythm: Normal rate and regular rhythm.     Heart sounds: Normal heart sounds. No murmur heard.    No friction rub. No gallop.  Pulmonary:     Breath sounds: Decreased air movement present. Examination of the right-middle field reveals decreased breath sounds. Decreased breath sounds present. No wheezing, rhonchi or rales.  Neurological:     General: No focal deficit present.     Mental Status: She is alert and oriented to person, place, and time.     GCS: GCS eye subscore is 4. GCS verbal subscore is 5. GCS motor subscore is 6.     Cranial Nerves: Cranial nerves 2-12 are intact.     Motor: Motor function is intact.  Psychiatric:        Mood and Affect: Mood normal.        Behavior: Behavior normal. Behavior is cooperative.        Thought Content: Thought content normal.        Judgment: Judgment normal.       No results found for any visits on 03/07/22.  Assessment & Plan      No follow-ups on file.      Problem List Items Addressed This Visit   None Visit Diagnoses     Decreased breath sounds in middle field on right side of chest    -  Primary Acute, ongoing concern Reports increased congestion, productive cough, SOB, fever and chills since she finished her first round of abx for suspected sinusitis Today there is reduced breath sounds to right lung field and patient appears toxic and ill in the room High concern for pneumonia but cannot rule out RSV at this time - unable to test for this at clinic CXR was negative for acute cardiopulmonary findings but given her symptoms and PE will proceed with CAP protocol along with Prednisone and Promethazine-DM to assist with symptoms Reviewed  ED and return precautions with patient and her husband- they voiced understanding  Follow up as needed for persistent or progressing symptoms.     Relevant Medications   amoxicillin (AMOXIL) 500 MG capsule   azithromycin (ZITHROMAX) 250 MG tablet   Other Relevant Orders   DG Chest 2 View (Completed)   Acute cough       Relevant Medications   predniSONE (DELTASONE) 20 MG tablet   promethazine-dextromethorphan (PROMETHAZINE-DM) 6.25-15 MG/5ML syrup        No follow-ups on file.  I, Elize Pinon E Ammara Raj, PA-C, have reviewed all documentation for this visit. The documentation on 03/07/22 for the exam, diagnosis, procedures, and orders are all accurate and complete.   Talitha Givens, MHS, PA-C [facility]

## 2022-03-07 NOTE — Patient Instructions (Addendum)
   For your symptoms I recommend that we get a chest xray to check for pneumonia  I will send in a script for two antibiotics for pneumonia  Amoxicillin 500 mg - take two tablets three times per day by mouth for 7 days  Azithromycin 250 mg - this is a Zpack - please follow the directions included in your script.  Please finish the entire course of antibiotics even if you start to feel better before they are complete I will also send you in a script for Prednisone  Please start the Prednisone tomorrow morning as taking it late in the day can cause sleeplessness  I have sent in a script for a cough syrup that will help with the coughing and nausea. Please try to take this at night as it can be sedating  Please go here for your  xray   [place] [employer] [place], [place] [id number]

## 2022-03-28 ENCOUNTER — Ambulatory Visit: Payer: Self-pay | Admitting: *Deleted

## 2022-03-28 NOTE — Telephone Encounter (Signed)
Summary: sinus congestion, chills, body aches.   Patient called in dx with covid today, sinus congestion, chills, body aches. She has questions about what to do, should she quarentine and take meds. Please call back           Chief Complaint: tested positive for covid today Symptoms: sinus congestion, coughing productive yellow green mucus at times. Coughing spell last night. Body aches, chills. Hx HTN Frequency: sx started last night  Pertinent Negatives: Patient denies chest pain no difficulty breathing no fever. Disposition: '[]'$ ED /'[]'$ Urgent Care (no appt availability in office) / '[]'$ Appointment(In office/virtual)/ '[]'$  [place] [facility]/ '[]'$ Home Care/ '[]'$ Refused Recommended Disposition /'[]'$ [facility]/ '[x]'$  Follow-up with PCP Additional Notes:   Offered appt via My Chart VV tomorrow. Patient reports she has appt with her work that she thinks can prescribe medication. Recommended patient to call back if issue with appt and we can schedule appt.  Patient requesting to notify PCP due her and her husband covid positive patients of PCP      Reason for Disposition  [1] HIGH RISK patient (e.g., weak immune system, age > 2 years, obesity with BMI 30 or higher, pregnant, chronic lung disease or other chronic medical condition) AND [2] COVID symptoms (e.g., cough, fever)  (Exceptions: Already seen by PCP and no new or worsening symptoms.)  Answer Assessment - Initial Assessment Questions 1. COVID-19 DIAGNOSIS: "How do you know that you have COVID?" (e.g., positive lab test or self-test, diagnosed by doctor or NP/PA, symptoms after exposure).     At home covid test today positive  2. COVID-19 EXPOSURE: "Was there any known exposure to COVID before the symptoms began?" CDC Definition of close contact: within 6 feet (2 meters) for a total of 15 minutes or more over a 24-hour period.      Husband  3. ONSET: "When did the COVID-19 symptoms start?"      Last night 03/27/22 4. WORST  SYMPTOM: "What is your worst symptom?" (e.g., cough, fever, shortness of breath, muscle aches)     Cough, body aches, chills, productive cough at times yellow green in color 5. COUGH: "Do you have a cough?" If Yes, ask: "How bad is the cough?"       Yes coughing spells at night  6. FEVER: "Do you have a fever?" If Yes, ask: "What is your temperature, how was it measured, and when did it start?"     Chills  7. RESPIRATORY STATUS: "Describe your breathing?" (e.g., normal; shortness of breath, wheezing, unable to speak)      normal 8. BETTER-SAME-WORSE: "Are you getting better, staying the same or getting worse compared to yesterday?"  If getting worse, ask, "In what way?"     Getting worse 9. OTHER SYMPTOMS: "Do you have any other symptoms?"  (e.g., chills, fatigue, headache, loss of smell or taste, muscle pain, sore throat)     Chills, body aches. Coughing sinus con 10. HIGH RISK DISEASE: "Do you have any chronic medical problems?" (e.g., asthma, heart or lung disease, weak immune system, obesity, etc.)       Hx HTN 11. VACCINE: "Have you had the COVID-19 vaccine?" If Yes, ask: "Which one, how many shots, when did you get it?"       na 12. PREGNANCY: "Is there any chance you are pregnant?" "When was your last menstrual period?"       na 13. O2 SATURATION MONITOR:  "Do you use an oxygen saturation monitor (pulse oximeter) at home?" If  Yes, ask "What is your reading (oxygen level) today?" "What is your usual oxygen saturation reading?" (e.g., 95%)       na  Protocols used: Coronavirus (COVID-19) Diagnosed or Suspected-A-AH

## 2022-03-29 ENCOUNTER — Telehealth: Payer: Managed Care, Other (non HMO) | Admitting: Nurse Practitioner

## 2022-03-29 NOTE — Telephone Encounter (Signed)
Noted  

## 2022-03-29 NOTE — Telephone Encounter (Signed)
Pt scheduled  

## 2022-04-01 ENCOUNTER — Telehealth: Payer: Self-pay | Admitting: Nurse Practitioner

## 2022-04-01 NOTE — Telephone Encounter (Signed)
Noted, thank you

## 2022-04-01 NOTE — Telephone Encounter (Signed)
Copied from [place] [phone]. 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

## 2022-07-27 NOTE — Patient Instructions (Signed)
Be Involved in Your Health Care:  Taking Medications When medications are taken as directed, they can greatly improve your health. But if they are not taken as instructed, they may not work. In some cases, not taking them correctly can be harmful. To help ensure your treatment remains effective and safe, understand your medications and how to take them.  Your lab results, notes and after visit summary will be available on My Chart. We strongly encourage you to use this feature. If lab results are abnormal the clinic will contact you with the appropriate steps. If the clinic does not contact you assume the results are satisfactory. You can always see your results on My Chart. If you have questions regarding your condition, please contact the clinic during office hours. You can also ask questions on My Chart.  We at [facility] are grateful that you chose us to provide care. We strive to provide excellent and compassionate care and are always looking for feedback. If you get a survey from the clinic please complete this.   DASH Eating Plan DASH stands for Dietary Approaches to Stop Hypertension. The DASH eating plan is a healthy eating plan that has been shown to: Reduce high blood pressure (hypertension). Reduce your risk for type 2 diabetes, heart disease, and stroke. Help with weight loss. What are tips for following this plan? Reading food labels Check food labels for the amount of salt (sodium) per serving. Choose foods with less than 5 percent of the Daily Value of sodium. Generally, foods with less than 300 milligrams (mg) of sodium per serving fit into this eating plan. To find whole grains, look for the word "whole" as the first word in the ingredient list. Shopping Buy products labeled as "low-sodium" or "no salt added." Buy fresh foods. Avoid canned foods and pre-made or frozen meals. Cooking Avoid adding salt when cooking. Use salt-free seasonings or herbs instead of  table salt or sea salt. Check with your health care provider or pharmacist before using salt substitutes. Do not fry foods. Cook foods using healthy methods such as baking, boiling, grilling, roasting, and broiling instead. Cook with heart-healthy oils, such as olive, canola, avocado, soybean, or sunflower oil. Meal planning  Eat a balanced diet that includes: 4 or more servings of fruits and 4 or more servings of vegetables each day. Try to fill one-half of your plate with fruits and vegetables. 6-8 servings of whole grains each day. Less than 6 oz (170 g) of lean meat, poultry, or fish each day. A 3-oz (85-g) serving of meat is about the same size as a deck of cards. One egg equals 1 oz (28 g). 2-3 servings of low-fat dairy each day. One serving is 1 cup (237 mL). 1 serving of nuts, seeds, or beans 5 times each week. 2-3 servings of heart-healthy fats. Healthy fats called omega-3 fatty acids are found in foods such as walnuts, flaxseeds, fortified milks, and eggs. These fats are also found in cold-water fish, such as sardines, salmon, and mackerel. Limit how much you eat of: Canned or prepackaged foods. Food that is high in trans fat, such as some fried foods. Food that is high in saturated fat, such as fatty meat. Desserts and other sweets, sugary drinks, and other foods with added sugar. Full-fat dairy products. Do not salt foods before eating. Do not eat more than 4 egg yolks a week. Try to eat at least 2 vegetarian meals a week. Eat more home-cooked food and less restaurant,   buffet, and fast food. Lifestyle When eating at a restaurant, ask that your food be prepared with less salt or no salt, if possible. If you drink alcohol: Limit how much you use to: 0-1 drink a day for women who are not pregnant. 0-2 drinks a day for men. Be aware of how much alcohol is in your drink. In the [place] one drink equals one 12 oz bottle of beer (355 mL), one 5 oz glass of wine (148 mL), or one 1 oz  glass of hard liquor (44 mL). General information Avoid eating more than 2,300 mg of salt a day. If you have hypertension, you may need to reduce your sodium intake to 1,500 mg a day. Work with your health care provider to maintain a healthy body weight or to lose weight. Ask what an ideal weight is for you. Get at least 30 minutes of exercise that causes your heart to beat faster (aerobic exercise) most days of the week. Activities may include walking, swimming, or biking. Work with your health care provider or dietitian to adjust your eating plan to your individual calorie needs. What foods should I eat? Fruits All fresh, dried, or frozen fruit. Canned fruit in natural juice (without added sugar). Vegetables Fresh or frozen vegetables (raw, steamed, roasted, or grilled). Low-sodium or reduced-sodium tomato and vegetable juice. Low-sodium or reduced-sodium tomato sauce and tomato paste. Low-sodium or reduced-sodium canned vegetables. Grains Whole-grain or whole-wheat bread. Whole-grain or whole-wheat pasta. Brown rice. Oatmeal. Quinoa. Bulgur. Whole-grain and low-sodium cereals. Pita bread. Low-fat, low-sodium crackers. Whole-wheat flour tortillas. Meats and other proteins Skinless chicken or turkey. Ground chicken or turkey. Pork with fat trimmed off. Fish and seafood. Egg whites. Dried beans, peas, or lentils. Unsalted nuts, nut butters, and seeds. Unsalted canned beans. Lean cuts of beef with fat trimmed off. Low-sodium, lean precooked or cured meat, such as sausages or meat loaves. Dairy Low-fat (1%) or fat-free (skim) milk. Reduced-fat, low-fat, or fat-free cheeses. Nonfat, low-sodium ricotta or cottage cheese. Low-fat or nonfat yogurt. Low-fat, low-sodium cheese. Fats and oils Soft margarine without trans fats. Vegetable oil. Reduced-fat, low-fat, or light mayonnaise and salad dressings (reduced-sodium). Canola, safflower, olive, avocado, soybean, and sunflower oils. Avocado. Seasonings and  condiments Herbs. Spices. Seasoning mixes without salt. Other foods Unsalted popcorn and pretzels. Fat-free sweets. The items listed above may not be a complete list of foods and beverages you can eat. Contact a dietitian for more information. What foods should I avoid? Fruits Canned fruit in a light or heavy syrup. Fried fruit. Fruit in cream or butter sauce. Vegetables Creamed or fried vegetables. Vegetables in a cheese sauce. Regular canned vegetables (not low-sodium or reduced-sodium). Regular canned tomato sauce and paste (not low-sodium or reduced-sodium). Regular tomato and vegetable juice (not low-sodium or reduced-sodium). Pickles. Olives. Grains Baked goods made with fat, such as croissants, muffins, or some breads. Dry pasta or rice meal packs. Meats and other proteins Fatty cuts of meat. Ribs. Fried meat. Bacon. Bologna, salami, and other precooked or cured meats, such as sausages or meat loaves. Fat from the back of a pig (fatback). Bratwurst. Salted nuts and seeds. Canned beans with added salt. Canned or smoked fish. Whole eggs or egg yolks. Chicken or turkey with skin. Dairy Whole or 2% milk, cream, and half-and-half. Whole or full-fat cream cheese. Whole-fat or sweetened yogurt. Full-fat cheese. Nondairy creamers. Whipped toppings. Processed cheese and cheese spreads. Fats and oils Butter. Stick margarine. Lard. Shortening. Ghee. Bacon fat. Tropical oils, such as coconut, palm   kernel, or palm oil. Seasonings and condiments Onion salt, garlic salt, seasoned salt, table salt, and sea salt. Worcestershire sauce. Tartar sauce. Barbecue sauce. Teriyaki sauce. Soy sauce, including reduced-sodium. Steak sauce. Canned and packaged gravies. Fish sauce. Oyster sauce. Cocktail sauce. Store-bought horseradish. Ketchup. Mustard. Meat flavorings and tenderizers. Bouillon cubes. Hot sauces. Pre-made or packaged marinades. Pre-made or packaged taco seasonings. Relishes. Regular salad  dressings. Other foods Salted popcorn and pretzels. The items listed above may not be a complete list of foods and beverages you should avoid. Contact a dietitian for more information. Where to find more information National Heart, Lung, and Blood Institute: www.nhlbi.nih.gov American Heart Association: www.heart.org Academy of Nutrition and Dietetics: www.eatright.org National Kidney Foundation: www.kidney.org Summary The DASH eating plan is a healthy eating plan that has been shown to reduce high blood pressure (hypertension). It may also reduce your risk for type 2 diabetes, heart disease, and stroke. When on the DASH eating plan, aim to eat more fresh fruits and vegetables, whole grains, lean proteins, low-fat dairy, and heart-healthy fats. With the DASH eating plan, you should limit salt (sodium) intake to 2,300 mg a day. If you have hypertension, you may need to reduce your sodium intake to 1,500 mg a day. Work with your health care provider or dietitian to adjust your eating plan to your individual calorie needs. This information is not intended to replace advice given to you by your health care provider. Make sure you discuss any questions you have with your health care provider. Document Revised: 01/29/2019 Document Reviewed: 01/29/2019 Elsevier Patient Education  2023 Elsevier Inc.  

## 2022-07-30 ENCOUNTER — Encounter: Payer: Self-pay | Admitting: Nurse Practitioner

## 2022-07-30 ENCOUNTER — Ambulatory Visit: Payer: Managed Care, Other (non HMO) | Admitting: Nurse Practitioner

## 2022-07-30 VITALS — BP 118/82 | HR 85 | Temp 98.1°F | Ht 64.02 in | Wt 198.6 lb

## 2022-07-30 DIAGNOSIS — E782 Mixed hyperlipidemia: Secondary | ICD-10-CM

## 2022-07-30 DIAGNOSIS — R7301 Impaired fasting glucose: Secondary | ICD-10-CM

## 2022-07-30 DIAGNOSIS — Z1211 Encounter for screening for malignant neoplasm of colon: Secondary | ICD-10-CM

## 2022-07-30 DIAGNOSIS — Z638 Other specified problems related to primary support group: Secondary | ICD-10-CM | POA: Diagnosis not present

## 2022-07-30 DIAGNOSIS — I10 Essential (primary) hypertension: Secondary | ICD-10-CM | POA: Diagnosis not present

## 2022-07-30 DIAGNOSIS — N951 Menopausal and female climacteric states: Secondary | ICD-10-CM

## 2022-07-30 DIAGNOSIS — E669 Obesity, unspecified: Secondary | ICD-10-CM

## 2022-07-30 MED ORDER — GABAPENTIN 100 MG PO CAPS: 100.0000 mg | ORAL_CAPSULE | Freq: Every day | ORAL | 0 refills | Status: DC

## 2022-07-30 MED ORDER — LISINOPRIL 40 MG PO TABS: 40.0000 mg | ORAL_TABLET | Freq: Every day | ORAL | 4 refills | Status: DC

## 2022-07-30 MED ORDER — AMLODIPINE BESYLATE 10 MG PO TABS: 10.0000 mg | ORAL_TABLET | Freq: Every day | ORAL | 4 refills | Status: DC

## 2022-07-30 MED ORDER — SERTRALINE HCL 100 MG PO TABS
100.0000 mg | ORAL_TABLET | Freq: Every day | ORAL | 4 refills | Status: DC
Start: 2022-07-30 — End: 2023-09-10

## 2022-07-30 MED ORDER — ROSUVASTATIN CALCIUM 10 MG PO TABS
10.0000 mg | ORAL_TABLET | Freq: Every day | ORAL | 4 refills | Status: DC
Start: 2022-07-30 — End: 2023-09-12

## 2022-07-30 NOTE — Assessment & Plan Note (Signed)
Stable with Gabapentin, has history of hysterectomy, but still has ovaries.  Reduce Gabapentin to 100 MG QHS is offering benefit to hot flashes at night and sleep pattern.  Recommend she continue light layers at bedtime and using fan.  Return to office in 6 months.  May reduce further if overall stable.

## 2022-07-30 NOTE — Progress Notes (Signed)
BP 118/82   Pulse 85   Temp 98.1 F (36.7 C) (Oral)   Ht 5' 4.02" (1.626 m)   Wt 198 lb 9.6 oz (90.1 kg)   LMP 02/18/2012   SpO2 98%   BMI 34.07 kg/m    Subjective:    Patient ID: Shannon Marsh, female    DOB: [birth year], 57 y.o.   MRN: [id number]  HPI: Shannon Marsh is a 57 y.o. female  Chief Complaint  Patient presents with   Hypertension   Hyperlipidemia   Mood   HYPERTENSION / HYPERLIPIDEMIA Taking Amlodipine 10 MG daily, Lisinopril 40 MG daily, and Crestor 10 MG daily.  Has been working on weight loss, has lost 4 pounds at this time.  She has been trying to lose weight for 6 months with diet and exercise.  Is exercising, walking.  Discussed weight loss options with her at length.  No family history of thyroid cancer or personal history of pancreatitis.  We attempted to get [facility] in past and this is not covered.  A1c last visit 5.9% with no symptoms.   Satisfied with current treatment? yes Duration of hypertension: chronic BP monitoring frequency: occasional BP range: yesterday was in 110/80 range BP medication side effects: no Duration of hyperlipidemia: chronic Cholesterol medication side effects: no Cholesterol supplements: none Medication compliance: good compliance Aspirin: no Recent stressors: no Recurrent headaches: no Visual changes: no Palpitations: no Dyspnea: no Chest pain: no Lower extremity edema: no Dizzy/lightheaded: no The 10-year ASCVD risk score (Arnett DK, et al., 2019) is: 2%   Values used to calculate the score:     Age: 39 years     Sex: Female     Is Non-Hispanic African American: No     Diabetic: No     Tobacco smoker: No     Systolic Blood Pressure: 118 mmHg     Is BP treated: Yes     HDL Cholesterol: 56 mg/dL     Total Cholesterol: 161 mg/dL  DEPRESSION Continues on Zoloft 100 MG daily. Is on Gabapentin for hot flashes which offers benefit, would like to cut back on this dosing.   Mood status:  controlled Satisfied with current treatment?: yes Symptom severity: mild  Duration of current treatment : chronic Side effects: no Medication compliance: good compliance Psychotherapy/counseling: none Depressed mood: no Anxious mood: no Anhedonia: no Significant weight loss or gain: no Insomnia: no Fatigue: no Feelings of worthlessness or guilt: no Impaired concentration/indecisiveness: no Suicidal ideations: no Hopelessness: no Crying spells: no    07/30/2022   10:25 AM 01/29/2022    1:15 PM 09/25/2021    9:15 AM 07/12/2021    9:14 AM 01/12/2021   10:39 AM  Depression screen PHQ 2/9  Decreased Interest 0 0 0 0 1  Down, Depressed, Hopeless 0 0 0 0 1  PHQ - 2 Score 0 0 0 0 2  Altered sleeping 1 1 0 0 1  Tired, decreased energy 0 0 0 0 0  Change in appetite 0 1 0 1 1  Feeling bad or failure about yourself  0 0 0 0 0  Trouble concentrating 0 0 1 3 0  Moving slowly or fidgety/restless 0 0 0 0 0  Suicidal thoughts 0 0 0 0 0  PHQ-9 Score 1 2 1 4 4   Difficult doing work/chores Not difficult at all Not difficult at all Not difficult at all Somewhat difficult Not difficult at all       07/30/2022  10:25 AM 01/29/2022    1:15 PM 09/25/2021    9:16 AM 07/12/2021    9:14 AM  GAD 7 : Generalized Anxiety Score  Nervous, Anxious, on Edge 0 0 0 1  Control/stop worrying 0 0 0 0  Worry too much - different things 0 0 0 1  Trouble relaxing 0 0 0 1  Restless 0 0 0 1  Easily annoyed or irritable 0 0 0 2  Afraid - awful might happen 0 0 0 1  Total GAD 7 Score 0 0 0 7  Anxiety Difficulty Not difficult at all Not difficult at all Not difficult at all Somewhat difficult    Relevant past medical, surgical, family and social history reviewed and updated as indicated. Interim medical history since our last visit reviewed. Allergies and medications reviewed and updated.  Review of Systems  Constitutional:  Negative for activity change, appetite change, diaphoresis, fatigue and fever.   Respiratory:  Negative for cough, chest tightness and shortness of breath.   Cardiovascular:  Negative for chest pain, palpitations and leg swelling.  Gastrointestinal: Negative.   Neurological: Negative.   Psychiatric/Behavioral: Negative.      Per HPI unless specifically indicated above     Objective:    BP 118/82   Pulse 85   Temp 98.1 F (36.7 C) (Oral)   Ht 5' 4.02" (1.626 m)   Wt 198 lb 9.6 oz (90.1 kg)   LMP 02/18/2012   SpO2 98%   BMI 34.07 kg/m   Wt Readings from Last 3 Encounters:  07/30/22 198 lb 9.6 oz (90.1 kg)  03/07/22 202 lb (91.6 kg)  02/21/22 202 lb 1.6 oz (91.7 kg)    Physical Exam Vitals and nursing note reviewed.  Constitutional:      General: She is awake. She is not in acute distress.    Appearance: She is well-developed. She is obese. She is not ill-appearing.  HENT:     Head: Normocephalic.     Right Ear: Hearing normal.     Left Ear: Hearing normal.  Eyes:     General: Lids are normal.        Right eye: No discharge.        Left eye: No discharge.     Conjunctiva/sclera: Conjunctivae normal.     Pupils: Pupils are equal, round, and reactive to light.  Neck:     Thyroid: No thyromegaly.     Vascular: No carotid bruit.  Cardiovascular:     Rate and Rhythm: Normal rate and regular rhythm.     Heart sounds: Normal heart sounds. No murmur heard.    No gallop.  Pulmonary:     Effort: Pulmonary effort is normal. No accessory muscle usage or respiratory distress.     Breath sounds: Normal breath sounds.  Abdominal:     General: Bowel sounds are normal.     Palpations: Abdomen is soft.  Musculoskeletal:     Cervical back: Normal range of motion and neck supple.     Right lower leg: No edema.     Left lower leg: No edema.  Skin:    General: Skin is warm and dry.  Neurological:     Mental Status: She is alert and oriented to person, place, and time.  Psychiatric:        Attention and Perception: Attention normal.        Mood and  Affect: Mood normal.        Speech: Speech normal.  Behavior: Behavior normal. Behavior is cooperative.        Thought Content: Thought content normal.    Results for orders placed or performed in visit on 01/29/22  CBC with Differential/Platelet  Result Value Ref Range   WBC 6.6 3.4 - 10.8 x10E3/uL   RBC 4.66 3.77 - 5.28 x10E6/uL   Hemoglobin 13.6 11.1 - 15.9 g/dL   Hematocrit 65.7 84.6 - 46.6 %   MCV 87 79 - 97 fL   MCH 29.2 26.6 - 33.0 pg   MCHC 33.4 31.5 - 35.7 g/dL   RDW 96.2 95.2 - 84.1 %   Platelets 321 150 - 450 x10E3/uL   Neutrophils 62 Not Estab. %   Lymphs 28 Not Estab. %   Monocytes 6 Not Estab. %   Eos 3 Not Estab. %   Basos 1 Not Estab. %   Neutrophils Absolute 4.1 1.4 - 7.0 x10E3/uL   Lymphocytes Absolute 1.8 0.7 - 3.1 x10E3/uL   Monocytes Absolute 0.4 0.1 - 0.9 x10E3/uL   EOS (ABSOLUTE) 0.2 0.0 - 0.4 x10E3/uL   Basophils Absolute 0.1 0.0 - 0.2 x10E3/uL   Immature Granulocytes 0 Not Estab. %   Immature Grans (Abs) 0.0 0.0 - 0.1 x10E3/uL  Comprehensive metabolic panel  Result Value Ref Range   Glucose 96 70 - 99 mg/dL   BUN 18 6 - 24 mg/dL   Creatinine, Ser 3.24 0.57 - 1.00 mg/dL   eGFR 401 >02 VO/ZDG/6.44   BUN/Creatinine Ratio 29 (H) 9 - 23   Sodium 141 134 - 144 mmol/L   Potassium 4.4 3.5 - 5.2 mmol/L   Chloride 104 96 - 106 mmol/L   CO2 22 20 - 29 mmol/L   Calcium 9.5 8.7 - 10.2 mg/dL   Total Protein 6.7 6.0 - 8.5 g/dL   Albumin 4.4 3.8 - 4.9 g/dL   Globulin, Total 2.3 1.5 - 4.5 g/dL   Albumin/Globulin Ratio 1.9 1.2 - 2.2   Bilirubin Total 0.4 0.0 - 1.2 mg/dL   Alkaline Phosphatase 117 44 - 121 IU/L   AST 16 0 - 40 IU/L   ALT 13 0 - 32 IU/L  Lipid Panel w/o Chol/HDL Ratio  Result Value Ref Range   Cholesterol, Total 161 100 - 199 mg/dL   Triglycerides 034 (H) 0 - 149 mg/dL   HDL 56 >74 mg/dL   VLDL Cholesterol Cal 32 5 - 40 mg/dL   LDL Chol Calc (NIH) 73 0 - 99 mg/dL  TSH  Result Value Ref Range   TSH 1.600 0.450 - 4.500 uIU/mL   VITAMIN D 25 Hydroxy (Vit-D Deficiency, Fractures)  Result Value Ref Range   Vit D, 25-Hydroxy 40.3 30.0 - 100.0 ng/mL  Magnesium  Result Value Ref Range   Magnesium 2.4 (H) 1.6 - 2.3 mg/dL  HgB Q5Z  Result Value Ref Range   Hgb A1c MFr Bld 5.9 (H) 4.8 - 5.6 %   Est. average glucose Bld gHb Est-mCnc 123 mg/dL      Assessment & Plan:   Problem List Items Addressed This Visit       Cardiovascular and Mediastinum   Benign hypertension - Primary    Chronic, stable.  BP well at goal in office today. Continue Lisinopril at 40 MG and Amlodipine 10 MG daily as offering benefit. Recommend she monitor BP at least a few mornings a week at home and document.  DASH diet at home.  Labs today: BMP.  Urine ALB 30 last check, continue ACE for kidney protection.  Return in 6 months.       Relevant Medications   rosuvastatin (CRESTOR) 10 MG tablet   lisinopril (ZESTRIL) 40 MG tablet   amLODipine (NORVASC) 10 MG tablet   Other Relevant Orders   Basic metabolic panel   Hot flashes due to menopause    Stable with Gabapentin, has history of hysterectomy, but still has ovaries.  Reduce Gabapentin to 100 MG QHS is offering benefit to hot flashes at night and sleep pattern.  Recommend she continue light layers at bedtime and using fan.  Return to office in 6 months.  May reduce further if overall stable.      Relevant Medications   rosuvastatin (CRESTOR) 10 MG tablet   lisinopril (ZESTRIL) 40 MG tablet   amLODipine (NORVASC) 10 MG tablet     Endocrine   IFG (impaired fasting glucose)    History of gestational diabetes, check A1c today and yearly.  Recent A1c 5.9%.      Relevant Orders   HgB A1c     Other   Caregiver role strain    Chronic, stable with Sertraline at 100 MG.  Continue this current dose and will adjust in future, possibly reduce if improvement in symptoms and stressors.  Denies SI/HI.        Mixed hyperlipidemia    Chronic, ongoing.  Continue Rosuvastatin and adjust as  needed.  Check lipid panel today.      Relevant Medications   rosuvastatin (CRESTOR) 10 MG tablet   lisinopril (ZESTRIL) 40 MG tablet   amLODipine (NORVASC) 10 MG tablet   Other Relevant Orders   Lipid Panel w/o Chol/HDL Ratio   Obesity (BMI 30.0-34.9)    BMI 34.07.  Was unable to get [facility] due to cost.  Recommended eating smaller high protein, low fat meals more frequently and exercising 30 mins a day 5 times a week with a goal of 10-15lb weight loss in the next 3 months. Patient voiced their understanding and motivation to adhere to these recommendations.         Other Visit Diagnoses     Colon cancer screening       Referral to GI, last had a positive Cologuard in 2022.   Relevant Orders   Ambulatory referral to Gastroenterology        Follow up plan: Return in about 6 months (around 01/30/2023) for Annual Physical after 01/30/23.

## 2022-07-30 NOTE — Assessment & Plan Note (Signed)
Chronic, stable with Sertraline at 100 MG.  Continue this current dose and will adjust in future, possibly reduce if improvement in symptoms and stressors.  Denies SI/HI.   

## 2022-07-30 NOTE — Assessment & Plan Note (Signed)
History of gestational diabetes, check A1c today and yearly.  Recent A1c 5.9%.

## 2022-07-30 NOTE — Assessment & Plan Note (Signed)
BMI 34.07.  Was unable to get [facility] due to cost.  Recommended eating smaller high protein, low fat meals more frequently and exercising 30 mins a day 5 times a week with a goal of 10-15lb weight loss in the next 3 months. Patient voiced their understanding and motivation to adhere to these recommendations.

## 2022-07-30 NOTE — Assessment & Plan Note (Signed)
Chronic, ongoing.  Continue Rosuvastatin and adjust as needed.  Check lipid panel today. 

## 2022-07-30 NOTE — Assessment & Plan Note (Signed)
Chronic, stable.  BP well at goal in office today. Continue Lisinopril at 40 MG and Amlodipine 10 MG daily as offering benefit. Recommend she monitor BP at least a few mornings a week at home and document.  DASH diet at home.  Labs today: BMP.  Urine ALB 30 last check, continue ACE for kidney protection.  Return in 6 months.

## 2022-07-31 ENCOUNTER — Other Ambulatory Visit: Payer: Self-pay

## 2022-07-31 ENCOUNTER — Telehealth: Payer: Self-pay

## 2022-07-31 DIAGNOSIS — Z1211 Encounter for screening for malignant neoplasm of colon: Secondary | ICD-10-CM

## 2022-07-31 LAB — LIPID PANEL W/O CHOL/HDL RATIO
Cholesterol, Total: 159 mg/dL (ref 100–199)
HDL: 58 mg/dL (ref >39)
LDL Chol Calc (NIH): 78 mg/dL (ref 0–99)
Triglycerides: 129 mg/dL (ref 0–149)
VLDL Cholesterol Cal: 23 mg/dL (ref 5–40)

## 2022-07-31 LAB — BASIC METABOLIC PANEL
BUN/Creatinine Ratio: 21 (ref 9–23)
BUN: 15 mg/dL (ref 6–24)
CO2: 22 mmol/L (ref 20–29)
Calcium: 9.7 mg/dL (ref 8.7–10.2)
Chloride: 107 mmol/L — ABNORMAL HIGH (ref 96–106)
Creatinine, Ser: 0.72 mg/dL (ref 0.57–1.00)
Glucose: 108 mg/dL — ABNORMAL HIGH (ref 70–99)
Potassium: 4.5 mmol/L (ref 3.5–5.2)
Sodium: 144 mmol/L (ref 134–144)
eGFR: 98 mL/min/{1.73_m2} (ref >59)

## 2022-07-31 LAB — HEMOGLOBIN A1C
Est. average glucose Bld gHb Est-mCnc: 131 mg/dL
Hgb A1c MFr Bld: 6.2 % — ABNORMAL HIGH (ref 4.8–5.6)

## 2022-07-31 MED ORDER — NA SULFATE-K SULFATE-MG SULF 17.5-3.13-1.6 GM/177ML PO SOLN: 1.0000 | Freq: Once | ORAL | 0 refills | Status: AC

## 2022-07-31 NOTE — Telephone Encounter (Signed)
Gastroenterology Pre-Procedure Review  Request Date: 10/01/22 Requesting Physician: Dr. Allegra Lai  PATIENT REVIEW QUESTIONS: The patient responded to the following health history questions as indicated:    1. Are you having any GI issues? no 2. Do you have a personal history of Polyps? no 3. Do you have a family history of Colon Cancer or Polyps? yes (brother colon polyps) 4. Diabetes Mellitus? no 5. Joint replacements in the past 12 months?no 6. Major health problems in the past 3 months?no 7. Any artificial heart valves, MVP, or defibrillator?no    MEDICATIONS & ALLERGIES:    Patient reports the following regarding taking any anticoagulation/antiplatelet therapy:   Plavix, Coumadin, Eliquis, Xarelto, Lovenox, Pradaxa, Brilinta, or Effient? no Aspirin? no  Patient confirms/reports the following medications:  Current Outpatient Medications  Medication Sig Dispense Refill   Na Sulfate-K Sulfate-Mg Sulf 17.5-3.13-1.6 GM/177ML SOLN Take 1 kit by mouth once for 1 dose. 354 mL 0   Acetaminophen (TYLENOL 8 HOUR PO) Take by mouth as needed.     amLODipine (NORVASC) 10 MG tablet Take 1 tablet (10 mg total) by mouth daily. 90 tablet 4   cholecalciferol (VITAMIN D3) 25 MCG (1000 UNIT) tablet Take 1,000 Units by mouth daily.     gabapentin (NEURONTIN) 100 MG capsule Take 1 capsule (100 mg total) by mouth at bedtime. 60 capsule 0   lisinopril (ZESTRIL) 40 MG tablet Take 1 tablet (40 mg total) by mouth daily. 90 tablet 4   Melatonin 1 MG TABS Take 1 mg by mouth as needed.     Multiple Vitamin (MULTIVITAMIN) tablet Take 1 tablet by mouth daily. Occasionally a chewable     omeprazole (PRILOSEC) 20 MG capsule Take 20 mg by mouth daily.     rosuvastatin (CRESTOR) 10 MG tablet Take 1 tablet (10 mg total) by mouth daily. 90 tablet 4   sertraline (ZOLOFT) 100 MG tablet Take 1 tablet (100 mg total) by mouth daily. 90 tablet 4   No current facility-administered medications for this visit.    Patient  confirms/reports the following allergies:  No Known Allergies  No orders of the defined types were placed in this encounter.   AUTHORIZATION INFORMATION Primary Insurance: 1D#: Group #:  Secondary Insurance: 1D#: Group #:  SCHEDULE INFORMATION: Date: 10/01/22 Time: Location: [facility]

## 2022-07-31 NOTE — Progress Notes (Signed)
Contacted via MyChart   Good morning Shannon Marsh, your labs have returned: - Kidney function, creatinine and eGFR, remains normal. - Cholesterol levels stable, continue Rosuvastatin 10 MG daily. - The A1c is the diabetes testing we talked about, this looks at your blood sugars over the past 3 months and turns the average into a number.  Your number is 6.2%, meaning you are prediabetic.  Any number 5.7 to 6.4 is considered prediabetes and any number 6.5 or greater is considered diabetes.   I would recommend heavy focus on decreasing foods high in sugar and your intake of things like bread products, pasta, and rice.  The American Diabetes Association online has a large amount of information on diet changes to make.  We will recheck this number in 3 months to ensure you are not continuing to trend upwards and move into diabetes.  Have a good day. Your previous check on this was 5.9%, so you are trending up some.  Any questions? Keep being stellar!!  Thank you for allowing me to participate in your care.  I appreciate you. Kindest regards, Keeara Frees

## 2022-09-23 ENCOUNTER — Encounter: Payer: Self-pay | Admitting: Gastroenterology

## 2022-09-24 ENCOUNTER — Encounter: Payer: Self-pay | Admitting: Gastroenterology

## 2022-09-24 NOTE — Anesthesia Preprocedure Evaluation (Addendum)
Anesthesia Evaluation  Patient identified by MRN, date of birth, ID band Patient awake    Reviewed: Allergy & Precautions, NPO status , Patient's Chart, lab work & pertinent test results  History of Anesthesia Complications Negative for: history of anesthetic complications  Airway Mallampati: IV   Neck ROM: Full    Dental   Crown; small chip on front tooth:   Pulmonary neg pulmonary ROS   Pulmonary exam normal breath sounds clear to auscultation       Cardiovascular hypertension, Normal cardiovascular exam Rhythm:Regular Rate:Normal     Neuro/Psych negative neurological ROS     GI/Hepatic ,GERD  ,,  Endo/Other  Obesity, prediabetes  Renal/GU negative Renal ROS     Musculoskeletal   Abdominal   Peds  Hematology  (+) Blood dyscrasia, anemia   Anesthesia Other Findings Apparently hx of PSVT in 2021, but no echo and cannot find note on it, but it's in the "chart review" as a primary diagnosis  Reproductive/Obstetrics                             Anesthesia Physical Anesthesia Plan  ASA: 2  Anesthesia Plan: General   Post-op Pain Management:    Induction: Intravenous  PONV Risk Score and Plan: 3 and Propofol infusion, TIVA and Treatment may vary due to age or medical condition  Airway Management Planned: Natural Airway  Additional Equipment:   Intra-op Plan:   Post-operative Plan:   Informed Consent: I have reviewed the patients History and Physical, chart, labs and discussed the procedure including the risks, benefits and alternatives for the proposed anesthesia with the patient or authorized representative who has indicated his/her understanding and acceptance.       Plan Discussed with: CRNA  Anesthesia Plan Comments: (LMA/GETA backup discussed.  Patient consented for risks of anesthesia including but not limited to:  - adverse reactions to medications - damage to eyes,  teeth, lips or other oral mucosa - nerve damage due to positioning  - sore throat or hoarseness - damage to heart, brain, nerves, lungs, other parts of body or loss of life  Informed patient about role of CRNA in peri- and intra-operative care.  Patient voiced understanding.)        Anesthesia Quick Evaluation

## 2022-10-01 ENCOUNTER — Ambulatory Visit: Payer: Managed Care, Other (non HMO) | Admitting: Anesthesiology

## 2022-10-01 ENCOUNTER — Other Ambulatory Visit: Payer: Self-pay

## 2022-10-01 ENCOUNTER — Encounter: Payer: Self-pay | Admitting: Gastroenterology

## 2022-10-01 ENCOUNTER — Ambulatory Visit
Admission: RE | Admit: 2022-10-01 | Discharge: 2022-10-01 | Disposition: A | Discharge status: Home / Self Care | Payer: Managed Care, Other (non HMO) | Attending: Gastroenterology | Admitting: Gastroenterology

## 2022-10-01 ENCOUNTER — Encounter
Admission: RE | Disposition: A | Discharge status: Home / Self Care | Payer: Self-pay | Source: Home / Self Care | Attending: Gastroenterology

## 2022-10-01 DIAGNOSIS — Z1211 Encounter for screening for malignant neoplasm of colon: Secondary | ICD-10-CM | POA: Diagnosis not present

## 2022-10-01 DIAGNOSIS — D123 Benign neoplasm of transverse colon: Secondary | ICD-10-CM | POA: Diagnosis not present

## 2022-10-01 DIAGNOSIS — K5730 Diverticulosis of large intestine without perforation or abscess without bleeding: Secondary | ICD-10-CM | POA: Diagnosis not present

## 2022-10-01 DIAGNOSIS — K635 Polyp of colon: Secondary | ICD-10-CM

## 2022-10-01 HISTORY — DX: Prediabetes: R73.03

## 2022-10-01 HISTORY — PX: POLYPECTOMY: SHX5525

## 2022-10-01 HISTORY — DX: Obesity, class 1: E66.811

## 2022-10-01 HISTORY — PX: COLONOSCOPY WITH PROPOFOL: SHX5780

## 2022-10-01 HISTORY — DX: Obesity, unspecified: E66.9

## 2022-10-01 SURGERY — COLONOSCOPY WITH PROPOFOL
Anesthesia: General

## 2022-10-01 MED ORDER — PROPOFOL 10 MG/ML IV BOLUS
INTRAVENOUS | Status: DC | PRN
  Administered 2022-10-01: 100 mg via INTRAVENOUS

## 2022-10-01 MED ORDER — ONDANSETRON HCL 4 MG/2ML IJ SOLN: 4.0000 mg | Freq: Once | INTRAMUSCULAR | Status: DC | PRN

## 2022-10-01 MED ORDER — LIDOCAINE HCL (CARDIAC) PF 100 MG/5ML IV SOSY
PREFILLED_SYRINGE | INTRAVENOUS | Status: DC | PRN
  Administered 2022-10-01: 100 mg via INTRAVENOUS

## 2022-10-01 MED ORDER — STERILE WATER FOR IRRIGATION IR SOLN
Status: DC | PRN
  Administered 2022-10-01: 500 mL

## 2022-10-01 MED ORDER — ACETAMINOPHEN 325 MG PO TABS: 650.0000 mg | ORAL_TABLET | Freq: Once | ORAL | Status: DC | PRN

## 2022-10-01 MED ORDER — STERILE WATER FOR IRRIGATION IR SOLN
Status: DC | PRN
  Administered 2022-10-01: 120 mL

## 2022-10-01 MED ORDER — ACETAMINOPHEN 160 MG/5ML PO SOLN: 325.0000 mg | ORAL | Status: DC | PRN

## 2022-10-01 MED ORDER — LACTATED RINGERS IV SOLN: INTRAVENOUS | Status: DC

## 2022-10-01 MED ORDER — PROPOFOL 500 MG/50ML IV EMUL
INTRAVENOUS | Status: DC | PRN
  Administered 2022-10-01: 160 ug/kg/min via INTRAVENOUS

## 2022-10-01 SURGICAL SUPPLY — 27 items
CLIP HMST 235XBRD CATH ROT (MISCELLANEOUS) IMPLANT
CLIP RESOLUTION 360 11X235 (MISCELLANEOUS) ×4
ELECT REM PT RETURN 9FT ADLT (ELECTROSURGICAL) ×2
ELECTRODE REM PT RTRN 9FT ADLT (ELECTROSURGICAL) IMPLANT
FCP ESCP3.2XJMB 240X2.8X (MISCELLANEOUS)
FORCEPS BIOP RAD 4 LRG CAP 4 (CUTTING FORCEPS) IMPLANT
FORCEPS BIOP RJ4 240 W/NDL (MISCELLANEOUS)
FORCEPS ESCP3.2XJMB 240X2.8X (MISCELLANEOUS) IMPLANT
GOWN CVR UNV OPN BCK APRN NK (MISCELLANEOUS) ×4 IMPLANT
GOWN ISOL THUMB LOOP REG UNIV (MISCELLANEOUS) ×4
INJECTABLE ELEVIEW COMP 10 (MISCELLANEOUS) IMPLANT
INJECTOR VARIJECT VIN23 (MISCELLANEOUS) IMPLANT
KIT DEFENDO VALVE AND CONN (KITS) IMPLANT
KIT PRC NS LF DISP ENDO (KITS) ×2 IMPLANT
KIT PROCEDURE OLYMPUS (KITS) ×2
MANIFOLD NEPTUNE II (INSTRUMENTS) ×2 IMPLANT
MARKER SPOT ENDO TATTOO 5ML (MISCELLANEOUS) IMPLANT
PROBE APC STR FIRE (PROBE) IMPLANT
RETRIEVER NET ROTH 2.5X230 LF (MISCELLANEOUS) IMPLANT
SNARE COLD EXACTO (MISCELLANEOUS) IMPLANT
SNARE LASSO HEX 3 IN 1 (INSTRUMENTS) IMPLANT
SNARE SHORT THROW 13M SML OVAL (MISCELLANEOUS) IMPLANT
SNARE SHORT THROW 30M LRG OVAL (MISCELLANEOUS) IMPLANT
SNARE SNG USE RND 15MM (INSTRUMENTS) IMPLANT
TRAP ETRAP POLY (MISCELLANEOUS) IMPLANT
VARIJECT INJECTOR VIN23 (MISCELLANEOUS) ×2
WATER STERILE IRR 250ML POUR (IV SOLUTION) ×2 IMPLANT

## 2022-10-01 NOTE — Anesthesia Postprocedure Evaluation (Signed)
Anesthesia Post Note  Patient: Shannon Marsh  Procedure(s) Performed: COLONOSCOPY WITH PROPOFOL POLYPECTOMY  Patient location during evaluation: PACU Anesthesia Type: General Level of consciousness: awake and alert, oriented and patient cooperative Pain management: pain level controlled Vital Signs Assessment: post-procedure vital signs reviewed and stable Respiratory status: spontaneous breathing, nonlabored ventilation and respiratory function stable Cardiovascular status: blood pressure returned to baseline and stable Postop Assessment: adequate PO intake Anesthetic complications: no   No notable events documented.   Last Vitals:  Vitals:   10/01/22 0911 10/01/22 0915  BP: 103/81 108/77  Pulse: 68 68  Resp: 11 16  Temp: 36.7 C   SpO2: 96% 96%    Last Pain:  Vitals:   10/01/22 0915  TempSrc:   PainSc: 0-No pain                 Reed Breech

## 2022-10-01 NOTE — Op Note (Signed)
[facility] Patient Name: Shannon Marsh Procedure Date: 10/01/2022 8:08 AM MRN: [id number] Account #: [id number] Date of Birth: 07/19/1965 Admit Type: Outpatient Age: 57 Room: [facility] OR ROOM 01 Gender: Female Note Status: Finalized Instrument Name: [id number] Procedure:             Colonoscopy Indications:           Screening for colorectal malignant neoplasm, This is                         the patient's first colonoscopy Providers:             Toney Reil MD, MD Referring MD:          Dorie Rank. Harvest Dark (Referring MD) Medicines:             General Anesthesia Complications:         No immediate complications. Estimated blood loss: None. Procedure:             Pre-Anesthesia Assessment:                        - Prior to the procedure, a History and Physical was                         performed, and patient medications and allergies were                         reviewed. The patient is competent. The risks and                         benefits of the procedure and the sedation options and                         risks were discussed with the patient. All questions                         were answered and informed consent was obtained.                         Patient identification and proposed procedure were                         verified by the physician, the nurse, the                         anesthesiologist, the anesthetist and the technician                         in the pre-procedure area in the procedure room in the                         endoscopy suite. Mental Status Examination: alert and                         oriented. Airway Examination: normal oropharyngeal                         airway and neck mobility. Respiratory Examination:  clear to auscultation. CV Examination: normal.                         Prophylactic Antibiotics: The patient does not require                         prophylactic  antibiotics. Prior Anticoagulants: The                         patient has taken no anticoagulant or antiplatelet                         agents. ASA Grade Assessment: II - A patient with mild                         systemic disease. After reviewing the risks and                         benefits, the patient was deemed in satisfactory                         condition to undergo the procedure. The anesthesia                         plan was to use general anesthesia. Immediately prior                         to administration of medications, the patient was                         re-assessed for adequacy to receive sedatives. The                         heart rate, respiratory rate, oxygen saturations,                         blood pressure, adequacy of pulmonary ventilation, and                         response to care were monitored throughout the                         procedure. The physical status of the patient was                         re-assessed after the procedure.                        After obtaining informed consent, the colonoscope was                         passed under direct vision. Throughout the procedure,                         the patient's blood pressure, pulse, and oxygen                         saturations were monitored continuously. The  Colonoscope was introduced through the anus and                         advanced to the the cecum, identified by appendiceal                         orifice and ileocecal valve. The colonoscopy was                         performed without difficulty. The patient tolerated                         the procedure well. The quality of the bowel                         preparation was evaluated using the BBPS [facility] Bowel                         Preparation Scale) with scores of: Right Colon = 3,                         Transverse Colon = 3 and Left Colon = 3 (entire mucosa                         seen  well with no residual staining, small fragments                         of stool or opaque liquid). The total BBPS score                         equals 9. The ileocecal valve, appendiceal orifice,                         and rectum were photographed. Findings:      The perianal and digital rectal examinations were normal. Pertinent       negatives include normal sphincter tone and no palpable rectal lesions.      Two sessile polyps were found in the ascending colon and cecum. The       polyps were diminutive in size. These polyps were removed with a cold       biopsy forceps. Resection and retrieval were complete.      A 20 mm polyp was found in the transverse colon. The polyp was flat.       Preparations were made for mucosal resection. Demarcation of the lesion       was performed with narrow band imaging to clearly identify the       boundaries of the lesion. Eleview was injected to raise the lesion.       Snare mucosal resection was performed. Resection and retrieval were       complete. Resected tissue including tissue margins will be examined by       histology. To prevent bleeding after mucosal resection, two hemostatic       clips were successfully placed (MR safe). Clip manufacturer: [employer]. There was no bleeding during, or at the end, of the       procedure.      A 4 mm polyp was  found in the sigmoid colon. The polyp was sessile. The       polyp was removed with a cold snare. Resection and retrieval were       complete.      Multiple large-mouthed diverticula were found in the recto-sigmoid colon       and sigmoid colon.      The retroflexed view of the distal rectum and anal verge was normal and       showed no anal or rectal abnormalities. Impression:            - Two diminutive polyps in the ascending colon and in                         the cecum, removed with a cold biopsy forceps.                         Resected and retrieved.                        -  One 20 mm polyp in the transverse colon, removed                         with mucosal resection. Resected and retrieved. Clip                         manufacturer: [employer]. Clips (MR safe) were                         placed.                        - One 4 mm polyp in the sigmoid colon, removed with a                         cold snare. Resected and retrieved.                        - Diverticulosis in the recto-sigmoid colon and in the                         sigmoid colon.                        - The distal rectum and anal verge are normal on                         retroflexion view.                        - Mucosal resection was performed. Resection and                         retrieval were complete. Recommendation:        - Discharge patient to home (with escort).                        - Resume previous diet today.                        - Continue present medications.                        -  Await pathology results.                        - Repeat colonoscopy in 3 years or sooner for                         surveillance based on pathology results. Procedure Code(s):     --- Professional ---                        [phone], Colonoscopy, flexible; with endoscopic mucosal                         resection                        [phone], 59, Colonoscopy, flexible; with removal of                         tumor(s), polyp(s), or other lesion(s) by snare                         technique                        [id number], 59, Colonoscopy, flexible; with biopsy, single                         or multiple Diagnosis Code(s):     --- Professional ---                        Z12.11, Encounter for screening for malignant neoplasm                         of colon                        D12.2, Benign neoplasm of ascending colon                        D12.0, Benign neoplasm of cecum                        D12.3, Benign neoplasm of transverse colon (hepatic                         flexure or  splenic flexure)                        D12.5, Benign neoplasm of sigmoid colon                        K57.30, Diverticulosis of large intestine without                         perforation or abscess without bleeding CPT copyright 2022 American Medical Association. All rights reserved. The codes documented in this report are preliminary and upon coder review may  be revised to meet current compliance requirements. Dr. Libby Maw Toney Reil MD, MD 10/01/2022 9:07:35 AM This report has been signed electronically. Number of Addenda: 0 Note Initiated On: 10/01/2022 8:08 AM Scope Withdrawal Time: 0 hours 36  minutes 7 seconds  Total Procedure Duration: 0 hours 40 minutes 12 seconds  Estimated Blood Loss:  Estimated blood loss: none.      [facility]

## 2022-10-01 NOTE — H&P (Signed)
[name], MD [address]  [address]  [place], [place] [address]  Main: [phone]  Fax: [phone] Pager: [phone]  Primary Care Physician:  Marjie Skiff, NP Primary Gastroenterologist:  Dr. [name]  Pre-Procedure History & Physical: HPI:  Shannon Marsh is a 57 y.o. female is here for an colonoscopy.   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   BILATERAL SALPINGECTOMY  03/23/2012   Procedure: BILATERAL SALPINGECTOMY;  Surgeon: Dara Lords, MD;  Location: [facility];  Service: Gynecology;  Laterality: Bilateral;   BREAST CYST ASPIRATION Left    neg   BUNIONECTOMY Left 03/25/2018   Procedure: LAPIDUS - TYPE LEFT;  Surgeon: Gwyneth Revels, DPM;  Location: [facility];  Service: Podiatry;  Laterality: Left;  LAPIPLASTY SET GENERAL WITH LOCAL   BUNIONECTOMY Left    BUNIONECTOMY Right 02/10/2019   Procedure: LAPIDUS TYPE RIGHT;  Surgeon: Gwyneth Revels, DPM;  Location: [facility];  Service: Podiatry;  Laterality: Right;  general with local LAPIPLASTY SET   CHOLECYSTECTOMY  03/09/2014   TOTAL ABDOMINAL HYSTERECTOMY W/ BILATERAL SALPINGOOPHORECTOMY      Prior to Admission medications   Medication Sig Start Date End Date Taking? Authorizing Provider  Acetaminophen (TYLENOL 8 HOUR PO) Take by mouth as needed.   Yes [provider]  amLODipine (NORVASC) 10 MG tablet Take 1 tablet (10 mg total) by mouth daily. 07/30/22  Yes Cannady, Jolene T,  NP  cholecalciferol (VITAMIN D3) 25 MCG (1000 UNIT) tablet Take 1,000 Units by mouth daily.   Yes [provider]  lisinopril (ZESTRIL) 40 MG tablet Take 1 tablet (40 mg total) by mouth daily. 07/30/22  Yes Cannady, Jolene T, NP  Melatonin 1 MG TABS Take 1 mg by mouth as needed.   Yes [provider]  Multiple Vitamin (MULTIVITAMIN) tablet Take 1 tablet by mouth daily. Occasionally a chewable   Yes [provider]  omeprazole (PRILOSEC) 20 MG capsule Take 20 mg by mouth daily.   Yes [provider]  rosuvastatin (CRESTOR) 10 MG tablet Take 1 tablet (10 mg total) by mouth daily. 07/30/22  Yes Cannady, Jolene T, NP  sertraline (ZOLOFT) 100 MG tablet Take 1 tablet (100 mg total) by mouth daily. 07/30/22  Yes Cannady, Jolene T, NP  gabapentin (NEURONTIN) 100 MG capsule Take 1 capsule (100 mg total) by mouth at bedtime. Patient not taking: Reported on 09/23/2022 07/30/22   Aura Dials T, NP    Allergies as of 07/31/2022   (No Known Allergies)    Family History  Problem Relation Age of Onset   Diabetes Mother    Hypertension Mother    Dementia Mother    Diabetes Father    Heart disease Father    Hypertension Sister    Bullous pemphigoid Sister    Hypertension Brother    Hypertension Brother    Hypertension Brother    Heart attack Brother    Diabetes Brother  Cancer Maternal Grandfather        lung   Other Neg Hx    Breast cancer Neg Hx     Social History   Socioeconomic History   Marital status: Married    Spouse name: Not on file   Number of children: Not on file   Years of education: Not on file   Highest education level: Not on file  Occupational History   Not on file  Tobacco Use   Smoking status: Never   Smokeless tobacco: Never  Vaping Use   Vaping status: Never Used  Substance and Sexual Activity   Alcohol use: Yes    Alcohol/week: 3.0 standard drinks of alcohol    Types: 3 Standard drinks or equivalent per week   Drug use:  No   Sexual activity: Yes    Partners: Male    Birth control/protection: Surgical    Comment: 1st intercourse 57 yo-Fewer than 5 partners  Other Topics Concern   Not on file  Social History Narrative   Not on file   Social Determinants of Health   Financial Resource Strain: Low Risk  (01/12/2021)   Overall Financial Resource Strain (CARDIA)    Difficulty of Paying Living Expenses: Not hard at all  Food Insecurity: No Food Insecurity (01/12/2021)   Hunger Vital Sign    Worried About Running Out of Food in the Last Year: Never true    Ran Out of Food in the Last Year: Never true  Transportation Needs: No Transportation Needs (01/12/2021)   PRAPARE - Administrator, Civil Service (Medical): No    Lack of Transportation (Non-Medical): No  Physical Activity: Insufficiently Active (01/12/2021)   Exercise Vital Sign    Days of Exercise per Week: 2 days    Minutes of Exercise per Session: 30 min  Stress: No Stress Concern Present (01/12/2021)   [employer] of Occupational Health - Occupational Stress Questionnaire    Feeling of Stress : Not at all  Social Connections: Moderately Integrated (01/12/2021)   Social Connection and Isolation Panel [NHANES]    Frequency of Communication with Friends and Family: More than three times a week    Frequency of Social Gatherings with Friends and Family: More than three times a week    Attends Religious Services: More than 4 times per year    Active Member of [employer] or Organizations: No    Attends Banker Meetings: Never    Marital Status: Married  Catering manager Violence: Not At Risk (01/12/2021)   Humiliation, Afraid, Rape, and Kick questionnaire    Fear of Current or Ex-Partner: No    Emotionally Abused: No    Physically Abused: No    Sexually Abused: No    Review of Systems: See HPI, otherwise negative ROS  Physical Exam: BP 127/88   Pulse 79   Temp 99.9 F (37.7 C) (Temporal)   Resp (!) 22   Ht 5\' 4"   (1.626 m)   Wt 89.4 kg   LMP 02/18/2012   SpO2 94%   BMI 33.81 kg/m  General:   Alert,  pleasant and cooperative in NAD Head:  Normocephalic and atraumatic. Neck:  Supple; no masses or thyromegaly. Lungs:  Clear throughout to auscultation.    Heart:  Regular rate and rhythm. Abdomen:  Soft, nontender and nondistended. Normal bowel sounds, without guarding, and without rebound.   Neurologic:  Alert and  oriented x4;  grossly normal neurologically.  Impression/Plan: Shannon Marsh is here  for an colonoscopy to be performed for colon cancer screening  Risks, benefits, limitations, and alternatives regarding  colonoscopy have been reviewed with the patient.  Questions have been answered.  All parties agreeable.   Lannette Donath, MD  10/01/2022, 7:28 AM

## 2022-10-01 NOTE — Transfer of Care (Signed)
Immediate Anesthesia Transfer of Care Note  Patient: Shannon Marsh  Procedure(s) Performed: COLONOSCOPY WITH PROPOFOL POLYPECTOMY  Patient Location: PACU  Anesthesia Type:General  Level of Consciousness: awake and alert   Airway & Oxygen Therapy: Patient Spontanous Breathing  Post-op Assessment: Report given to RN and Post -op Vital signs reviewed and stable  Post vital signs: Reviewed and stable  Last Vitals:  Vitals Value Taken Time  BP 103/81 10/01/22 0910  Temp    Pulse 65 10/01/22 0912  Resp 22 10/01/22 0912  SpO2 96 % 10/01/22 0912  Vitals shown include unfiled device data.  Last Pain:  Vitals:   10/01/22 0724  TempSrc: Temporal  PainSc: 0-No pain         Complications: No notable events documented.

## 2022-10-02 ENCOUNTER — Encounter: Payer: Self-pay | Admitting: Gastroenterology

## 2022-10-07 ENCOUNTER — Encounter: Payer: Self-pay | Admitting: Gastroenterology

## 2022-12-11 ENCOUNTER — Telehealth: Payer: Self-pay | Admitting: Gastroenterology

## 2022-12-11 NOTE — Telephone Encounter (Signed)
Patient called in because she is having a hard time contacting billing, she stated that she has tried everything she can before calling us back. The patient said that she left Marcelino Duster a detail message to call her back. I said I will try to call and see if I can get in touch with someone and if I do, I will transfer over. I was able to get someone on the phone for billing to transfer the patient.

## 2022-12-11 NOTE — Telephone Encounter (Signed)
Patient called in because she has questions about the CTP code that was filed. I did advise her that she will need to call billing for the payment and speak to the scheduler for the code that was filed. Patient stated that she doesn't see a code for her colonoscopy. Informed the patient depending on what the referral say we go by the referral we only put in what is on the referral. She also wanted to know if it can reverse, I did inform her that will be a question for billing. She stated that her insurance stated they need a new EOB to refile her claim. I did advise her to call her PCP because we only go by what's on the referral. I gave her the number to billing.

## 2022-12-11 NOTE — Telephone Encounter (Signed)
She talked to billing and they explain why she has a balance and she wanted to let us know that she don't need that call back.

## 2023-02-02 NOTE — Patient Instructions (Signed)
Be Involved in Caring For Your Health:  Taking Medications When medications are taken as directed, they can greatly improve your health. But if they are not taken as prescribed, they may not work. In some cases, not taking them correctly can be harmful. To help ensure your treatment remains effective and safe, understand your medications and how to take them. Bring your medications to each visit for review by your provider.  Your lab results, notes, and after visit summary will be available on My Chart. We strongly encourage you to use this feature. If lab results are abnormal the clinic will contact you with the appropriate steps. If the clinic does not contact you assume the results are satisfactory. You can always view your results on My Chart. If you have questions regarding your health or results, please contact the clinic during office hours. You can also ask questions on My Chart.  We at [facility] are grateful that you chose [place] to provide your care. We strive to provide evidence-based and compassionate care and are always looking for feedback. If you get a survey from the clinic please complete this so we can hear your opinions.  Healthy Eating, Adult Healthy eating may help you get and keep a healthy body weight, reduce the risk of chronic disease, and live a long and productive life. It is important to follow a healthy eating pattern. Your nutritional and calorie needs should be met mainly by different nutrient-rich foods. What are tips for following this plan? Reading food labels Read labels and choose the following: Reduced or low sodium products. Juices with 100% fruit juice. Foods with low saturated fats (<3 g per serving) and high polyunsaturated and monounsaturated fats. Foods with whole grains, such as whole wheat, cracked wheat, brown rice, and wild rice. Whole grains that are fortified with folic acid. This is recommended for females who are pregnant or who want to  become pregnant. Read labels and do not eat or drink the following: Foods or drinks with added sugars. These include foods that contain brown sugar, corn sweetener, corn syrup, dextrose, fructose, glucose, high-fructose corn syrup, honey, invert sugar, lactose, malt syrup, maltose, molasses, raw sugar, sucrose, trehalose, or turbinado sugar. Limit your intake of added sugars to less than 10% of your total daily calories. Do not eat more than the following amounts of added sugar per day: 6 teaspoons (25 g) for females. 9 teaspoons (38 g) for males. Foods that contain processed or refined starches and grains. Refined grain products, such as white flour, degermed cornmeal, white bread, and white rice. Shopping Choose nutrient-rich snacks, such as vegetables, whole fruits, and nuts. Avoid high-calorie and high-sugar snacks, such as potato chips, fruit snacks, and candy. Use oil-based dressings and spreads on foods instead of solid fats such as butter, margarine, sour cream, or cream cheese. Limit pre-made sauces, mixes, and "instant" products such as flavored rice, instant noodles, and ready-made pasta. Try more plant-protein sources, such as tofu, tempeh, black beans, edamame, lentils, nuts, and seeds. Explore eating plans such as the Mediterranean diet or vegetarian diet. Try heart-healthy dips made with beans and healthy fats like hummus and guacamole. Vegetables go great with these. Cooking Use oil to saut or stir-fry foods instead of solid fats such as butter, margarine, or lard. Try baking, boiling, grilling, or broiling instead of frying. Remove the fatty part of meats before cooking. Steam vegetables in water or broth. Meal planning  At meals, imagine dividing your plate into fourths: One-half of  your plate is fruits and vegetables. One-fourth of your plate is whole grains. One-fourth of your plate is protein, especially lean meats, poultry, eggs, tofu, beans, or nuts. Include low-fat  dairy as part of your daily diet. Lifestyle Choose healthy options in all settings, including home, work, school, restaurants, or stores. Prepare your food safely: Wash your hands after handling raw meats. Where you prepare food, keep surfaces clean by regularly washing with hot, soapy water. Keep raw meats separate from ready-to-eat foods, such as fruits and vegetables. Cook seafood, meat, poultry, and eggs to the recommended temperature. Get a food thermometer. Store foods at safe temperatures. In general: Keep cold foods at 48F (4.4C) or below. Keep hot foods at 148F (60C) or above. Keep your freezer at [facility] (-17.8C) or below. Foods are not safe to eat if they have been between the temperatures of 40-148F (4.4-60C) for more than 2 hours. What foods should I eat? Fruits Aim to eat 1-2 cups of fresh, canned (in natural juice), or frozen fruits each day. One cup of fruit equals 1 small apple, 1 large banana, 8 large strawberries, 1 cup (237 g) canned fruit,  cup (82 g) dried fruit, or 1 cup (240 mL) 100% juice. Vegetables Aim to eat 2-4 cups of fresh and frozen vegetables each day, including different varieties and colors. One cup of vegetables equals 1 cup (91 g) broccoli or cauliflower florets, 2 medium carrots, 2 cups (150 g) raw, leafy greens, 1 large tomato, 1 large bell pepper, 1 large sweet potato, or 1 medium white potato. Grains Aim to eat 5-10 ounce-equivalents of whole grains each day. Examples of 1 ounce-equivalent of grains include 1 slice of bread, 1 cup (40 g) ready-to-eat cereal, 3 cups (24 g) popcorn, or  cup (93 g) cooked rice. Meats and other proteins Try to eat 5-7 ounce-equivalents of protein each day. Examples of 1 ounce-equivalent of protein include 1 egg,  oz nuts (12 almonds, 24 pistachios, or 7 walnut halves), 1/4 cup (90 g) cooked beans, 6 tablespoons (90 g) hummus or 1 tablespoon (16 g) peanut butter. A cut of meat or fish that is the size of a deck of  cards is about 3-4 ounce-equivalents (85 g). Of the protein you eat each week, try to have at least 8 sounce (227 g) of seafood. This is about 2 servings per week. This includes salmon, trout, herring, sardines, and anchovies. Dairy Aim to eat 3 cup-equivalents of fat-free or low-fat dairy each day. Examples of 1 cup-equivalent of dairy include 1 cup (240 mL) milk, 8 ounces (250 g) yogurt, 1 ounces (44 g) natural cheese, or 1 cup (240 mL) fortified soy milk. Fats and oils Aim for about 5 teaspoons (21 g) of fats and oils per day. Choose monounsaturated fats, such as canola and olive oils, mayonnaise made with olive oil or avocado oil, avocados, peanut butter, and most nuts, or polyunsaturated fats, such as sunflower, corn, and soybean oils, walnuts, pine nuts, sesame seeds, sunflower seeds, and flaxseed. Beverages Aim for 6 eight-ounce glasses of water per day. Limit coffee to 3-5 eight-ounce cups per day. Limit caffeinated beverages that have added calories, such as soda and energy drinks. If you drink alcohol: Limit how much you have to: 0-1 drink a day if you are female. 0-2 drinks a day if you are female. Know how much alcohol is in your drink. In the [place] one drink is one 12 oz bottle of beer (355 mL), one 5 oz glass of wine (  148 mL), or one 1 oz glass of hard liquor (44 mL). Seasoning and other foods Try not to add too much salt to your food. Try using herbs and spices instead of salt. Try not to add sugar to food. This information is based on [place] nutrition guidelines. To learn more, visit [employer]. Exact amounts may vary. You may need different amounts. This information is not intended to replace advice given to you by your health care provider. Make sure you discuss any questions you have with your health care provider. Document Revised: 11/26/2021 Document Reviewed: 11/26/2021 [employer]vier Patient Education  2024 [employer].

## 2023-02-03 ENCOUNTER — Ambulatory Visit (INDEPENDENT_AMBULATORY_CARE_PROVIDER_SITE_OTHER): Payer: Managed Care, Other (non HMO) | Admitting: Nurse Practitioner

## 2023-02-03 ENCOUNTER — Encounter: Payer: Self-pay | Admitting: Nurse Practitioner

## 2023-02-03 VITALS — BP 132/80 | HR 84 | Temp 98.5°F | Ht 63.5 in | Wt 206.6 lb

## 2023-02-03 DIAGNOSIS — K219 Gastro-esophageal reflux disease without esophagitis: Secondary | ICD-10-CM | POA: Diagnosis not present

## 2023-02-03 DIAGNOSIS — R7301 Impaired fasting glucose: Secondary | ICD-10-CM | POA: Diagnosis not present

## 2023-02-03 DIAGNOSIS — E782 Mixed hyperlipidemia: Secondary | ICD-10-CM

## 2023-02-03 DIAGNOSIS — I10 Essential (primary) hypertension: Secondary | ICD-10-CM | POA: Diagnosis not present

## 2023-02-03 DIAGNOSIS — Z2821 Immunization not carried out because of patient refusal: Secondary | ICD-10-CM

## 2023-02-03 DIAGNOSIS — Z0000 Encounter for general adult medical examination without abnormal findings: Secondary | ICD-10-CM | POA: Diagnosis not present

## 2023-02-03 DIAGNOSIS — Z23 Encounter for immunization: Secondary | ICD-10-CM

## 2023-02-03 DIAGNOSIS — Z638 Other specified problems related to primary support group: Secondary | ICD-10-CM

## 2023-02-03 DIAGNOSIS — Z1231 Encounter for screening mammogram for malignant neoplasm of breast: Secondary | ICD-10-CM

## 2023-02-03 DIAGNOSIS — Z6836 Body mass index (BMI) 36.0-36.9, adult: Secondary | ICD-10-CM

## 2023-02-03 DIAGNOSIS — E66811 Obesity, class 1: Secondary | ICD-10-CM

## 2023-02-03 DIAGNOSIS — E669 Obesity, unspecified: Secondary | ICD-10-CM

## 2023-02-03 DIAGNOSIS — E6609 Other obesity due to excess calories: Secondary | ICD-10-CM

## 2023-02-03 DIAGNOSIS — E559 Vitamin D deficiency, unspecified: Secondary | ICD-10-CM

## 2023-02-03 LAB — BAYER DCA HB A1C WAIVED: HB A1C (BAYER DCA - WAIVED): 5.8 % — ABNORMAL HIGH (ref 4.8–5.6)

## 2023-02-03 NOTE — Assessment & Plan Note (Signed)
History of low levels, continues daily supplement.  Recheck level today and adjust regimen as needed.

## 2023-02-03 NOTE — Progress Notes (Signed)
BP 132/80 (BP Location: Left Arm, Patient Position: Sitting, Cuff Size: Normal)   Pulse 84   Temp 98.5 F (36.9 C) (Oral)   Ht 5' 3.5" (1.613 m)   Wt 206 lb 9.6 oz (93.7 kg)   LMP 02/18/2012   SpO2 98%   BMI 36.02 kg/m    Subjective:    Patient ID: Shannon Marsh, female    DOB: 05-Dec-1965, 57 y.o.   MRN: [id number]  HPI: Shannon Marsh is a 57 y.o. female presenting on 02/03/2023 for comprehensive medical examination. Current medical complaints include:none  She currently lives with: husband Menopausal Symptoms: yes  HYPERTENSION Taking Lisinopril 40 MG and Amlodipine 10 MG daily.  Rosuvastatin for HLD.    History of elevations in glucose on labs and of gestational diabetes -- A1c in May was 6.2%. Takes as needed Omeprazole for GERD with benefit, takes about a few times a week. Hypertension status: stable  Satisfied with current treatment? yes Duration of hypertension: chronic BP monitoring frequency: not checking BP range: not checking BP medication side effects:  no Medication compliance: good compliance Aspirin: no Recurrent headaches: no Visual changes: no Palpitations: no Dyspnea: no Chest pain: no Lower extremity edema: no Dizzy/lightheaded: no  The 10-year ASCVD risk score (Arnett DK, et al., 2019) is: 2.7%   Values used to calculate the score:     Age: 16 years     Sex: Female     Is Non-Hispanic African American: No     Diabetic: No     Tobacco smoker: No     Systolic Blood Pressure: 132 mmHg     Is BP treated: Yes     HDL Cholesterol: 58 mg/dL     Total Cholesterol: 159 mg/dL  DEPRESSION Taking Sertraline 100 MG daily.  Continues Vitamin D for history of low levels Mood status: stable Satisfied with current treatment?: yes Symptom severity: moderate  Duration of current treatment : chronic Side effects: no Medication compliance: good compliance Psychotherapy/counseling: none Depressed mood: no Anxious mood: no Anhedonia:  no Significant weight loss or gain: no Insomnia: no Fatigue: no Feelings of worthlessness or guilt: no Impaired concentration/indecisiveness: no Suicidal ideations: no Hopelessness: no Crying spells: no    02/03/2023    1:13 PM 07/30/2022   10:25 AM 01/29/2022    1:15 PM 09/25/2021    9:15 AM 07/12/2021    9:14 AM  Depression screen PHQ 2/9  Decreased Interest 0 0 0 0 0  Down, Depressed, Hopeless 0 0 0 0 0  PHQ - 2 Score 0 0 0 0 0  Altered sleeping 0 1 1 0 0  Tired, decreased energy 0 0 0 0 0  Change in appetite 1 0 1 0 1  Feeling bad or failure about yourself  0 0 0 0 0  Trouble concentrating 0 0 0 1 3  Moving slowly or fidgety/restless 0 0 0 0 0  Suicidal thoughts 0 0 0 0 0  PHQ-9 Score 1 1 2 1 4   Difficult doing work/chores Not difficult at all Not difficult at all Not difficult at all Not difficult at all Somewhat difficult      02/03/2023    1:13 PM 07/30/2022   10:25 AM 01/29/2022    1:15 PM 09/25/2021    9:16 AM  GAD 7 : Generalized Anxiety Score  Nervous, Anxious, on Edge 0 0 0 0  Control/stop worrying 0 0 0 0  Worry too much - different things 0 0 0 0  Trouble relaxing 0 0 0 0  Restless 0 0 0 0  Easily annoyed or irritable 0 0 0 0  Afraid - awful might happen 0 0 0 0  Total GAD 7 Score 0 0 0 0  Anxiety Difficulty Not difficult at all Not difficult at all Not difficult at all Not difficult at all      07/12/2021    9:13 AM 09/25/2021    9:15 AM 01/29/2022    1:15 PM 07/30/2022   10:25 AM 02/03/2023    1:13 PM  Fall Risk  Falls in the past year? 0 0 0 0 0  Was there an injury with Fall? 0 0 0 0 0  Fall Risk Category Calculator 0 0 0 0 0  Fall Risk Category (Retired) Low Low Low    (RETIRED) Patient Fall Risk Level Low fall risk Low fall risk     Patient at Risk for Falls Due to No Fall Risks No Fall Risks No Fall Risks No Fall Risks No Fall Risks  Fall risk Follow up Falls evaluation completed Falls evaluation completed Falls evaluation completed Falls  evaluation completed Falls evaluation completed    Functional Status Survey: Is the patient deaf or have difficulty hearing?: No Does the patient have difficulty seeing, even when wearing glasses/contacts?: No Does the patient have difficulty concentrating, remembering, or making decisions?: No Does the patient have difficulty walking or climbing stairs?: No Does the patient have difficulty dressing or bathing?: No Does the patient have difficulty doing errands alone such as visiting a doctor's office or shopping?: No    BMI Metric Follow Up - 02/03/23 1331       BMI Metric Follow Up-Please document annually   BMI Metric Follow Up Nutrition counseling              Past Medical History:  Past Medical History:  Diagnosis Date   Bunion, right foot    Gall stones    GERD (gastroesophageal reflux disease)    with spicy foods   Hx gestational diabetes    Hypertension    Obesity (BMI 30.0-34.9)    Pre-diabetes    Varicose vein of leg     Surgical History:  Past Surgical History:  Procedure Laterality Date   ABDOMINAL HYSTERECTOMY  03/23/2012   Procedure: HYSTERECTOMY ABDOMINAL;  Surgeon: Dara Lords, MD;  Location: [facility];  Service: Gynecology;  Laterality: N/A;  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   BILATERAL SALPINGECTOMY  03/23/2012   Procedure: BILATERAL SALPINGECTOMY;  Surgeon: Dara Lords, MD;  Location: [facility];  Service: Gynecology;  Laterality: Bilateral;   BREAST CYST ASPIRATION Left    neg   BUNIONECTOMY Left 03/25/2018   Procedure: LAPIDUS - TYPE LEFT;  Surgeon: Gwyneth Revels, DPM;  Location: [facility];  Service: Podiatry;  Laterality: Left;  LAPIPLASTY SET GENERAL WITH LOCAL   BUNIONECTOMY Left    BUNIONECTOMY Right 02/10/2019   Procedure: LAPIDUS TYPE RIGHT;  Surgeon: Gwyneth Revels, DPM;  Location: [facility];  Service: Podiatry;  Laterality: Right;  general with local LAPIPLASTY SET   CHOLECYSTECTOMY   03/09/2014   COLONOSCOPY WITH PROPOFOL N/A 10/01/2022   Procedure: COLONOSCOPY WITH PROPOFOL;  Surgeon: Toney Reil, MD;  Location: [facility];  Service: Endoscopy;  Laterality: N/A;   POLYPECTOMY  10/01/2022   Procedure: POLYPECTOMY;  Surgeon: Toney Reil, MD;  Location: [facility];  Service: Endoscopy;;   TOTAL ABDOMINAL HYSTERECTOMY W/ BILATERAL SALPINGOOPHORECTOMY  Medications:  Current Outpatient Medications on File Prior to Visit  Medication Sig   Acetaminophen (TYLENOL 8 HOUR PO) Take by mouth as needed.   amLODipine (NORVASC) 10 MG tablet Take 1 tablet (10 mg total) by mouth daily.   cholecalciferol (VITAMIN D3) 25 MCG (1000 UNIT) tablet Take 1,000 Units by mouth daily.   lisinopril (ZESTRIL) 40 MG tablet Take 1 tablet (40 mg total) by mouth daily.   Melatonin 1 MG TABS Take 1 mg by mouth as needed.   Multiple Vitamin (MULTIVITAMIN) tablet Take 1 tablet by mouth daily. Occasionally a chewable   omeprazole (PRILOSEC) 20 MG capsule Take 20 mg by mouth daily.   rosuvastatin (CRESTOR) 10 MG tablet Take 1 tablet (10 mg total) by mouth daily.   sertraline (ZOLOFT) 100 MG tablet Take 1 tablet (100 mg total) by mouth daily.   No current facility-administered medications on file prior to visit.    Allergies:  No Known Allergies  Social History:  Social History   Socioeconomic History   Marital status: Married    Spouse name: Not on file   Number of children: Not on file   Years of education: Not on file   Highest education level: Not on file  Occupational History   Not on file  Tobacco Use   Smoking status: Never   Smokeless tobacco: Never  Vaping Use   Vaping status: Never Used  Substance and Sexual Activity   Alcohol use: Yes    Alcohol/week: 3.0 standard drinks of alcohol    Types: 3 Standard drinks or equivalent per week   Drug use: No   Sexual activity: Yes    Partners: Male    Birth control/protection: Surgical    Comment:  1st intercourse 57 yo-Fewer than 5 partners  Other Topics Concern   Not on file  Social History Narrative   Not on file   Social Determinants of Health   Financial Resource Strain: Low Risk  (02/03/2023)   Overall Financial Resource Strain (CARDIA)    Difficulty of Paying Living Expenses: Not hard at all  Food Insecurity: No Food Insecurity (02/03/2023)   Hunger Vital Sign    Worried About Running Out of Food in the Last Year: Never true    Ran Out of Food in the Last Year: Never true  Transportation Needs: No Transportation Needs (02/03/2023)   PRAPARE - Administrator, Civil Service (Medical): No    Lack of Transportation (Non-Medical): No  Physical Activity: Inactive (02/03/2023)   Exercise Vital Sign    Days of Exercise per Week: 0 days    Minutes of Exercise per Session: 0 min  Stress: No Stress Concern Present (02/03/2023)   [employer] of Occupational Health - Occupational Stress Questionnaire    Feeling of Stress : Not at all  Social Connections: Moderately Integrated (02/03/2023)   Social Connection and Isolation Panel [NHANES]    Frequency of Communication with Friends and Family: More than three times a week    Frequency of Social Gatherings with Friends and Family: Once a week    Attends Religious Services: 1 to 4 times per year    Active Member of [employer] or Organizations: No    Attends Banker Meetings: Never    Marital Status: Married  Catering manager Violence: Not At Risk (02/03/2023)   Humiliation, Afraid, Rape, and Kick questionnaire    Fear of Current or Ex-Partner: No    Emotionally Abused: No    Physically Abused:  No    Sexually Abused: No   Social History   Tobacco Use  Smoking Status Never  Smokeless Tobacco Never   Social History   Substance and Sexual Activity  Alcohol Use Yes   Alcohol/week: 3.0 standard drinks of alcohol   Types: 3 Standard drinks or equivalent per week    Family History:  Family History   Problem Relation Age of Onset   Diabetes Mother    Hypertension Mother    Dementia Mother    Diabetes Father    Heart disease Father    Hypertension Sister    Bullous pemphigoid Sister    Hypertension Brother    Hypertension Brother    Hypertension Brother    Heart attack Brother    Diabetes Brother    Cancer Maternal Grandfather        lung   Other Neg Hx    Breast cancer Neg Hx     Past medical history, surgical history, medications, allergies, family history and social history reviewed with patient today and changes made to appropriate areas of the chart.   Review of Systems - negative All other ROS negative except what is listed above and in the HPI.      Objective:    BP 132/80 (BP Location: Left Arm, Patient Position: Sitting, Cuff Size: Normal)   Pulse 84   Temp 98.5 F (36.9 C) (Oral)   Ht 5' 3.5" (1.613 m)   Wt 206 lb 9.6 oz (93.7 kg)   LMP 02/18/2012   SpO2 98%   BMI 36.02 kg/m   Wt Readings from Last 3 Encounters:  02/03/23 206 lb 9.6 oz (93.7 kg)  10/01/22 197 lb (89.4 kg)  07/30/22 198 lb 9.6 oz (90.1 kg)    Physical Exam Vitals and nursing note reviewed.  Constitutional:      General: She is awake. She is not in acute distress.    Appearance: She is well-developed and well-groomed. She is obese. She is not ill-appearing or toxic-appearing.  HENT:     Head: Normocephalic and atraumatic.     Right Ear: Hearing, tympanic membrane, ear canal and external ear normal. No drainage.     Left Ear: Hearing, tympanic membrane, ear canal and external ear normal. No drainage.     Nose: Nose normal.     Right Sinus: No maxillary sinus tenderness or frontal sinus tenderness.     Left Sinus: No maxillary sinus tenderness or frontal sinus tenderness.     Mouth/Throat:     Mouth: Mucous membranes are moist.     Pharynx: Oropharynx is clear. Uvula midline. No pharyngeal swelling, oropharyngeal exudate or posterior oropharyngeal erythema.  Eyes:     General: Lids  are normal.        Right eye: No discharge.        Left eye: No discharge.     Extraocular Movements: Extraocular movements intact.     Conjunctiva/sclera: Conjunctivae normal.     Pupils: Pupils are equal, round, and reactive to light.     Visual Fields: Right eye visual fields normal and left eye visual fields normal.  Neck:     Thyroid: No thyromegaly.     Vascular: No carotid bruit.     Trachea: Trachea normal.  Cardiovascular:     Rate and Rhythm: Normal rate and regular rhythm.     Heart sounds: Normal heart sounds. No murmur heard.    No gallop.  Pulmonary:     Effort: Pulmonary effort is  normal. No accessory muscle usage or respiratory distress.     Breath sounds: Normal breath sounds.  Chest:  Breasts:    Right: Normal.     Left: Normal.  Abdominal:     General: Bowel sounds are normal.     Palpations: Abdomen is soft. There is no hepatomegaly or splenomegaly.     Tenderness: There is no abdominal tenderness.  Musculoskeletal:        General: Normal range of motion.     Cervical back: Normal range of motion and neck supple.     Right lower leg: No edema.     Left lower leg: No edema.  Lymphadenopathy:     Head:     Right side of head: No submental, submandibular, tonsillar, preauricular or posterior auricular adenopathy.     Left side of head: No submental, submandibular, tonsillar, preauricular or posterior auricular adenopathy.     Cervical: No cervical adenopathy.     Upper Body:     Right upper body: No supraclavicular, axillary or pectoral adenopathy.     Left upper body: No supraclavicular, axillary or pectoral adenopathy.  Skin:    General: Skin is warm and dry.     Capillary Refill: Capillary refill takes less than 2 seconds.  Neurological:     Mental Status: She is alert and oriented to person, place, and time.     Gait: Gait is intact.     Deep Tendon Reflexes: Reflexes are normal and symmetric.     Reflex Scores:      Brachioradialis reflexes are 2+  on the right side and 2+ on the left side.      Patellar reflexes are 2+ on the right side and 2+ on the left side. Psychiatric:        Attention and Perception: Attention normal.        Mood and Affect: Mood normal.        Speech: Speech normal.        Behavior: Behavior normal. Behavior is cooperative.        Thought Content: Thought content normal.        Judgment: Judgment normal.      Results for orders placed or performed in visit on 07/30/22  Basic metabolic panel  Result Value Ref Range   Glucose 108 (H) 70 - 99 mg/dL   BUN 15 6 - 24 mg/dL   Creatinine, Ser 6.38 0.57 - 1.00 mg/dL   eGFR 98 >75 IE/PPI/9.51   BUN/Creatinine Ratio 21 9 - 23   Sodium 144 134 - 144 mmol/L   Potassium 4.5 3.5 - 5.2 mmol/L   Chloride 107 (H) 96 - 106 mmol/L   CO2 22 20 - 29 mmol/L   Calcium 9.7 8.7 - 10.2 mg/dL  Lipid Panel w/o Chol/HDL Ratio  Result Value Ref Range   Cholesterol, Total 159 100 - 199 mg/dL   Triglycerides 884 0 - 149 mg/dL   HDL 58 >16 mg/dL   VLDL Cholesterol Cal 23 5 - 40 mg/dL   LDL Chol Calc (NIH) 78 0 - 99 mg/dL  HgB S0Y  Result Value Ref Range   Hgb A1c MFr Bld 6.2 (H) 4.8 - 5.6 %   Est. average glucose Bld gHb Est-mCnc 131 mg/dL      Assessment & Plan:   Problem List Items Addressed This Visit       Cardiovascular and Mediastinum   Benign hypertension - Primary   Relevant Orders   CBC with Differential/Platelet  Comprehensive metabolic panel   TSH     Digestive   Gastroesophageal reflux disease without esophagitis     Endocrine   IFG (impaired fasting glucose)   Relevant Orders   Bayer DCA Hb A1c Waived     Other   Caregiver role strain   Mixed hyperlipidemia   Relevant Orders   Comprehensive metabolic panel   Lipid Panel w/o Chol/HDL Ratio   Obesity (BMI 30.0-34.9)   Vitamin D deficiency   Relevant Orders   VITAMIN D 25 Hydroxy (Vit-D Deficiency, Fractures)   Other Visit Diagnoses     Flu vaccine need       Flu vaccine today, educated  on this.   Encounter for screening mammogram for malignant neoplasm of breast       Mammogram ordered and instructed how to schedule.   Relevant Orders   MM 3D SCREENING MAMMOGRAM BILATERAL BREAST   Encounter for annual physical exam       Annual physical today with labs and health maintenance reviewed, discussed with patient.        Follow up plan: Return in about 6 months (around 08/03/2023) for HTN/HLD, DEPRESSION.   LABORATORY TESTING:  - Pap smear: not applicable  IMMUNIZATIONS:   - Tetanus vaccination status reviewed: last tetanus booster within 10 years. - Influenza: refuses today - Pneumovax: Not applicable - Prevnar: Not applicable - HPV: Not applicable - Zostavax vaccine: Up To Date - Covid: Had 1st two  SCREENING: -Mammogram:  Ordered today - Colonoscopy: Up To Date -- due next 09/30/2025 - Bone Density: Not applicable  -Hearing Test: Not applicable  -Spirometry: Not applicable   PATIENT COUNSELING:   Advised to take 1 mg of folate supplement per day if capable of pregnancy.   Sexuality: Discussed sexually transmitted diseases, partner selection, use of condoms, avoidance of unintended pregnancy  and contraceptive alternatives.   Advised to avoid cigarette smoking.  I discussed with the patient that most people either abstain from alcohol or drink within safe limits (<=14/week and <=4 drinks/occasion for males, <=7/weeks and <= 3 drinks/occasion for females) and that the risk for alcohol disorders and other health effects rises proportionally with the number of drinks per week and how often a drinker exceeds daily limits.  Discussed cessation/primary prevention of drug use and availability of treatment for abuse.   Diet: Encouraged to adjust caloric intake to maintain  or achieve ideal body weight, to reduce intake of dietary saturated fat and total fat, to limit sodium intake by avoiding high sodium foods and not adding table salt, and to maintain adequate  dietary potassium and calcium preferably from fresh fruits, vegetables, and low-fat dairy products.    Stressed the importance of regular exercise  Injury prevention: Discussed safety belts, safety helmets, smoke detector, smoking near bedding or upholstery.   Dental health: Discussed importance of regular tooth brushing, flossing, and dental visits.    NEXT PREVENTATIVE PHYSICAL DUE IN 1 YEAR. Return in about 6 months (around 08/03/2023) for HTN/HLD, DEPRESSION.

## 2023-02-03 NOTE — Assessment & Plan Note (Signed)
History of gestational diabetes, check A1c today and yearly.  Recent A1c 6.2%.  Start medication as needed.

## 2023-02-03 NOTE — Assessment & Plan Note (Signed)
Chronic, ongoing.  Continue Rosuvastatin and adjust as needed.  Check lipid panel today.

## 2023-02-03 NOTE — Assessment & Plan Note (Signed)
Chronic, stable.  BP well at goal in office today. Continue Lisinopril at 40 MG and Amlodipine 10 MG daily as offering benefit. Recommend she monitor BP at least a few mornings a week at home and document.  DASH diet at home.  Labs today: CBC, TSH, CMP.  Urine ALB 30, continue ACE for kidney protection.  Return in 6 months.

## 2023-02-03 NOTE — Assessment & Plan Note (Signed)
Chronic, stable with Sertraline at 100 MG.  Continue this current dose and will adjust in future, possibly reduce if improvement in symptoms and stressors.  Denies SI/HI.

## 2023-02-03 NOTE — Assessment & Plan Note (Signed)
BMI 36.02.  Recommended eating smaller high protein, low fat meals more frequently and exercising 30 mins a day 5 times a week with a goal of 10-15lb weight loss in the next 3 months. Patient voiced their understanding and motivation to adhere to these recommendations.

## 2023-02-03 NOTE — Assessment & Plan Note (Signed)
Chronic, stable.  Continue Omeprazole as needed, does not use often.  Risks of PPI use were discussed with patient including bone loss, C. Diff diarrhea, pneumonia, infections, CKD, electrolyte abnormalities.  Verbalizes understanding and chooses to continue the medication.

## 2023-02-04 LAB — CBC WITH DIFFERENTIAL/PLATELET
Basophils Absolute: 0.1 10*3/uL (ref 0.0–0.2)
Basos: 1 %
EOS (ABSOLUTE): 0.2 10*3/uL (ref 0.0–0.4)
Eos: 3 %
Hematocrit: 43.9 % (ref 34.0–46.6)
Hemoglobin: 14 g/dL (ref 11.1–15.9)
Immature Grans (Abs): 0.1 10*3/uL (ref 0.0–0.1)
Immature Granulocytes: 1 %
Lymphocytes Absolute: 1.9 10*3/uL (ref 0.7–3.1)
Lymphs: 28 %
MCH: 28.8 pg (ref 26.6–33.0)
MCHC: 31.9 g/dL (ref 31.5–35.7)
MCV: 90 fL (ref 79–97)
Monocytes Absolute: 0.4 10*3/uL (ref 0.1–0.9)
Monocytes: 6 %
Neutrophils Absolute: 4.2 10*3/uL (ref 1.4–7.0)
Neutrophils: 61 %
Platelets: 337 10*3/uL (ref 150–450)
RBC: 4.86 x10E6/uL (ref 3.77–5.28)
RDW: 12.5 % (ref 11.7–15.4)
WBC: 6.8 10*3/uL (ref 3.4–10.8)

## 2023-02-04 LAB — COMPREHENSIVE METABOLIC PANEL
ALT: 13 [IU]/L (ref 0–32)
AST: 18 [IU]/L (ref 0–40)
Albumin: 4.8 g/dL (ref 3.8–4.9)
Alkaline Phosphatase: 112 [IU]/L (ref 44–121)
BUN/Creatinine Ratio: 23 (ref 9–23)
BUN: 15 mg/dL (ref 6–24)
Bilirubin Total: 0.4 mg/dL (ref 0.0–1.2)
CO2: 21 mmol/L (ref 20–29)
Calcium: 9.6 mg/dL (ref 8.7–10.2)
Chloride: 102 mmol/L (ref 96–106)
Creatinine, Ser: 0.64 mg/dL (ref 0.57–1.00)
Globulin, Total: 2.1 g/dL (ref 1.5–4.5)
Glucose: 91 mg/dL (ref 70–99)
Potassium: 4.3 mmol/L (ref 3.5–5.2)
Sodium: 139 mmol/L (ref 134–144)
Total Protein: 6.9 g/dL (ref 6.0–8.5)
eGFR: 103 mL/min/{1.73_m2} (ref >59)

## 2023-02-04 LAB — LIPID PANEL W/O CHOL/HDL RATIO
Cholesterol, Total: 178 mg/dL (ref 100–199)
HDL: 69 mg/dL (ref >39)
LDL Chol Calc (NIH): 87 mg/dL (ref 0–99)
Triglycerides: 127 mg/dL (ref 0–149)
VLDL Cholesterol Cal: 22 mg/dL (ref 5–40)

## 2023-02-04 LAB — TSH: TSH: 1.88 u[IU]/mL (ref 0.450–4.500)

## 2023-02-04 LAB — VITAMIN D 25 HYDROXY (VIT D DEFICIENCY, FRACTURES): Vit D, 25-Hydroxy: 38.1 ng/mL (ref 30.0–100.0)

## 2023-02-04 NOTE — Progress Notes (Signed)
Contacted via MyChart   Good morning [name], your labs have returned and look great.  No changes needed.  Great job!! Keep being amazing!!  Thank you for allowing me to participate in your care.  I appreciate you. Kindest regards, Katniss Weedman

## 2023-07-01 ENCOUNTER — Ambulatory Visit: Payer: Self-pay

## 2023-07-29 ENCOUNTER — Ambulatory Visit: Payer: Self-pay

## 2023-07-31 ENCOUNTER — Ambulatory Visit
Admission: RE | Admit: 2023-07-31 | Discharge: 2023-07-31 | Disposition: A | Discharge status: Home / Self Care | Payer: Self-pay | Source: Ambulatory Visit | Attending: Nurse Practitioner | Admitting: Nurse Practitioner

## 2023-07-31 DIAGNOSIS — Z1231 Encounter for screening mammogram for malignant neoplasm of breast: Secondary | ICD-10-CM | POA: Insufficient documentation

## 2023-08-03 NOTE — Patient Instructions (Signed)
 Be Involved in Caring For Your Health:  Taking Medications When medications are taken as directed, they can greatly improve your health. But if they are not taken as prescribed, they may not work. In some cases, not taking them correctly can be harmful. To help ensure your treatment remains effective and safe, understand your medications and how to take them. Bring your medications to each visit for review by your provider.  Your lab results, notes, and after visit summary will be available on My Chart. We strongly encourage you to use this feature. If lab results are abnormal the clinic will contact you with the appropriate steps. If the clinic does not contact you assume the results are satisfactory. You can always view your results on My Chart. If you have questions regarding your health or results, please contact the clinic during office hours. You can also ask questions on My Chart.  We at [facility] are grateful that you chose [place] to provide your care. We strive to provide evidence-based and compassionate care and are always looking for feedback. If you get a survey from the clinic please complete this so we can hear your opinions.  Prediabetes: What to Know Prediabetes is when your blood sugar, also called glucose, is at a higher level than normal but not high enough for you to be diagnosed with type 2 diabetes (type 2 diabetes mellitus). Having prediabetes puts you at risk for getting type 2 diabetes. By making some healthy changes, you may be able to prevent or delay getting type 2 diabetes. This is important because type 2 diabetes can lead to serious problems. Some of these include: Heart disease. Stroke. Blindness. Kidney disease. Depression. Poor blood flow in the feet and legs. In very bad cases, this could lead to having a leg removed by surgery (amputation). What are the causes? The exact cause of prediabetes isn't known. It may result from insulin resistance. Insulin  resistance happens when cells in the body don't respond properly to insulin that the body makes. This can cause too much sugar to build up in the blood. High blood sugar, also called hyperglycemia, can develop. What increases the risk? Having a family member with type 2 diabetes. Being older than 58 years of age. Having had a temporary form of diabetes during a pregnancy. This is called gestational diabetes. Having had polycystic ovary syndrome (PCOS). Being overweight or obese. Being inactive and not getting much exercise. Having a history of heart disease. This may include problems with cholesterol levels, high levels of blood fats, or high blood pressure. What are the signs or symptoms? You may have no symptoms. If you do have symptoms, they may include: Increased hunger. Increased thirst. Needing to pee more often. Changes in how you see, like blurry vision. Feeling tired. How is this diagnosed? Prediabetes can be diagnosed with blood tests that check your blood sugar. One or more of these tests may be done: A fasting blood glucose (FBG) test. You won't be allowed to eat (you will fast) for at least 8 hours before a blood sample is taken. An A1C blood test, also called a hemoglobin A1C test. This test shows information about blood sugar levels over the past 2?3 months. An oral glucose tolerance test (OGTT). This test measures your blood sugar at two points in time: After you haven't eaten for a while. This is your baseline level. Two hours after you drink a beverage that has sugar in it. You may be diagnosed with prediabetes  if: Your FBG is 100?125 mg/dL (1.6-1.0 mmol/L). Your A1C level is 5.7?6.4% (39-46 mmol/mol). Your OGTT result is 140?199 mg/dL (9.6-04 mmol/L). These blood tests may need to be done again to be sure of the diagnosis. How is this treated? Treatment may include making changes to your diet and lifestyle. These changes can help lower your blood sugar and keep you  from getting type 2 diabetes. In some cases, medicine may be given to help lower your risk. Follow these instructions at home: Eating and drinking  Eat and drink as told. Follow a healthy meal plan. This includes eating lean proteins, whole grains, legumes, fresh fruits and vegetables, low-fat dairy products, and healthy fats. Meet with an expert in healthy eating called a dietitian. This person can help create a healthy eating plan that's right for you. Lifestyle Do moderate-intensity exercise. Do this for at least 30 minutes a day on 5 or more days each week, or as told by your health care provider. A mix of activities may be best. Good choices include brisk walking, swimming, biking, and weight lifting. Try to lose weight if your provider says it's OK. Losing 5-7% of your body weight can help reverse insulin resistance. Do not drink alcohol if: Your provider tells you not to drink. You're pregnant, may be pregnant, or plan to become pregnant. If you drink alcohol: Limit how much you have to: 0-1 drink a day if you're female. 0-2 drinks a day if you're female. Know how much alcohol is in your drink. In the [place] one drink is one 12 oz bottle of beer (355 mL), one 5 oz glass of wine (148 mL), or one 1 oz glass of hard liquor (44 mL). General instructions Take medicines only as told. You may be given medicines that help lower the risk of type 2 diabetes. Do not smoke, vape, or use nicotine or tobacco. Where to find more information American Diabetes Association: diabetes.org/about-diabetes/prediabetes Academy of Nutrition and Dietetics: eatright.org American Heart Association: Go to ThisJobs.cz. Click the search icon. Type "prediabetes" in the search box. Contact a health care provider if: You have any of these symptoms: Increased hunger. Peeing more often than usual. Increased thirst. Feeling tired. Changes in how you see, like blurry vision. Feeling like you may throw  up. Throwing up. Get help right away if: You have shortness of breath. You feel confused. This information is not intended to replace advice given to you by your health care provider. Make sure you discuss any questions you have with your health care provider. Document Revised: 09/29/2022 Document Reviewed: 09/29/2022 Elsevier Patient Education  2024 [employer].

## 2023-08-05 ENCOUNTER — Encounter: Payer: Self-pay | Admitting: Nurse Practitioner

## 2023-08-05 ENCOUNTER — Ambulatory Visit: Payer: Self-pay | Admitting: Nurse Practitioner

## 2023-08-05 VITALS — BP 119/79 | HR 82 | Temp 98.2°F | Ht 63.5 in | Wt 208.0 lb

## 2023-08-05 DIAGNOSIS — I10 Essential (primary) hypertension: Secondary | ICD-10-CM

## 2023-08-05 DIAGNOSIS — E6609 Other obesity due to excess calories: Secondary | ICD-10-CM

## 2023-08-05 DIAGNOSIS — Z6836 Body mass index (BMI) 36.0-36.9, adult: Secondary | ICD-10-CM

## 2023-08-05 DIAGNOSIS — R7301 Impaired fasting glucose: Secondary | ICD-10-CM | POA: Diagnosis not present

## 2023-08-05 DIAGNOSIS — E66812 Obesity, class 2: Secondary | ICD-10-CM

## 2023-08-05 DIAGNOSIS — F4321 Adjustment disorder with depressed mood: Secondary | ICD-10-CM

## 2023-08-05 DIAGNOSIS — E782 Mixed hyperlipidemia: Secondary | ICD-10-CM

## 2023-08-05 DIAGNOSIS — N951 Menopausal and female climacteric states: Secondary | ICD-10-CM

## 2023-08-05 LAB — BAYER DCA HB A1C WAIVED: HB A1C (BAYER DCA - WAIVED): 6 % — ABNORMAL HIGH (ref 4.8–5.6)

## 2023-08-05 LAB — MICROALBUMIN, URINE WAIVED
Creatinine, Urine Waived: 200 mg/dL (ref 10–300)
Microalb, Ur Waived: 30 mg/L — ABNORMAL HIGH (ref 0–19)
Microalb/Creat Ratio: <30 mg/g (ref <30)

## 2023-08-05 NOTE — Assessment & Plan Note (Signed)
 Chronic, stable.  BP well at goal in office today. Continue Lisinopril  at 40 MG and Amlodipine  10 MG daily as offering benefit. Recommend she monitor BP at least a few mornings a week at home and document.  DASH diet at home.  Labs today: CMP and urine ALB.  Urine ALB 08 Aug 2023, continue ACE for kidney protection. Return in 6 months.

## 2023-08-05 NOTE — Assessment & Plan Note (Signed)
 Chronic, stable with Sertraline at 100 MG.  Continue this current dose and will adjust in future, possibly reduce if improvement in symptoms and stressors.  Denies SI/HI.

## 2023-08-05 NOTE — Assessment & Plan Note (Signed)
 BMI 36.27.  Recommended eating smaller high protein, low fat meals more frequently and exercising 30 mins a day 5 times a week with a goal of 10-15lb weight loss in the next 3 months. Patient voiced their understanding and motivation to adhere to these recommendations.

## 2023-08-05 NOTE — Assessment & Plan Note (Signed)
 History of gestational diabetes, A1c 6% today, mild trend up.  Start medication as needed.

## 2023-08-05 NOTE — Assessment & Plan Note (Signed)
 Chronic, ongoing.  Continue Rosuvastatin and adjust as needed.  Check lipid panel today.

## 2023-08-05 NOTE — Progress Notes (Signed)
 BP 119/79   Pulse 82   Temp 98.2 F (36.8 C) (Oral)   Ht 5' 3.5" (1.613 m)   Wt 208 lb (94.3 kg)   LMP 02/18/2012   SpO2 98%   BMI 36.27 kg/m    Subjective:    Patient ID: Shannon Marsh, female    DOB: April 20, 1965, 58 y.o.   MRN: [id number]  HPI: Shannon Marsh is a 58 y.o. female  Chief Complaint  Patient presents with   Depression   Hyperlipidemia   Hypertension   IFG   HYPERTENSION / HYPERLIPIDEMIA Continues Amlodipine  10 MG daily, Lisinopril  40 MG daily, and Crestor  10 MG daily.  Satisfied with current treatment? yes Duration of hypertension: chronic BP monitoring frequency: occasional BP range: 110/80 range on average BP medication side effects: no Duration of hyperlipidemia: chronic Cholesterol medication side effects: no Cholesterol supplements: none Medication compliance: good compliance Aspirin: no Recent stressors: no Recurrent headaches: no Visual changes: no Palpitations: no Dyspnea: no Chest pain: no Lower extremity edema: only if on feet for awhile Dizzy/lightheaded: no The 10-year ASCVD risk score (Arnett DK, et al., 2019) is: 2.1%   Values used to calculate the score:     Age: 56 years     Sex: Female     Is Non-Hispanic African American: No     Diabetic: No     Tobacco smoker: No     Systolic Blood Pressure: 119 mmHg     Is BP treated: Yes     HDL Cholesterol: 69 mg/dL     Total Cholesterol: 178 mg/dL  Impaired Fasting Glucose HbA1C:  Lab Results  Component Value Date   HGBA1C 6.0 (H) 08/05/2023  Duration of elevated blood sugar: years Polydipsia: no Polyuria: no Weight change: no Visual disturbance: no Glucose Monitoring: no    Accucheck frequency: Not Checking    Fasting glucose:     Post prandial:  Diabetic Education: Not Completed Family history of diabetes: yes -- both sides of family (mother and father)   DEPRESSION Taking Zoloft  100 MG daily -- will occasionally take 1/2 a pill (50 MG). Mood status:  controlled Satisfied with current treatment?: yes Symptom severity: mild  Duration of current treatment : chronic Side effects: no Medication compliance: good compliance Psychotherapy/counseling: none Depressed mood: no Anxious mood: no Anhedonia: no Significant weight loss or gain: no Insomnia: occasional Fatigue: no Feelings of worthlessness or guilt: no Impaired concentration/indecisiveness: no Suicidal ideations: no Hopelessness: no Crying spells: no    08/05/2023    9:15 AM 02/03/2023    1:13 PM 07/30/2022   10:25 AM 01/29/2022    1:15 PM 09/25/2021    9:15 AM  Depression screen PHQ 2/9  Decreased Interest 0 0 0 0 0  Down, Depressed, Hopeless 0 0 0 0 0  PHQ - 2 Score 0 0 0 0 0  Altered sleeping 1 0 1 1 0  Tired, decreased energy 0 0 0 0 0  Change in appetite 0 1 0 1 0  Feeling bad or failure about yourself  0 0 0 0 0  Trouble concentrating 0 0 0 0 1  Moving slowly or fidgety/restless 0 0 0 0 0  Suicidal thoughts 0 0 0 0 0  PHQ-9 Score 1 1 1 2 1   Difficult doing work/chores Not difficult at all Not difficult at all Not difficult at all Not difficult at all Not difficult at all       08/05/2023    9:16 AM 02/03/2023  1:13 PM 07/30/2022   10:25 AM 01/29/2022    1:15 PM  GAD 7 : Generalized Anxiety Score  Nervous, Anxious, on Edge 0 0 0 0  Control/stop worrying 0 0 0 0  Worry too much - different things 0 0 0 0  Trouble relaxing 0 0 0 0  Restless 0 0 0 0  Easily annoyed or irritable 0 0 0 0  Afraid - awful might happen 0 0 0 0  Total GAD 7 Score 0 0 0 0  Anxiety Difficulty Not difficult at all Not difficult at all Not difficult at all Not difficult at all   Relevant past medical, surgical, family and social history reviewed and updated as indicated. Interim medical history since our last visit reviewed. Allergies and medications reviewed and updated.  Review of Systems  Constitutional:  Negative for activity change, appetite change, diaphoresis, fatigue and  fever.  Respiratory:  Negative for cough, chest tightness and shortness of breath.   Cardiovascular:  Negative for chest pain, palpitations and leg swelling.  Gastrointestinal: Negative.   Neurological: Negative.   Psychiatric/Behavioral: Negative.      Per HPI unless specifically indicated above     Objective:    BP 119/79   Pulse 82   Temp 98.2 F (36.8 C) (Oral)   Ht 5' 3.5" (1.613 m)   Wt 208 lb (94.3 kg)   LMP 02/18/2012   SpO2 98%   BMI 36.27 kg/m   Wt Readings from Last 3 Encounters:  08/05/23 208 lb (94.3 kg)  02/03/23 206 lb 9.6 oz (93.7 kg)  10/01/22 197 lb (89.4 kg)    Physical Exam Vitals and nursing note reviewed.  Constitutional:      General: She is awake. She is not in acute distress.    Appearance: She is well-developed. She is obese. She is not ill-appearing.  HENT:     Head: Normocephalic.     Right Ear: Hearing normal.     Left Ear: Hearing normal.  Eyes:     General: Lids are normal.        Right eye: No discharge.        Left eye: No discharge.     Conjunctiva/sclera: Conjunctivae normal.     Pupils: Pupils are equal, round, and reactive to light.  Neck:     Thyroid: No thyromegaly.     Vascular: No carotid bruit.  Cardiovascular:     Rate and Rhythm: Normal rate and regular rhythm.     Heart sounds: Normal heart sounds. No murmur heard.    No gallop.  Pulmonary:     Effort: Pulmonary effort is normal. No accessory muscle usage or respiratory distress.     Breath sounds: Normal breath sounds.  Abdominal:     General: Bowel sounds are normal.     Palpations: Abdomen is soft.  Musculoskeletal:     Cervical back: Normal range of motion and neck supple.     Right lower leg: No edema.     Left lower leg: No edema.  Skin:    General: Skin is warm and dry.  Neurological:     Mental Status: She is alert and oriented to person, place, and time.  Psychiatric:        Attention and Perception: Attention normal.        Mood and Affect: Mood  normal.        Speech: Speech normal.        Behavior: Behavior normal. Behavior is cooperative.  Thought Content: Thought content normal.    Results for orders placed or performed in visit on 08/05/23  Bayer DCA Hb A1c Waived   Collection Time: 08/05/23  9:16 AM  Result Value Ref Range   HB A1C (BAYER DCA - WAIVED) 6.0 (H) 4.8 - 5.6 %  Microalbumin, Urine Waived   Collection Time: 08/05/23  9:16 AM  Result Value Ref Range   Microalb, Ur Waived 30 (H) 0 - 19 mg/L   Creatinine, Urine Waived 200 10 - 300 mg/dL   Microalb/Creat Ratio <30 <30 mg/g      Assessment & Plan:   Problem List Items Addressed This Visit       Cardiovascular and Mediastinum   Benign hypertension - Primary   Chronic, stable.  BP well at goal in office today. Continue Lisinopril  at 40 MG and Amlodipine  10 MG daily as offering benefit. Recommend she monitor BP at least a few mornings a week at home and document.  DASH diet at home.  Labs today: CMP and urine ALB.  Urine ALB 08 Aug 2023, continue ACE for kidney protection. Return in 6 months.       Relevant Orders   Microalbumin, Urine Waived (Completed)   Comprehensive metabolic panel with GFR     Endocrine   IFG (impaired fasting glucose)   History of gestational diabetes, A1c 6% today, mild trend up.  Start medication as needed.      Relevant Orders   Bayer DCA Hb A1c Waived (Completed)   Microalbumin, Urine Waived (Completed)   Comprehensive metabolic panel with GFR     Other   Situational depression   Chronic, stable with Sertraline  at 100 MG.  Continue this current dose and will adjust in future, possibly reduce if improvement in symptoms and stressors.  Denies SI/HI.        Obesity   BMI 36.27.  Recommended eating smaller high protein, low fat meals more frequently and exercising 30 mins a day 5 times a week with a goal of 10-15lb weight loss in the next 3 months. Patient voiced their understanding and motivation to adhere to these  recommendations.       Mixed hyperlipidemia   Chronic, ongoing.  Continue Rosuvastatin  and adjust as needed.  Check lipid panel today.      Relevant Orders   Comprehensive metabolic panel with GFR   Lipid Panel w/o Chol/HDL Ratio     Follow up plan: Return in about 6 months (around 02/05/2024) for Annual Physical after 02/03/24.

## 2023-08-06 ENCOUNTER — Ambulatory Visit: Payer: Self-pay | Admitting: Nurse Practitioner

## 2023-08-06 LAB — COMPREHENSIVE METABOLIC PANEL WITH GFR
ALT: 13 IU/L (ref 0–32)
AST: 14 IU/L (ref 0–40)
Albumin: 4.5 g/dL (ref 3.8–4.9)
Alkaline Phosphatase: 103 IU/L (ref 44–121)
BUN/Creatinine Ratio: 24 — ABNORMAL HIGH (ref 9–23)
BUN: 17 mg/dL (ref 6–24)
Bilirubin Total: 0.3 mg/dL (ref 0.0–1.2)
CO2: 20 mmol/L (ref 20–29)
Calcium: 9.7 mg/dL (ref 8.7–10.2)
Chloride: 103 mmol/L (ref 96–106)
Creatinine, Ser: 0.7 mg/dL (ref 0.57–1.00)
Globulin, Total: 2.4 g/dL (ref 1.5–4.5)
Glucose: 118 mg/dL — ABNORMAL HIGH (ref 70–99)
Potassium: 4.3 mmol/L (ref 3.5–5.2)
Sodium: 141 mmol/L (ref 134–144)
Total Protein: 6.9 g/dL (ref 6.0–8.5)
eGFR: 101 mL/min/{1.73_m2} (ref >59)

## 2023-08-06 LAB — LIPID PANEL W/O CHOL/HDL RATIO
Cholesterol, Total: 166 mg/dL (ref 100–199)
HDL: 59 mg/dL (ref >39)
LDL Chol Calc (NIH): 78 mg/dL (ref 0–99)
Triglycerides: 171 mg/dL — ABNORMAL HIGH (ref 0–149)
VLDL Cholesterol Cal: 29 mg/dL (ref 5–40)

## 2023-08-06 NOTE — Progress Notes (Signed)
 Contacted via MyChart   Good afternoon Shannon Marsh, your labs have returned: - Kidney function, creatinine and eGFR, remains normal, as is liver function, AST and ALT.  - Lipid panel shows stable LDL level with Rosuvastatin  on board.  Continue all medications.  Any questions? Keep being stellar!!  Thank you for allowing me to participate in your care.  I appreciate you. Kindest regards, Juliany Daughety

## 2023-08-07 ENCOUNTER — Other Ambulatory Visit: Payer: Self-pay | Admitting: Nurse Practitioner

## 2023-08-07 ENCOUNTER — Ambulatory Visit: Payer: Self-pay | Admitting: Nurse Practitioner

## 2023-08-07 DIAGNOSIS — R928 Other abnormal and inconclusive findings on diagnostic imaging of breast: Secondary | ICD-10-CM

## 2023-08-07 NOTE — Progress Notes (Signed)
 Contacted via MyChart  Good morning Shannon Marsh, your mammogram returned showing the need for extra imaging.  Please reach out to [facility] to schedule: Please call to schedule your mammogram and/or bone density: [facility]  Address: [address], [place], [place] [address] Phone: [phone]  [facility] [address] [place],  [place]  [address] Phone: [phone]

## 2023-08-12 ENCOUNTER — Ambulatory Visit
Admission: RE | Admit: 2023-08-12 | Discharge: 2023-08-12 | Disposition: A | Discharge status: Home / Self Care | Source: Ambulatory Visit | Attending: Nurse Practitioner | Admitting: Nurse Practitioner

## 2023-08-12 DIAGNOSIS — R928 Other abnormal and inconclusive findings on diagnostic imaging of breast: Secondary | ICD-10-CM

## 2023-08-22 ENCOUNTER — Other Ambulatory Visit: Payer: Self-pay | Admitting: Nurse Practitioner

## 2023-08-22 NOTE — Telephone Encounter (Signed)
 Requested Prescriptions  Pending Prescriptions Disp Refills   amLODipine  (NORVASC ) 10 MG tablet [Pharmacy Med Name: AMLODIPINE  BESYLATE 10 MG TAB] 90 tablet 0    Sig: TAKE 1 TABLET BY MOUTH ONCE DAILY     Cardiovascular: Calcium  Channel Blockers 2 Passed - 08/22/2023 11:29 AM      Passed - Last BP in normal range    BP Readings from Last 1 Encounters:  08/05/23 119/79         Passed - Last Heart Rate in normal range    Pulse Readings from Last 1 Encounters:  08/05/23 82         Passed - Valid encounter within last 6 months    Recent Outpatient Visits           2 weeks ago Benign hypertension   [facility] [place], Lavelle Posey, NP       Future Appointments             In 5 months Cannady, Jolene T, NP [place] [facility], [facility]

## 2023-08-22 NOTE — Telephone Encounter (Signed)
 Requested Prescriptions  Pending Prescriptions Disp Refills   lisinopril  (ZESTRIL ) 40 MG tablet [Pharmacy Med Name: LISINOPRIL  40 MG TAB] 90 tablet 1    Sig: TAKE 1 TABLET BY MOUTH ONCE DAILY     Cardiovascular:  ACE Inhibitors Passed - 08/22/2023  2:42 PM      Passed - Cr in normal range and within 180 days    Creatinine  Date Value Ref Range Status  03/12/2014 0.60 0.60 - 1.30 mg/dL Final   Creatinine, Ser  Date Value Ref Range Status  08/05/2023 0.70 0.57 - 1.00 mg/dL Final         Passed - K in normal range and within 180 days    Potassium  Date Value Ref Range Status  08/05/2023 4.3 3.5 - 5.2 mmol/L Final  03/12/2014 3.6 3.5 - 5.1 mmol/L Final         Passed - Patient is not pregnant      Passed - Last BP in normal range    BP Readings from Last 1 Encounters:  08/05/23 119/79         Passed - Valid encounter within last 6 months    Recent Outpatient Visits           2 weeks ago Benign hypertension   [facility] [place], Lavelle Posey, NP       Future Appointments             In 5 months Cannady, Jolene T, NP [facility], [facility]

## 2023-09-09 ENCOUNTER — Other Ambulatory Visit: Payer: Self-pay | Admitting: Nurse Practitioner

## 2023-09-10 ENCOUNTER — Other Ambulatory Visit: Payer: Self-pay | Admitting: Nurse Practitioner

## 2023-09-10 NOTE — Telephone Encounter (Signed)
 Mandy with [facility] called stating she sent this refill request manually in several days ago and back in yesterday. She says the patient has been without it for 2-3 days and really needs it. Please assist patient further as soon as possible.

## 2023-09-12 NOTE — Telephone Encounter (Signed)
 Requested Prescriptions  Pending Prescriptions Disp Refills   rosuvastatin  (CRESTOR ) 10 MG tablet [Pharmacy Med Name: ROSUVASTATIN  CALCIUM  10 MG TAB] 90 tablet 3    Sig: TAKE 1 TABLET BY MOUTH ONCE EVERY EVENING     Cardiovascular:  Antilipid - Statins 2 Failed - 09/12/2023  5:35 PM      Failed - Lipid Panel in normal range within the last 12 months    Cholesterol, Total  Date Value Ref Range Status  08/05/2023 166 100 - 199 mg/dL Final   LDL Chol Calc (NIH)  Date Value Ref Range Status  08/05/2023 78 0 - 99 mg/dL Final   HDL  Date Value Ref Range Status  08/05/2023 59 >39 mg/dL Final   Triglycerides  Date Value Ref Range Status  08/05/2023 171 (H) 0 - 149 mg/dL Final         Passed - Cr in normal range and within 360 days    Creatinine  Date Value Ref Range Status  03/12/2014 0.60 0.60 - 1.30 mg/dL Final   Creatinine, Ser  Date Value Ref Range Status  08/05/2023 0.70 0.57 - 1.00 mg/dL Final         Passed - Patient is not pregnant      Passed - Valid encounter within last 12 months    Recent Outpatient Visits           1 month ago Benign hypertension   [place] [facility] [place], Melanie DASEN, NP       Future Appointments             In 5 months Cannady, Jolene T, NP [facility], [facility]

## 2023-11-22 ENCOUNTER — Other Ambulatory Visit: Payer: Self-pay | Admitting: Nurse Practitioner

## 2023-11-24 NOTE — Telephone Encounter (Signed)
 Requested Prescriptions  Pending Prescriptions Disp Refills   amLODipine  (NORVASC ) 10 MG tablet [Pharmacy Med Name: AMLODIPINE  BESYLATE 10 MG TAB] 90 tablet 0    Sig: TAKE 1 TABLET BY MOUTH ONCE DAILY     Cardiovascular: Calcium  Channel Blockers 2 Passed - 11/24/2023  4:15 PM      Passed - Last BP in normal range    BP Readings from Last 1 Encounters:  08/05/23 119/79         Passed - Last Heart Rate in normal range    Pulse Readings from Last 1 Encounters:  08/05/23 82         Passed - Valid encounter within last 6 months    Recent Outpatient Visits           3 months ago Benign hypertension   [place] [facility] [place], Melanie DASEN, NP       Future Appointments             In 2 months Cannady, Jolene T, NP [facility], [address]

## 2024-02-07 NOTE — Patient Instructions (Incomplete)
 Be Involved in Caring For Your Health:  Taking Medications When medications are taken as directed, they can greatly improve your health. But if they are not taken as prescribed, they may not work. In some cases, not taking them correctly can be harmful. To help ensure your treatment remains effective and safe, understand your medications and how to take them. Bring your medications to each visit for review by your provider.  Your lab results, notes, and after visit summary will be available on My Chart. We strongly encourage you to use this feature. If lab results are abnormal the clinic will contact you with the appropriate steps. If the clinic does not contact you assume the results are satisfactory. You can always view your results on My Chart. If you have questions regarding your health or results, please contact the clinic during office hours. You can also ask questions on My Chart.  We at [facility] are grateful that you chose us  to provide your care. We strive to provide evidence-based and compassionate care and are always looking for feedback. If you get a survey from the clinic please complete this so we can hear your opinions.  DASH Eating Plan DASH stands for Dietary Approaches to Stop Hypertension. The DASH eating plan is a healthy eating plan that has been shown to: Lower high blood pressure (hypertension). Reduce your risk for type 2 diabetes, heart disease, and stroke. Help with weight loss. What are tips for following this plan? Reading food labels Check food labels for the amount of salt (sodium) per serving. Choose foods with less than 5 percent of the Daily Value (DV) of sodium. In general, foods with less than 300 milligrams (mg) of sodium per serving fit into this eating plan. To find whole grains, look for the word whole as the first word in the ingredient list. Shopping Buy products labeled as low-sodium or no salt added. Buy fresh foods. Avoid canned  foods and pre-made or frozen meals. Cooking Try not to add salt when you cook. Use salt-free seasonings or herbs instead of table salt or sea salt. Check with your health care provider or pharmacist before using salt substitutes. Do not fry foods. Cook foods in healthy ways, such as baking, boiling, grilling, roasting, or broiling. Cook using oils that are good for your heart. These include olive, canola, avocado, soybean, and sunflower oil. Meal planning  Eat a balanced diet. This should include: 4 or more servings of fruits and 4 or more servings of vegetables each day. Try to fill half of your plate with fruits and vegetables. 6-8 servings of whole grains each day. 6 or less servings of lean meat, poultry, or fish each day. 1 oz is 1 serving. A 3 oz (85 g) serving of meat is about the same size as the palm of your hand. One egg is 1 oz (28 g). 2-3 servings of low-fat dairy each day. One serving is 1 cup (237 mL). 1 serving of nuts, seeds, or beans 5 times each week. 2-3 servings of heart-healthy fats. Healthy fats called omega-3 fatty acids are found in foods such as walnuts, flaxseeds, fortified milks, and eggs. These fats are also found in cold-water fish, such as sardines, salmon, and mackerel. Limit how much you eat of: Canned or prepackaged foods. Food that is high in trans fat, such as fried foods. Food that is high in saturated fat, such as fatty meat. Desserts and other sweets, sugary drinks, and other foods with added sugar. Full-fat  dairy products. Do not salt foods before eating. Do not eat more than 4 egg yolks a week. Try to eat at least 2 vegetarian meals a week. Eat more home-cooked food and less restaurant, buffet, and fast food. Lifestyle When eating at a restaurant, ask if your food can be made with less salt or no salt. If you drink alcohol: Limit how much you have to: 0-1 drink a day if you are female. 0-2 drinks a day if you are female. Know how much alcohol is in  your drink. In the [place] one drink is one 12 oz bottle of beer (355 mL), one 5 oz glass of wine (148 mL), or one 1 oz glass of hard liquor (44 mL). General information Avoid eating more than 2,300 mg of salt a day. If you have hypertension, you may need to reduce your sodium intake to 1,500 mg a day. Work with your provider to stay at a healthy body weight or lose weight. Ask what the best weight range is for you. On most days of the week, get at least 30 minutes of exercise that causes your heart to beat faster. This may include walking, swimming, or biking. Work with your provider or dietitian to adjust your eating plan to meet your specific calorie needs. What foods should I eat? Fruits All fresh, dried, or frozen fruit. Canned fruits that are in their natural juice and do not have sugar added to them. Vegetables Fresh or frozen vegetables that are raw, steamed, roasted, or grilled. Low-sodium or reduced-sodium tomato and vegetable juice. Low-sodium or reduced-sodium tomato sauce and tomato paste. Low-sodium or reduced-sodium canned vegetables. Grains Whole-grain or whole-wheat bread. Whole-grain or whole-wheat pasta. Brown rice. Mcneil Madeira. Bulgur. Whole-grain and low-sodium cereals. Pita bread. Low-fat, low-sodium crackers. Whole-wheat flour tortillas. Meats and other proteins Skinless chicken or [place]. Ground chicken or [place]. Pork with fat trimmed off. Fish and seafood. Egg whites. Dried beans, peas, or lentils. Unsalted nuts, nut butters, and seeds. Unsalted canned beans. Lean cuts of beef with fat trimmed off. Low-sodium, lean precooked or cured meat, such as sausages or meat loaves. Dairy Low-fat (1%) or fat-free (skim) milk. Reduced-fat, low-fat, or fat-free cheeses. Nonfat, low-sodium ricotta or cottage cheese. Low-fat or nonfat yogurt. Low-fat, low-sodium cheese. Fats and oils Soft margarine without trans fats. Vegetable oil. Reduced-fat, low-fat, or light mayonnaise and salad  dressings (reduced-sodium). Canola, safflower, olive, avocado, soybean, and sunflower oils. Avocado. Seasonings and condiments Herbs. Spices. Seasoning mixes without salt. Other foods Unsalted popcorn and pretzels. Fat-free sweets. The items listed above may not be all the foods and drinks you can have. Talk to a dietitian to learn more. What foods should I avoid? Fruits Canned fruit in a light or heavy syrup. Fried fruit. Fruit in cream or butter sauce. Vegetables Creamed or fried vegetables. Vegetables in a cheese sauce. Regular canned vegetables that are not marked as low-sodium or reduced-sodium. Regular canned tomato sauce and paste that are not marked as low-sodium or reduced-sodium. Regular tomato and vegetable juices that are not marked as low-sodium or reduced-sodium. Dene. Olives. Grains Baked goods made with fat, such as croissants, muffins, or some breads. Dry pasta or rice meal packs. Meats and other proteins Fatty cuts of meat. Ribs. Fried meat. Ald[place]. Bologna, salami, and other precooked or cured meats, such as sausages or meat loaves, that are not lean and low in sodium. Fat from the back of a pig (fatback). Bratwurst. Salted nuts and seeds. Canned beans with added salt. Canned  or smoked fish. Whole eggs or egg yolks. Chicken or [place] with skin. Dairy Whole or 2% milk, cream, and half-and-half. Whole or full-fat cream cheese. Whole-fat or sweetened yogurt. Full-fat cheese. Nondairy creamers. Whipped toppings. Processed cheese and cheese spreads. Fats and oils Butter. Stick margarine. Lard. Shortening. Ghee. Bacon fat. Tropical oils, such as coconut, palm kernel, or palm oil. Seasonings and condiments Onion salt, garlic salt, seasoned salt, table salt, and sea salt. Worcestershire sauce. Tartar sauce. Barbecue sauce. Teriyaki sauce. Soy sauce, including reduced-sodium soy sauce. Steak sauce. Canned and packaged gravies. Fish sauce. Oyster sauce. Cocktail sauce. Store-bought  horseradish. Ketchup. Mustard. Meat flavorings and tenderizers. Bouillon cubes. Hot sauces. Pre-made or packaged marinades. Pre-made or packaged taco seasonings. Relishes. Regular salad dressings. Other foods Salted popcorn and pretzels. The items listed above may not be all the foods and drinks you should avoid. Talk to a dietitian to learn more. Where to find more information National Heart, Lung, and Blood Institute (NHLBI): BuffaloDryCleaner.gl American Heart Association (AHA): heart.org Academy of Nutrition and Dietetics: eatright.org National Kidney Foundation (NKF): kidney.org This information is not intended to replace advice given to you by your health care provider. Make sure you discuss any questions you have with your health care provider. Document Revised: 03/14/2022 Document Reviewed: 03/14/2022 Elsevier Patient Education  2024 [employer].

## 2024-02-10 ENCOUNTER — Encounter: Admitting: Nurse Practitioner

## 2024-02-10 DIAGNOSIS — K219 Gastro-esophageal reflux disease without esophagitis: Secondary | ICD-10-CM

## 2024-02-10 DIAGNOSIS — I10 Essential (primary) hypertension: Secondary | ICD-10-CM

## 2024-02-10 DIAGNOSIS — E782 Mixed hyperlipidemia: Secondary | ICD-10-CM

## 2024-02-10 DIAGNOSIS — F4321 Adjustment disorder with depressed mood: Secondary | ICD-10-CM

## 2024-02-10 DIAGNOSIS — E66812 Obesity, class 2: Secondary | ICD-10-CM

## 2024-02-10 DIAGNOSIS — R7301 Impaired fasting glucose: Secondary | ICD-10-CM

## 2024-02-10 DIAGNOSIS — E559 Vitamin D deficiency, unspecified: Secondary | ICD-10-CM

## 2024-02-25 ENCOUNTER — Other Ambulatory Visit: Payer: Self-pay | Admitting: Nurse Practitioner

## 2024-02-27 ENCOUNTER — Other Ambulatory Visit: Payer: Self-pay | Admitting: Nurse Practitioner

## 2024-02-27 NOTE — Telephone Encounter (Unsigned)
 Copied from CRM #[id number]. Topic: Clinical - Medication Refill >> Feb 27, 2024 12:37 PM Joesph B wrote: Medication:  amLODipine  (NORVASC ) 10 MG tablet   Has the patient contacted their pharmacy? Yes (Agent: If no, request that the patient contact the pharmacy for the refill. If patient does not wish to contact the pharmacy document the reason why and proceed with request.) (Agent: If yes, when and what did the pharmacy advise?)  This is the patient's preferred pharmacy:  [facility] - [place] - [address] [place] [address] Phone: [phone] Fax: [phone]  Is this the correct pharmacy for this prescription? Yes If no, delete pharmacy and type the correct one.   Has the prescription been filled recently? Yes  Is the patient out of the medication? Yes  Has the patient been seen for an appointment in the last year OR does the patient have an upcoming appointment? Yes  Can we respond through MyChart? Yes  Agent: Please be advised that Rx refills may take up to 3 business days. We ask that you follow-up with your pharmacy.

## 2024-02-27 NOTE — Telephone Encounter (Signed)
 Requested Prescriptions  Pending Prescriptions Disp Refills   amLODipine  (NORVASC ) 10 MG tablet [Pharmacy Med Name: AMLODIPINE  BESYLATE 10 MG TAB] 90 tablet 0    Sig: TAKE 1 TABLET BY MOUTH ONCE DAILY     Cardiovascular: Calcium  Channel Blockers 2 Failed - 02/27/2024  3:17 PM      Failed - Valid encounter within last 6 months    Recent Outpatient Visits           6 months ago Benign hypertension   [facility] [place], Clare T, NP              Passed - Last BP in normal range    BP Readings from Last 1 Encounters:  08/05/23 119/79         Passed - Last Heart Rate in normal range    Pulse Readings from Last 1 Encounters:  08/05/23 82

## 2024-03-02 NOTE — Telephone Encounter (Signed)
 Duplicate request, refilled 02/27/24.  Requested Prescriptions  Pending Prescriptions Disp Refills   amLODipine  (NORVASC ) 10 MG tablet 90 tablet 0    Sig: Take 1 tablet (10 mg total) by mouth daily.     Cardiovascular: Calcium  Channel Blockers 2 Failed - 03/02/2024  2:40 PM      Failed - Valid encounter within last 6 months    Recent Outpatient Visits           7 months ago Benign hypertension   [facility] [place], [place] T, NP              Passed - Last BP in normal range    BP Readings from Last 1 Encounters:  08/05/23 119/79         Passed - Last Heart Rate in normal range    Pulse Readings from Last 1 Encounters:  08/05/23 82

## 2024-03-08 ENCOUNTER — Other Ambulatory Visit: Payer: Self-pay | Admitting: Nurse Practitioner

## 2024-03-13 NOTE — Patient Instructions (Signed)
 Be Involved in Caring For Your Health:  Taking Medications When medications are taken as directed, they can greatly improve your health. But if they are not taken as prescribed, they may not work. In some cases, not taking them correctly can be harmful. To help ensure your treatment remains effective and safe, understand your medications and how to take them. Bring your medications to each visit for review by your provider.  Your lab results, notes, and after visit summary will be available on My Chart. We strongly encourage you to use this feature. If lab results are abnormal the clinic will contact you with the appropriate steps. If the clinic does not contact you assume the results are satisfactory. You can always view your results on My Chart. If you have questions regarding your health or results, please contact the clinic during office hours. You can also ask questions on My Chart.  We at [facility] are grateful that you chose us  to provide your care. We strive to provide evidence-based and compassionate care and are always looking for feedback. If you get a survey from the clinic please complete this so we can hear your opinions.  DASH Eating Plan DASH stands for Dietary Approaches to Stop Hypertension. The DASH eating plan is a healthy eating plan that has been shown to: Lower high blood pressure (hypertension). Reduce your risk for type 2 diabetes, heart disease, and stroke. Help with weight loss. What are tips for following this plan? Reading food labels Check food labels for the amount of salt (sodium) per serving. Choose foods with less than 5 percent of the Daily Value (DV) of sodium. In general, foods with less than 300 milligrams (mg) of sodium per serving fit into this eating plan. To find whole grains, look for the word whole as the first word in the ingredient list. Shopping Buy products labeled as low-sodium or no salt added. Buy fresh foods. Avoid canned  foods and pre-made or frozen meals. Cooking Try not to add salt when you cook. Use salt-free seasonings or herbs instead of table salt or sea salt. Check with your health care provider or pharmacist before using salt substitutes. Do not fry foods. Cook foods in healthy ways, such as baking, boiling, grilling, roasting, or broiling. Cook using oils that are good for your heart. These include olive, canola, avocado, soybean, and sunflower oil. Meal planning  Eat a balanced diet. This should include: 4 or more servings of fruits and 4 or more servings of vegetables each day. Try to fill half of your plate with fruits and vegetables. 6-8 servings of whole grains each day. 6 or less servings of lean meat, poultry, or fish each day. 1 oz is 1 serving. A 3 oz (85 g) serving of meat is about the same size as the palm of your hand. One egg is 1 oz (28 g). 2-3 servings of low-fat dairy each day. One serving is 1 cup (237 mL). 1 serving of nuts, seeds, or beans 5 times each week. 2-3 servings of heart-healthy fats. Healthy fats called omega-3 fatty acids are found in foods such as walnuts, flaxseeds, fortified milks, and eggs. These fats are also found in cold-water fish, such as sardines, salmon, and mackerel. Limit how much you eat of: Canned or prepackaged foods. Food that is high in trans fat, such as fried foods. Food that is high in saturated fat, such as fatty meat. Desserts and other sweets, sugary drinks, and other foods with added sugar. Full-fat  dairy products. Do not salt foods before eating. Do not eat more than 4 egg yolks a week. Try to eat at least 2 vegetarian meals a week. Eat more home-cooked food and less restaurant, buffet, and fast food. Lifestyle When eating at a restaurant, ask if your food can be made with less salt or no salt. If you drink alcohol: Limit how much you have to: 0-1 drink a day if you are female. 0-2 drinks a day if you are female. Know how much alcohol is in  your drink. In the [place] one drink is one 12 oz bottle of beer (355 mL), one 5 oz glass of wine (148 mL), or one 1 oz glass of hard liquor (44 mL). General information Avoid eating more than 2,300 mg of salt a day. If you have hypertension, you may need to reduce your sodium intake to 1,500 mg a day. Work with your provider to stay at a healthy body weight or lose weight. Ask what the best weight range is for you. On most days of the week, get at least 30 minutes of exercise that causes your heart to beat faster. This may include walking, swimming, or biking. Work with your provider or dietitian to adjust your eating plan to meet your specific calorie needs. What foods should I eat? Fruits All fresh, dried, or frozen fruit. Canned fruits that are in their natural juice and do not have sugar added to them. Vegetables Fresh or frozen vegetables that are raw, steamed, roasted, or grilled. Low-sodium or reduced-sodium tomato and vegetable juice. Low-sodium or reduced-sodium tomato sauce and tomato paste. Low-sodium or reduced-sodium canned vegetables. Grains Whole-grain or whole-wheat bread. Whole-grain or whole-wheat pasta. Brown rice. Mcneil Madeira. Bulgur. Whole-grain and low-sodium cereals. Pita bread. Low-fat, low-sodium crackers. Whole-wheat flour tortillas. Meats and other proteins Skinless chicken or [place]. Ground chicken or [place]. Pork with fat trimmed off. Fish and seafood. Egg whites. Dried beans, peas, or lentils. Unsalted nuts, nut butters, and seeds. Unsalted canned beans. Lean cuts of beef with fat trimmed off. Low-sodium, lean precooked or cured meat, such as sausages or meat loaves. Dairy Low-fat (1%) or fat-free (skim) milk. Reduced-fat, low-fat, or fat-free cheeses. Nonfat, low-sodium ricotta or cottage cheese. Low-fat or nonfat yogurt. Low-fat, low-sodium cheese. Fats and oils Soft margarine without trans fats. Vegetable oil. Reduced-fat, low-fat, or light mayonnaise and salad  dressings (reduced-sodium). Canola, safflower, olive, avocado, soybean, and sunflower oils. Avocado. Seasonings and condiments Herbs. Spices. Seasoning mixes without salt. Other foods Unsalted popcorn and pretzels. Fat-free sweets. The items listed above may not be all the foods and drinks you can have. Talk to a dietitian to learn more. What foods should I avoid? Fruits Canned fruit in a light or heavy syrup. Fried fruit. Fruit in cream or butter sauce. Vegetables Creamed or fried vegetables. Vegetables in a cheese sauce. Regular canned vegetables that are not marked as low-sodium or reduced-sodium. Regular canned tomato sauce and paste that are not marked as low-sodium or reduced-sodium. Regular tomato and vegetable juices that are not marked as low-sodium or reduced-sodium. Dene. Olives. Grains Baked goods made with fat, such as croissants, muffins, or some breads. Dry pasta or rice meal packs. Meats and other proteins Fatty cuts of meat. Ribs. Fried meat. Aldona. Bologna, salami, and other precooked or cured meats, such as sausages or meat loaves, that are not lean and low in sodium. Fat from the back of a pig (fatback). Bratwurst. Salted nuts and seeds. Canned beans with added salt. Canned  or smoked fish. Whole eggs or egg yolks. Chicken or [place] with skin. Dairy Whole or 2% milk, cream, and half-and-half. Whole or full-fat cream cheese. Whole-fat or sweetened yogurt. Full-fat cheese. Nondairy creamers. Whipped toppings. Processed cheese and cheese spreads. Fats and oils Butter. Stick margarine. Lard. Shortening. Ghee. Bacon fat. Tropical oils, such as coconut, palm kernel, or palm oil. Seasonings and condiments Onion salt, garlic salt, seasoned salt, table salt, and sea salt. Worcestershire sauce. Tartar sauce. Barbecue sauce. Teriyaki sauce. Soy sauce, including reduced-sodium soy sauce. Steak sauce. Canned and packaged gravies. Fish sauce. Oyster sauce. Cocktail sauce. Store-bought  horseradish. Ketchup. Mustard. Meat flavorings and tenderizers. Bouillon cubes. Hot sauces. Pre-made or packaged marinades. Pre-made or packaged taco seasonings. Relishes. Regular salad dressings. Other foods Salted popcorn and pretzels. The items listed above may not be all the foods and drinks you should avoid. Talk to a dietitian to learn more. Where to find more information National Heart, Lung, and Blood Institute (NHLBI): BuffaloDryCleaner.gl American Heart Association (AHA): heart.org Academy of Nutrition and Dietetics: eatright.org National Kidney Foundation (NKF): kidney.org This information is not intended to replace advice given to you by your health care provider. Make sure you discuss any questions you have with your health care provider. Document Revised: 03/14/2022 Document Reviewed: 03/14/2022 Elsevier Patient Education  2024 [employer].

## 2024-03-16 ENCOUNTER — Encounter: Payer: Self-pay | Admitting: Nurse Practitioner

## 2024-03-16 ENCOUNTER — Ambulatory Visit: Admitting: Nurse Practitioner

## 2024-03-16 VITALS — BP 122/81 | HR 74 | Temp 98.2°F | Resp 15 | Ht 63.43 in | Wt 205.6 lb

## 2024-03-16 DIAGNOSIS — Z6836 Body mass index (BMI) 36.0-36.9, adult: Secondary | ICD-10-CM | POA: Diagnosis not present

## 2024-03-16 DIAGNOSIS — I10 Essential (primary) hypertension: Secondary | ICD-10-CM

## 2024-03-16 DIAGNOSIS — E6609 Other obesity due to excess calories: Secondary | ICD-10-CM

## 2024-03-16 DIAGNOSIS — E66812 Obesity, class 2: Secondary | ICD-10-CM | POA: Diagnosis not present

## 2024-03-16 DIAGNOSIS — Z0000 Encounter for general adult medical examination without abnormal findings: Secondary | ICD-10-CM | POA: Diagnosis not present

## 2024-03-16 DIAGNOSIS — K219 Gastro-esophageal reflux disease without esophagitis: Secondary | ICD-10-CM | POA: Diagnosis not present

## 2024-03-16 DIAGNOSIS — E559 Vitamin D deficiency, unspecified: Secondary | ICD-10-CM

## 2024-03-16 DIAGNOSIS — F4321 Adjustment disorder with depressed mood: Secondary | ICD-10-CM | POA: Diagnosis not present

## 2024-03-16 DIAGNOSIS — E782 Mixed hyperlipidemia: Secondary | ICD-10-CM | POA: Diagnosis not present

## 2024-03-16 DIAGNOSIS — R7301 Impaired fasting glucose: Secondary | ICD-10-CM

## 2024-03-16 LAB — MICROALBUMIN, URINE WAIVED
Creatinine, Urine Waived: 200 mg/dL (ref 10–300)
Microalb, Ur Waived: 30 mg/L — ABNORMAL HIGH (ref 0–19)
Microalb/Creat Ratio: <30 mg/g

## 2024-03-16 MED ORDER — AMLODIPINE BESYLATE 10 MG PO TABS: 10.0000 mg | ORAL_TABLET | Freq: Every day | ORAL | 4 refills | Status: AC

## 2024-03-16 MED ORDER — SERTRALINE HCL 100 MG PO TABS: 100.0000 mg | ORAL_TABLET | Freq: Every day | ORAL | 4 refills | Status: AC

## 2024-03-16 MED ORDER — LISINOPRIL 40 MG PO TABS: 40.0000 mg | ORAL_TABLET | Freq: Every day | ORAL | 4 refills | Status: AC

## 2024-03-16 NOTE — Assessment & Plan Note (Signed)
 Chronic, ongoing.  Continue Rosuvastatin and adjust as needed.  Check lipid panel today.

## 2024-03-16 NOTE — Assessment & Plan Note (Signed)
 History of low levels, continues daily supplement.  Recheck level today and adjust regimen as needed.

## 2024-03-16 NOTE — Assessment & Plan Note (Signed)
 Chronic, stable.  BP well at goal in office today. Continue Lisinopril  at 40 MG and Amlodipine  10 MG daily as offering benefit. Recommend she monitor BP at least a few mornings a week at home and document.  DASH diet at home.  Labs today: CBC, TSH, CMP and urine ALB.  Urine ALB 09 April 2024, continue ACE for kidney protection. Return in 6 months.

## 2024-03-16 NOTE — Progress Notes (Signed)
 "  BP 122/81 (BP Location: Left Arm, Patient Position: Sitting, Cuff Size: Large)   Pulse 74   Temp 98.2 F (36.8 C) (Oral)   Resp 15   Ht 5' 3.43 (1.611 m)   Wt 205 lb 9.6 oz (93.3 kg)   LMP 02/18/2012   SpO2 98%   BMI 35.93 kg/m    Subjective:    Patient ID: Shannon Marsh, female    DOB: 22-Jan-1966, 59 y.o.   MRN: [id number]  HPI: Shannon Marsh is a 59 y.o. female presenting on 03/16/2024 for comprehensive medical examination. Current medical complaints include:none  She currently lives with: husband Menopausal Symptoms: yes  Has Omeprazole, but takes only as needed for reflux issues.  HYPERTENSION & HYPERLIPIDEMIA Takes Lisinopril  40 MG, Amlodipine  10 MG daily, and Rosuvastatin  10 MG daily (has been off of this for over a month due to no refills). Hypertension status: stable  Satisfied with current treatment? yes Duration of hypertension: chronic BP monitoring frequency: not checking BP range: not checking BP medication side effects:  no Medication compliance: good compliance Aspirin: no Recurrent headaches: no Visual changes: no Palpitations: at baseline has always had occasional palpitations Dyspnea: no Chest pain: no Lower extremity edema: no Dizzy/lightheaded: no  The 10-year ASCVD risk score (Arnett DK, et al., 2019) is: 2.6%   Values used to calculate the score:     Age: 74 years     Clinically relevant sex: Female     Is Non-Hispanic African American: No     Diabetic: No     Tobacco smoker: No     Systolic Blood Pressure: 122 mmHg     Is BP treated: Yes     HDL Cholesterol: 59 mg/dL     Total Cholesterol: 166 mg/dL  Impaired Fasting Glucose Gestational diabetes with pregnancy. HbA1C:  Lab Results  Component Value Date   HGBA1C 6.0 (H) 08/05/2023  Duration of elevated blood sugar: chronic Polydipsia: no Polyuria: no Weight change: no Visual disturbance: no Glucose Monitoring: no    Accucheck frequency: Not Checking    Fasting  glucose:     Post prandial:  Diabetic Education: Not Completed Family history of diabetes: yes -- dad, mother, brother, sister   DEPRESSION Continues Sertraline  100 MG daily, occasionally will take 1/2 a pill only. Continues Vitamin D  for history of low levels Mood status: stable Satisfied with current treatment?: yes Symptom severity: moderate  Duration of current treatment : chronic Side effects: no Medication compliance: good compliance Psychotherapy/counseling: none Depressed mood: no Anxious mood: no Anhedonia: no Significant weight loss or gain: no Insomnia: no Fatigue: no Feelings of worthlessness or guilt: no Impaired concentration/indecisiveness: no Suicidal ideations: no Hopelessness: no Crying spells: no    03/16/2024   10:23 AM 08/05/2023    9:15 AM 02/03/2023    1:13 PM 07/30/2022   10:25 AM 01/29/2022    1:15 PM  Depression screen PHQ 2/9  Decreased Interest 0 0 0 0 0  Down, Depressed, Hopeless 0 0 0 0 0  PHQ - 2 Score 0 0 0 0 0  Altered sleeping 1 1 0 1 1  Tired, decreased energy 0 0 0 0 0  Change in appetite 0 0 1 0 1  Feeling bad or failure about yourself  0 0 0 0 0  Trouble concentrating 0 0 0 0 0  Moving slowly or fidgety/restless 0 0 0 0 0  Suicidal thoughts 0 0 0 0 0  PHQ-9 Score 1 1  1  1  2   Difficult doing work/chores  Not difficult at all Not difficult at all Not difficult at all Not difficult at all     Data saved with a previous flowsheet row definition      03/16/2024   10:23 AM 08/05/2023    9:16 AM 02/03/2023    1:13 PM 07/30/2022   10:25 AM  GAD 7 : Generalized Anxiety Score  Nervous, Anxious, on Edge 0 0 0 0  Control/stop worrying 0 0 0 0  Worry too much - different things 0 0 0 0  Trouble relaxing 0 0 0 0  Restless 0 0 0 0  Easily annoyed or irritable 0 0 0 0  Afraid - awful might happen 0 0 0 0  Total GAD 7 Score 0 0 0 0  Anxiety Difficulty  Not difficult at all Not difficult at all Not difficult at all      01/29/2022     1:15 PM 07/30/2022   10:25 AM 02/03/2023    1:13 PM 08/05/2023    9:15 AM 03/16/2024   10:23 AM  Fall Risk  Falls in the past year? 0 0 0 0 0  Was there an injury with Fall? 0  0  0  0  0  Fall Risk Category Calculator 0 0 0 0 0  Fall Risk Category (Retired) Low       Patient at Risk for Falls Due to No Fall Risks No Fall Risks No Fall Risks No Fall Risks No Fall Risks  Fall risk Follow up Falls evaluation completed  Falls evaluation completed Falls evaluation completed Falls evaluation completed Falls evaluation completed     Data saved with a previous flowsheet row definition    Functional Status Survey: Is the patient deaf or have difficulty hearing?: No Does the patient have difficulty seeing, even when wearing glasses/contacts?: No Does the patient have difficulty concentrating, remembering, or making decisions?: No Does the patient have difficulty walking or climbing stairs?: No Does the patient have difficulty dressing or bathing?: No Does the patient have difficulty doing errands alone such as visiting a doctor's office or shopping?: No    Past Medical History:  Past Medical History:  Diagnosis Date   Bunion, right foot    Gall stones    GERD (gastroesophageal reflux disease)    with spicy foods   Hx gestational diabetes    Hypertension    Obesity (BMI 30.0-34.9)    Pre-diabetes    Varicose vein of leg     Surgical History:  Past Surgical History:  Procedure Laterality Date   ABDOMINAL HYSTERECTOMY  03/23/2012   Procedure: HYSTERECTOMY ABDOMINAL;  Surgeon: Evalene SHAUNNA Organ, MD;  Location: [facility];  Service: Gynecology;  Laterality: N/A;  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   BILATERAL SALPINGECTOMY  03/23/2012   Procedure: BILATERAL SALPINGECTOMY;  Surgeon: Evalene SHAUNNA Organ, MD;  Location: [facility];  Service: Gynecology;  Laterality: Bilateral;   BREAST CYST ASPIRATION Left    neg   BUNIONECTOMY Left 03/25/2018   Procedure: LAPIDUS - TYPE LEFT;   Surgeon: Ashley Soulier, DPM;  Location: [facility];  Service: Podiatry;  Laterality: Left;  LAPIPLASTY SET GENERAL WITH LOCAL   BUNIONECTOMY Left    BUNIONECTOMY Right 02/10/2019   Procedure: LAPIDUS TYPE RIGHT;  Surgeon: Ashley Soulier, DPM;  Location: [facility];  Service: Podiatry;  Laterality: Right;  general with local LAPIPLASTY SET   CHOLECYSTECTOMY  03/09/2014   COLONOSCOPY WITH PROPOFOL  N/A 10/01/2022  Procedure: COLONOSCOPY WITH PROPOFOL ;  Surgeon: Unk Corinn Skiff, MD;  Location: [facility];  Service: Endoscopy;  Laterality: N/A;   POLYPECTOMY  10/01/2022   Procedure: POLYPECTOMY;  Surgeon: Unk Corinn Skiff, MD;  Location: [facility];  Service: Endoscopy;;   TOTAL ABDOMINAL HYSTERECTOMY W/ BILATERAL SALPINGOOPHORECTOMY      Medications:  Current Outpatient Medications on File Prior to Visit  Medication Sig   Acetaminophen  (TYLENOL  8 HOUR PO) Take by mouth as needed.   cholecalciferol (VITAMIN D3) 25 MCG (1000 UNIT) tablet Take 1,000 Units by mouth daily.   Melatonin 1 MG TABS Take 1 mg by mouth as needed.   Multiple Vitamin (MULTIVITAMIN) tablet Take 1 tablet by mouth daily. Occasionally a chewable   omeprazole (PRILOSEC) 20 MG capsule Take 20 mg by mouth daily.   rosuvastatin  (CRESTOR ) 10 MG tablet TAKE 1 TABLET BY MOUTH ONCE EVERY EVENING   No current facility-administered medications on file prior to visit.    Allergies:  No Known Allergies  Social History:  Social History   Socioeconomic History   Marital status: Married    Spouse name: Jerel   Number of children: 2   Years of education: Not on file   Highest education level: Not on file  Occupational History   Not on file  Tobacco Use   Smoking status: Never   Smokeless tobacco: Never  Vaping Use   Vaping status: Never Used  Substance and Sexual Activity   Alcohol use: Yes    Alcohol/week: 3.0 standard drinks of alcohol    Types: 3 Standard drinks or equivalent  per week   Drug use: Never   Sexual activity: Yes    Partners: Male    Birth control/protection: Surgical    Comment: 1st intercourse 59 yo-Fewer than 5 partners  Other Topics Concern   Not on file  Social History Narrative   Not on file   Social Drivers of Health   Tobacco Use: Low Risk (03/16/2024)   Patient History    Smoking Tobacco Use: Never    Smokeless Tobacco Use: Never    Passive Exposure: Not on file  Financial Resource Strain: Low Risk (03/16/2024)   Overall Financial Resource Strain (CARDIA)    Difficulty of Paying Living Expenses: Not hard at all  Food Insecurity: No Food Insecurity (03/16/2024)   Epic    Worried About Radiation Protection Practitioner of Food in the Last Year: Never true    Ran Out of Food in the Last Year: Never true  Transportation Needs: No Transportation Needs (03/16/2024)   Epic    Lack of Transportation (Medical): No    Lack of Transportation (Non-Medical): No  Physical Activity: Inactive (03/16/2024)   Exercise Vital Sign    Days of Exercise per Week: 0 days    Minutes of Exercise per Session: 0 min  Stress: No Stress Concern Present (03/16/2024)   [employer] of Occupational Health - Occupational Stress Questionnaire    Feeling of Stress: Not at all  Social Connections: Moderately Isolated (03/16/2024)   Social Connection and Isolation Panel    Frequency of Communication with Friends and Family: Once a week    Frequency of Social Gatherings with Friends and Family: Three times a week    Attends Religious Services: Never    Active Member of Clubs or Organizations: No    Attends Banker Meetings: Never    Marital Status: Married  Catering Manager Violence: Not At Risk (03/16/2024)   Epic  Fear of Current or Ex-Partner: No    Emotionally Abused: No    Physically Abused: No    Sexually Abused: No  Depression (PHQ2-9): Low Risk (03/16/2024)   Depression (PHQ2-9)    PHQ-2 Score: 1  Alcohol Screen: Low Risk (03/16/2024)   Alcohol Screen    Last  Alcohol Screening Score (AUDIT): 0  Housing: Unknown (03/16/2024)   Epic    Unable to Pay for Housing in the Last Year: No    Number of Times Moved in the Last Year: Not on file    Homeless in the Last Year: No  Utilities: Not At Risk (03/16/2024)   Epic    Threatened with loss of utilities: No  Health Literacy: Adequate Health Literacy (03/16/2024)   B1300 Health Literacy    Frequency of need for help with medical instructions: Never   Social History   Tobacco Use  Smoking Status Never  Smokeless Tobacco Never   Social History   Substance and Sexual Activity  Alcohol Use Yes   Alcohol/week: 3.0 standard drinks of alcohol   Types: 3 Standard drinks or equivalent per week    Family History:  Family History  Problem Relation Age of Onset   Diabetes Mother    Hypertension Mother    Dementia Mother    Diabetes Father    Heart disease Father    Hypertension Sister    Bullous pemphigoid Sister    Hypertension Brother    Skin cancer Brother    Clotting disorder Brother    Hypertension Brother    Atrial fibrillation Brother    Hypertension Brother    Heart attack Brother    Diabetes Brother    Cancer Maternal Grandfather        lung   Other Neg Hx    Breast cancer Neg Hx     Past medical history, surgical history, medications, allergies, family history and social history reviewed with patient today and changes made to appropriate areas of the chart.   Review of Systems - negative All other ROS negative except what is listed above and in the HPI.      Objective:    BP 122/81 (BP Location: Left Arm, Patient Position: Sitting, Cuff Size: Large)   Pulse 74   Temp 98.2 F (36.8 C) (Oral)   Resp 15   Ht 5' 3.43 (1.611 m)   Wt 205 lb 9.6 oz (93.3 kg)   LMP 02/18/2012   SpO2 98%   BMI 35.93 kg/m   Wt Readings from Last 3 Encounters:  03/16/24 205 lb 9.6 oz (93.3 kg)  08/05/23 208 lb (94.3 kg)  02/03/23 206 lb 9.6 oz (93.7 kg)    Physical Exam Vitals and nursing  note reviewed.  Constitutional:      General: She is awake. She is not in acute distress.    Appearance: She is well-developed and well-groomed. She is obese. She is not ill-appearing or toxic-appearing.  HENT:     Head: Normocephalic and atraumatic.     Right Ear: Hearing, tympanic membrane, ear canal and external ear normal. No drainage.     Left Ear: Hearing, tympanic membrane, ear canal and external ear normal. No drainage.     Nose: Nose normal.     Right Sinus: No maxillary sinus tenderness or frontal sinus tenderness.     Left Sinus: No maxillary sinus tenderness or frontal sinus tenderness.     Mouth/Throat:     Mouth: Mucous membranes are moist.  Pharynx: Oropharynx is clear. Uvula midline. No pharyngeal swelling, oropharyngeal exudate or posterior oropharyngeal erythema.  Eyes:     General: Lids are normal.        Right eye: No discharge.        Left eye: No discharge.     Extraocular Movements: Extraocular movements intact.     Conjunctiva/sclera: Conjunctivae normal.     Pupils: Pupils are equal, round, and reactive to light.     Visual Fields: Right eye visual fields normal and left eye visual fields normal.  Neck:     Thyroid: No thyromegaly.     Vascular: No carotid bruit.     Trachea: Trachea normal.  Cardiovascular:     Rate and Rhythm: Normal rate and regular rhythm.     Heart sounds: Normal heart sounds. No murmur heard.    No gallop.  Pulmonary:     Effort: Pulmonary effort is normal. No accessory muscle usage or respiratory distress.     Breath sounds: Normal breath sounds. No decreased breath sounds, wheezing or rales.  Chest:  Breasts:    Right: Normal.     Left: Normal.  Abdominal:     General: Bowel sounds are normal.     Palpations: Abdomen is soft. There is no hepatomegaly or splenomegaly.     Tenderness: There is no abdominal tenderness.  Musculoskeletal:        General: Normal range of motion.     Cervical back: Normal range of motion and  neck supple.     Right lower leg: No edema.     Left lower leg: No edema.  Lymphadenopathy:     Head:     Right side of head: No submental, submandibular, tonsillar, preauricular or posterior auricular adenopathy.     Left side of head: No submental, submandibular, tonsillar, preauricular or posterior auricular adenopathy.     Cervical: No cervical adenopathy.     Upper Body:     Right upper body: No supraclavicular, axillary or pectoral adenopathy.     Left upper body: No supraclavicular, axillary or pectoral adenopathy.  Skin:    General: Skin is warm and dry.     Capillary Refill: Capillary refill takes less than 2 seconds.  Neurological:     Mental Status: She is alert and oriented to person, place, and time.     Gait: Gait is intact.     Deep Tendon Reflexes: Reflexes are normal and symmetric.     Reflex Scores:      Brachioradialis reflexes are 2+ on the right side and 2+ on the left side.      Patellar reflexes are 2+ on the right side and 2+ on the left side. Psychiatric:        Attention and Perception: Attention normal.        Mood and Affect: Mood normal.        Speech: Speech normal.        Behavior: Behavior normal. Behavior is cooperative.        Thought Content: Thought content normal.        Judgment: Judgment normal.     BMI Metric Follow Up - 03/16/24 1027       BMI Metric Follow Up-Please document annually   BMI Metric Follow Up Education provided          Results for orders placed or performed in visit on 08/05/23  Bayer DCA Hb A1c Waived   Collection Time: 08/05/23  9:16 AM  Result Value Ref Range   HB A1C (BAYER DCA - WAIVED) 6.0 (H) 4.8 - 5.6 %  Microalbumin, Urine Waived   Collection Time: 08/05/23  9:16 AM  Result Value Ref Range   Microalb, Ur Waived 30 (H) 0 - 19 mg/L   Creatinine, Urine Waived 200 10 - 300 mg/dL   Microalb/Creat Ratio <30 <30 mg/g  Comprehensive metabolic panel with GFR   Collection Time: 08/05/23  9:16 AM  Result Value  Ref Range   Glucose 118 (H) 70 - 99 mg/dL   BUN 17 6 - 24 mg/dL   Creatinine, Ser 9.29 0.57 - 1.00 mg/dL   eGFR 898 >40 fO/fpw/8.26   BUN/Creatinine Ratio 24 (H) 9 - 23   Sodium 141 134 - 144 mmol/L   Potassium 4.3 3.5 - 5.2 mmol/L   Chloride 103 96 - 106 mmol/L   CO2 20 20 - 29 mmol/L   Calcium  9.7 8.7 - 10.2 mg/dL   Total Protein 6.9 6.0 - 8.5 g/dL   Albumin 4.5 3.8 - 4.9 g/dL   Globulin, Total 2.4 1.5 - 4.5 g/dL   Bilirubin Total 0.3 0.0 - 1.2 mg/dL   Alkaline Phosphatase 103 44 - 121 IU/L   AST 14 0 - 40 IU/L   ALT 13 0 - 32 IU/L  Lipid Panel w/o Chol/HDL Ratio   Collection Time: 08/05/23  9:16 AM  Result Value Ref Range   Cholesterol, Total 166 100 - 199 mg/dL   Triglycerides 828 (H) 0 - 149 mg/dL   HDL 59 >60 mg/dL   VLDL Cholesterol Cal 29 5 - 40 mg/dL   LDL Chol Calc (NIH) 78 0 - 99 mg/dL      Assessment & Plan:   Problem List Items Addressed This Visit       Cardiovascular and Mediastinum   Benign hypertension - Primary   Chronic, stable.  BP well at goal in office today. Continue Lisinopril  at 40 MG and Amlodipine  10 MG daily as offering benefit. Recommend she monitor BP at least a few mornings a week at home and document.  DASH diet at home.  Labs today: CBC, TSH, CMP and urine ALB.  Urine ALB 09 April 2024, continue ACE for kidney protection. Return in 6 months.       Relevant Medications   amLODipine  (NORVASC ) 10 MG tablet   lisinopril  (ZESTRIL ) 40 MG tablet   Other Relevant Orders   CBC with Differential/Platelet   Comprehensive metabolic panel with GFR   TSH   Microalbumin, Urine Waived     Digestive   Gastroesophageal reflux disease without esophagitis   Chronic, stable.  Continue Omeprazole as needed, does not use often.  Risks of PPI use were discussed with patient including bone loss, C. Diff diarrhea, pneumonia, infections, CKD, electrolyte abnormalities.  Verbalizes understanding and chooses to continue the medication.       Relevant Orders    Magnesium     Endocrine   IFG (impaired fasting glucose)   History of gestational diabetes, A1c 6% last visit, mild trend up.  Start medication as needed. Will recheck today. Has significant family history of diabetes.      Relevant Orders   HgB A1c     Other   Vitamin D  deficiency   History of low levels, continues daily supplement.  Recheck level today and adjust regimen as needed.      Relevant Orders   VITAMIN D  25 Hydroxy (Vit-D Deficiency, Fractures)   Situational depression  Chronic, stable with Sertraline  at 100 MG.  Continue this current dose and will adjust in future, possibly reduce if improvement in symptoms and stressors.  Denies SI/HI.        Relevant Medications   sertraline  (ZOLOFT ) 100 MG tablet   Obesity   BMI 35.93.  Recommended eating smaller high protein, low fat meals more frequently and exercising 30 mins a day 5 times a week with a goal of 10-15lb weight loss in the next 3 months. Patient voiced their understanding and motivation to adhere to these recommendations.       Mixed hyperlipidemia   Chronic, ongoing.  Continue Rosuvastatin  and adjust as needed.  Check lipid panel today.      Relevant Medications   amLODipine  (NORVASC ) 10 MG tablet   lisinopril  (ZESTRIL ) 40 MG tablet   Other Relevant Orders   Comprehensive metabolic panel with GFR   Lipid Panel w/o Chol/HDL Ratio   Other Visit Diagnoses       Encounter for annual physical exam       Annual physical today with labs and health maintenance reviewed, discussed with patient.        Follow up plan: Return in about 6 months (around 09/13/2024) for HTN/HLD, Depression, IFG.   LABORATORY TESTING:  - Pap smear: not applicable  IMMUNIZATIONS:   - Tetanus vaccination status reviewed: last tetanus booster within 10 years. - Influenza: refuses today - Pneumovax: Not applicable - Prevnar: Not applicable - HPV: Not applicable - Zostavax vaccine: Up To Date - Covid: Had 1st two, no  further (refuses)  SCREENING: -Mammogram: Up To Date -- due next 07/30/24 - Colonoscopy: Up To Date -- due next 09/30/2025 - Bone Density: Not applicable  -Hearing Test: Not applicable  -Spirometry: Not applicable   PATIENT COUNSELING:   Advised to take 1 mg of folate supplement per day if capable of pregnancy.   Sexuality: Discussed sexually transmitted diseases, partner selection, use of condoms, avoidance of unintended pregnancy  and contraceptive alternatives.   Advised to avoid cigarette smoking.  I discussed with the patient that most people either abstain from alcohol or drink within safe limits (<=14/week and <=4 drinks/occasion for males, <=7/weeks and <= 3 drinks/occasion for females) and that the risk for alcohol disorders and other health effects rises proportionally with the number of drinks per week and how often a drinker exceeds daily limits.  Discussed cessation/primary prevention of drug use and availability of treatment for abuse.   Diet: Encouraged to adjust caloric intake to maintain  or achieve ideal body weight, to reduce intake of dietary saturated fat and total fat, to limit sodium intake by avoiding high sodium foods and not adding table salt, and to maintain adequate dietary potassium and calcium  preferably from fresh fruits, vegetables, and low-fat dairy products.    Stressed the importance of regular exercise  Injury prevention: Discussed safety belts, safety helmets, smoke detector, smoking near bedding or upholstery.   Dental health: Discussed importance of regular tooth brushing, flossing, and dental visits.    NEXT PREVENTATIVE PHYSICAL DUE IN 1 YEAR. Return in about 6 months (around 09/13/2024) for HTN/HLD, Depression, IFG.          "

## 2024-03-16 NOTE — Assessment & Plan Note (Signed)
BMI 35.93.  Recommended eating smaller high protein, low fat meals more frequently and exercising 30 mins a day 5 times a week with a goal of 10-15lb weight loss in the next 3 months. Patient voiced their understanding and motivation to adhere to these recommendations.  

## 2024-03-16 NOTE — Assessment & Plan Note (Signed)
 Chronic, stable.  Continue Omeprazole as needed, does not use often.  Risks of PPI use were discussed with patient including bone loss, C. Diff diarrhea, pneumonia, infections, CKD, electrolyte abnormalities.  Verbalizes understanding and chooses to continue the medication.

## 2024-03-16 NOTE — Assessment & Plan Note (Signed)
 History of gestational diabetes, A1c 6% last visit, mild trend up.  Start medication as needed. Will recheck today. Has significant family history of diabetes.

## 2024-03-16 NOTE — Assessment & Plan Note (Signed)
 Chronic, stable with Sertraline at 100 MG.  Continue this current dose and will adjust in future, possibly reduce if improvement in symptoms and stressors.  Denies SI/HI.

## 2024-03-17 ENCOUNTER — Ambulatory Visit: Payer: Self-pay | Admitting: Nurse Practitioner

## 2024-03-17 LAB — CBC WITH DIFFERENTIAL/PLATELET
Basophils Absolute: 0.1 x10E3/uL (ref 0.0–0.2)
Basos: 1 %
EOS (ABSOLUTE): 0.2 x10E3/uL (ref 0.0–0.4)
Eos: 3 %
Hematocrit: 43.1 % (ref 34.0–46.6)
Hemoglobin: 14.2 g/dL (ref 11.1–15.9)
Immature Grans (Abs): 0 x10E3/uL (ref 0.0–0.1)
Immature Granulocytes: 0 %
Lymphocytes Absolute: 1.9 x10E3/uL (ref 0.7–3.1)
Lymphs: 29 %
MCH: 29.8 pg (ref 26.6–33.0)
MCHC: 32.9 g/dL (ref 31.5–35.7)
MCV: 91 fL (ref 79–97)
Monocytes Absolute: 0.5 x10E3/uL (ref 0.1–0.9)
Monocytes: 7 %
Neutrophils Absolute: 4 x10E3/uL (ref 1.4–7.0)
Neutrophils: 60 %
Platelets: 314 x10E3/uL (ref 150–450)
RBC: 4.76 x10E6/uL (ref 3.77–5.28)
RDW: 12.5 % (ref 11.7–15.4)
WBC: 6.7 x10E3/uL (ref 3.4–10.8)

## 2024-03-17 LAB — COMPREHENSIVE METABOLIC PANEL WITH GFR
ALT: 10 IU/L (ref 0–32)
AST: 13 IU/L (ref 0–40)
Albumin: 4.4 g/dL (ref 3.8–4.9)
Alkaline Phosphatase: 93 IU/L (ref 49–135)
BUN/Creatinine Ratio: 25 — ABNORMAL HIGH (ref 9–23)
BUN: 19 mg/dL (ref 6–24)
Bilirubin Total: 0.4 mg/dL (ref 0.0–1.2)
CO2: 23 mmol/L (ref 20–29)
Calcium: 9.8 mg/dL (ref 8.7–10.2)
Chloride: 102 mmol/L (ref 96–106)
Creatinine, Ser: 0.75 mg/dL (ref 0.57–1.00)
Globulin, Total: 2.3 g/dL (ref 1.5–4.5)
Glucose: 110 mg/dL — ABNORMAL HIGH (ref 70–99)
Potassium: 4.5 mmol/L (ref 3.5–5.2)
Sodium: 140 mmol/L (ref 134–144)
Total Protein: 6.7 g/dL (ref 6.0–8.5)
eGFR: 92 mL/min/1.73

## 2024-03-17 LAB — VITAMIN D 25 HYDROXY (VIT D DEFICIENCY, FRACTURES): Vit D, 25-Hydroxy: 32.5 ng/mL (ref 30.0–100.0)

## 2024-03-17 LAB — HEMOGLOBIN A1C
Est. average glucose Bld gHb Est-mCnc: 123 mg/dL
Hgb A1c MFr Bld: 5.9 % — ABNORMAL HIGH (ref 4.8–5.6)

## 2024-03-17 LAB — LIPID PANEL W/O CHOL/HDL RATIO
Cholesterol, Total: 265 mg/dL — ABNORMAL HIGH (ref 100–199)
HDL: 55 mg/dL
LDL Chol Calc (NIH): 185 mg/dL — ABNORMAL HIGH (ref 0–99)
Triglycerides: 138 mg/dL (ref 0–149)
VLDL Cholesterol Cal: 25 mg/dL (ref 5–40)

## 2024-03-17 LAB — MAGNESIUM: Magnesium: 2.3 mg/dL (ref 1.6–2.3)

## 2024-03-17 LAB — TSH: TSH: 2.95 u[IU]/mL (ref 0.450–4.500)

## 2024-03-17 MED ORDER — ROSUVASTATIN CALCIUM 10 MG PO TABS: 10.0000 mg | ORAL_TABLET | Freq: Every day | ORAL | 3 refills | Status: AC

## 2024-03-17 NOTE — Progress Notes (Signed)
 Contacted via MyChart  Good afternoon Shannon Marsh, your labs have returned: - Kidney function, creatinine and eGFR, remains normal, as is liver function, AST and ALT.  - Lipid panel is significantly elevated without statin on board. I recommend restarting this and I will send in refills. - A1c remains in prediabetic range, but did trend down a little. - Remainder of labs overall stable. Any questions? Keep being amazing!!  Thank you for allowing me to participate in your care.  I appreciate you. Kindest regards, Lashawndra Lampkins

## 2024-09-06 ENCOUNTER — Ambulatory Visit: Admitting: Nurse Practitioner
